# Patient Record
Sex: Male | Born: 1980 | Race: Black or African American | Hispanic: No | Marital: Single | Smoking: Current every day smoker
Health system: Southern US, Community
[De-identification: ages and names within clinical notes are randomized; demographics above are authoritative.]

## PROBLEM LIST (undated history)

## (undated) DIAGNOSIS — Y249XXA Unspecified firearm discharge, undetermined intent, initial encounter: Secondary | ICD-10-CM

## (undated) DIAGNOSIS — J45909 Unspecified asthma, uncomplicated: Secondary | ICD-10-CM

## (undated) DIAGNOSIS — J40 Bronchitis, not specified as acute or chronic: Secondary | ICD-10-CM

## (undated) DIAGNOSIS — I509 Heart failure, unspecified: Secondary | ICD-10-CM

## (undated) DIAGNOSIS — G589 Mononeuropathy, unspecified: Secondary | ICD-10-CM

## (undated) DIAGNOSIS — IMO0002 Reserved for concepts with insufficient information to code with codable children: Secondary | ICD-10-CM

## (undated) DIAGNOSIS — W3400XA Accidental discharge from unspecified firearms or gun, initial encounter: Secondary | ICD-10-CM

## (undated) HISTORY — PX: APPENDECTOMY: SHX54

## (undated) HISTORY — PX: TONSILLECTOMY: SUR1361

---

## 1998-04-10 ENCOUNTER — Encounter: Payer: Self-pay | Admitting: Emergency Medicine

## 1998-04-10 ENCOUNTER — Emergency Department (HOSPITAL_COMMUNITY): Admission: EM | Admit: 1998-04-10 | Discharge: 1998-04-10 | Payer: Self-pay | Admitting: Emergency Medicine

## 1998-08-06 ENCOUNTER — Encounter: Payer: Self-pay | Admitting: Emergency Medicine

## 1998-08-06 ENCOUNTER — Emergency Department (HOSPITAL_COMMUNITY): Admission: EM | Admit: 1998-08-06 | Discharge: 1998-08-06 | Payer: Self-pay | Admitting: Emergency Medicine

## 1998-08-08 ENCOUNTER — Emergency Department (HOSPITAL_COMMUNITY): Admission: EM | Admit: 1998-08-08 | Discharge: 1998-08-08 | Payer: Self-pay | Admitting: Emergency Medicine

## 1998-12-04 ENCOUNTER — Emergency Department (HOSPITAL_COMMUNITY): Admission: EM | Admit: 1998-12-04 | Discharge: 1998-12-04 | Payer: Self-pay | Admitting: Emergency Medicine

## 1998-12-04 ENCOUNTER — Encounter: Payer: Self-pay | Admitting: Emergency Medicine

## 2001-09-10 ENCOUNTER — Emergency Department (HOSPITAL_COMMUNITY): Admission: EM | Admit: 2001-09-10 | Discharge: 2001-09-10 | Payer: Self-pay | Admitting: Emergency Medicine

## 2006-04-04 ENCOUNTER — Emergency Department (HOSPITAL_COMMUNITY): Admission: EM | Admit: 2006-04-04 | Discharge: 2006-04-04 | Payer: Self-pay | Admitting: Emergency Medicine

## 2006-05-18 ENCOUNTER — Emergency Department (HOSPITAL_COMMUNITY): Admission: EM | Admit: 2006-05-18 | Discharge: 2006-05-18 | Payer: Self-pay | Admitting: Emergency Medicine

## 2010-07-02 ENCOUNTER — Emergency Department (HOSPITAL_COMMUNITY)
Admission: EM | Admit: 2010-07-02 | Discharge: 2010-07-02 | Disposition: A | Discharge status: Home / Self Care | Payer: Self-pay | Attending: Emergency Medicine | Admitting: Emergency Medicine

## 2010-07-02 DIAGNOSIS — M79609 Pain in unspecified limb: Secondary | ICD-10-CM | POA: Insufficient documentation

## 2010-07-02 DIAGNOSIS — R21 Rash and other nonspecific skin eruption: Secondary | ICD-10-CM | POA: Insufficient documentation

## 2010-07-02 DIAGNOSIS — L851 Acquired keratosis [keratoderma] palmaris et plantaris: Secondary | ICD-10-CM | POA: Insufficient documentation

## 2010-07-02 DIAGNOSIS — F17200 Nicotine dependence, unspecified, uncomplicated: Secondary | ICD-10-CM | POA: Insufficient documentation

## 2010-07-02 LAB — POCT I-STAT, CHEM 8
BUN: 9 mg/dL (ref 6–23)
Calcium, Ion: 1.03 mmol/L — ABNORMAL LOW (ref 1.12–1.32)
Chloride: 104 mEq/L (ref 96–112)
Creatinine, Ser: 1 mg/dL (ref 0.4–1.5)
Glucose, Bld: 107 mg/dL — ABNORMAL HIGH (ref 70–99)
HCT: 46 % (ref 39.0–52.0)
Hemoglobin: 15.6 g/dL (ref 13.0–17.0)
Potassium: 4.8 mEq/L (ref 3.5–5.1)
Sodium: 140 mEq/L (ref 135–145)
TCO2: 26 mmol/L (ref 0–100)

## 2010-07-02 LAB — RPR: RPR Ser Ql: NONREACTIVE

## 2010-07-03 LAB — ROCKY MTN SPOTTED FVR AB, IGM-BLOOD: RMSF IgM: 0.19 IV (ref 0.00–0.89)

## 2010-07-03 LAB — ROCKY MTN SPOTTED FVR AB, IGG-BLOOD: RMSF IgG: 0.29 IV

## 2010-07-04 ENCOUNTER — Observation Stay (HOSPITAL_COMMUNITY)
Admission: EM | Admit: 2010-07-04 | Discharge: 2010-07-05 | DRG: 547 | Disposition: A | Discharge status: Home Health | Payer: Self-pay | Attending: Internal Medicine | Admitting: Internal Medicine

## 2010-07-04 DIAGNOSIS — M7989 Other specified soft tissue disorders: Principal | ICD-10-CM | POA: Insufficient documentation

## 2010-07-04 DIAGNOSIS — R21 Rash and other nonspecific skin eruption: Secondary | ICD-10-CM | POA: Insufficient documentation

## 2010-07-04 DIAGNOSIS — F17200 Nicotine dependence, unspecified, uncomplicated: Secondary | ICD-10-CM | POA: Insufficient documentation

## 2010-07-04 DIAGNOSIS — J45909 Unspecified asthma, uncomplicated: Secondary | ICD-10-CM | POA: Insufficient documentation

## 2010-07-04 LAB — URINALYSIS, ROUTINE W REFLEX MICROSCOPIC
Glucose, UA: NEGATIVE mg/dL
Protein, ur: NEGATIVE mg/dL
Specific Gravity, Urine: 1.019 (ref 1.005–1.030)
pH: 5.5 (ref 5.0–8.0)

## 2010-07-04 LAB — DIC (DISSEMINATED INTRAVASCULAR COAGULATION)PANEL
Fibrinogen: 605 mg/dL — ABNORMAL HIGH (ref 204–475)
INR: 1.05 (ref 0.00–1.49)
Smear Review: NONE SEEN
aPTT: 27 seconds (ref 24–37)

## 2010-07-04 LAB — COMPREHENSIVE METABOLIC PANEL
BUN: 7 mg/dL (ref 6–23)
Calcium: 8.5 mg/dL (ref 8.4–10.5)
Creatinine, Ser: 0.9 mg/dL (ref 0.4–1.5)
Glucose, Bld: 114 mg/dL — ABNORMAL HIGH (ref 70–99)
Total Protein: 7.5 g/dL (ref 6.0–8.3)

## 2010-07-04 LAB — HEPATIC FUNCTION PANEL
AST: 34 U/L (ref 0–37)
Albumin: 3.5 g/dL (ref 3.5–5.2)
Total Bilirubin: 0.5 mg/dL (ref 0.3–1.2)

## 2010-07-04 LAB — URINE MICROSCOPIC-ADD ON

## 2010-07-04 LAB — DIFFERENTIAL
Basophils Relative: 1 % (ref 0–1)
Eosinophils Absolute: 0.2 10*3/uL (ref 0.0–0.7)
Eosinophils Relative: 2 % (ref 0–5)
Lymphs Abs: 1.8 10*3/uL (ref 0.7–4.0)
Monocytes Absolute: 0.7 10*3/uL (ref 0.1–1.0)

## 2010-07-04 LAB — RAPID URINE DRUG SCREEN, HOSP PERFORMED
Amphetamines: NOT DETECTED
Barbiturates: NOT DETECTED

## 2010-07-04 LAB — CBC
HCT: 38.9 % — ABNORMAL LOW (ref 39.0–52.0)
MCHC: 33.4 g/dL (ref 30.0–36.0)
MCV: 96.3 fL (ref 78.0–100.0)
Platelets: 323 10*3/uL (ref 150–400)
RDW: 14 % (ref 11.5–15.5)

## 2010-07-05 LAB — RPR: RPR Ser Ql: NONREACTIVE

## 2010-07-05 LAB — C-REACTIVE PROTEIN: CRP: 9.9 mg/dL — ABNORMAL HIGH (ref <0.6)

## 2010-07-05 LAB — HIV ANTIBODY (ROUTINE TESTING W REFLEX): HIV: NONREACTIVE

## 2010-07-07 LAB — HIV-1 RNA QUANT-NO REFLEX-BLD: HIV 1 RNA Quant: <20 copies/mL (ref <20)

## 2010-07-16 NOTE — H&P (Signed)
NAME:  GEORG, ANG NO.:  [id number]  MEDICAL RECORD NO.:  [id number]           PATIENT TYPE:  E  LOCATION:  [facility]                         FACILITY:  [place]  PHYSICIAN:  Alphia Moh, MD   DATE OF BIRTH:  01/18/1981  DATE OF ADMISSION:  07/04/2010 DATE OF DISCHARGE:                             HISTORY & PHYSICAL   PRIMARY CARE PHYSICIAN:  Unassigned.  CHIEF COMPLAINT:  Pain in bilateral feet and rash.  HISTORY OF PRESENT ILLNESS:  The patient is a 30 year old gentleman with past medical history only significant for asthma who presents with approximately 3-4 days of pain in bilateral feet along with the rash. He initially went to [facility] 4 days prior to admission after developing pain in the plantar surface of the left foot along with a few spots of a rash in bilateral palmar hands.  He was evaluated at the time of [facility], and had a metabolic panel along with an RPR to assess for syphilis and a rickettsia, [facility] spotted fever panel, and was sent out with Percocet and doxycycline.  The patient reports that since then symptoms have been getting worse with leading to bilateral foot pain as opposed to only the left foot and the petechial/purpuric rash is now involving both plantar surfaces of his feet as well as bilateral palms.  Upon review of systems, the patient reports having had a sore throat approximately a week prior to this happening.  Otherwise he denies any other symptoms.  Of note, he denies any genitourinary symptoms.  He is married and denies any promiscuous behavior, he denies any travel, he denies any known insect bites. However, [place] and it is possible especially since his grandmother's house apparently has some bedbugs.  PAST MEDICAL HISTORY:  Asthma.  MEDICATIONS: 1. Doxycycline 100 mg p.o. b.i.d. prescribed approximately 3 days ago. 2. Percocet 5/325 p.o. p.r.n.  pain.  SOCIAL HISTORY:  The patient is married.  He smokes a half-a-pack per day.  He denies any alcohol use.  He does use marijuana, but denies any other drug use, specifically cocaine or anything injected.  FAMILY HISTORY:  Noncontributory.  REVIEW OF SYSTEMS:  As per HPI.  All others reviewed negative.  PHYSICAL EXAMINATION:  VITAL SIGNS:  Temperature 98.4, blood pressure 160/93, now 109/75, heart rate of 116 initially, now 88, respirations 18, oxygen saturation 96-97% on room air. GENERAL:  This is a young obese man lying in bed in no acute distress. HEENT:  Head is normocephalic, atraumatic.  Pupils are equal, round, and reactive to light and accommodation.  Extraocular movements are intact. Sclerae anicteric.  Mucous membranes are moist.  There is right-sided angular cheilitis at the corner of the mouth.  There are some purulent- appearing tonsillar exudates noted. NECK:  Supple.  There is no lymphadenopathy.  There is no JVD.  There is no carotid bruits. HEART:  There is a normal S1 and S2 with a regular rate and rhythm. There is no murmurs, gallops, or rubs. LUNGS:  Good air movement bilaterally and clear to auscultation.  ABDOMEN:  Positive bowel sounds, obese, soft, nontender, nondistended. EXTREMITIES:  There is no cyanosis, clubbing, or edema.  There is very thick and calloused skin bilateral plantar surfaces with some cracks. NEUROLOGIC:  The patient is awake, alert, and oriented x3.  Cranial nerves II-XII are intact.  Motor is intact.  Sensation is intact.  SKIN: The patient has purpuric rash that is palpable in bilateral plantar surfaces as well as palmar surfaces.  There is a more fine maculopapular rash in the distal lower extremities from the knees to the ankles.  LABORATORY DATA:  Sodium 137, potassium 3.9, chloride 102, bicarb 26, glucose 114, BUN 7, creatinine 0.9, total bilirubin 1.1, alkaline phosphatase 77, AST 44, ALT 30, total protein 7.5, albumin 3.5,  calcium 8.5, WBC 8.8, hemoglobin 13.0, platelets 323 with a normal differential. PT 13.9, INR 1.05, PTT 27, D-dimer 0.325, fibrinogen is 605.  ASSESSMENT AND PLAN: 1. Purpuric rash, this is currently of unclear etiology.  The patient     has had an initial workup which I feel is unlikely to be the cause.     RPR was negative, although this may need to be further diluted to     ensure that it is negative, this is an acute infection, however, I     do not think this is actually consistent given his pain associated     with the rash.  Furthermore, it does not appear to be ricketssial-     type infection and his initial [facility] spotted fever panel     was negative.  Furthermore, his platelets are within normal limits     ruling out any platelet diathesis, moreover his mucous membranes do     not have any lesions.  At this point, we will target vasculitic     process and we will go ahead and check a sed rate and CRP (his     fibrinogen is 605 which is elevated unlikely in the setting of an     acute inflammatory process).  We will go ahead and check an ANA as     well as the ANCA for other autoimmune diseases as a potential    social vasculitis.  We will go ahead and check an IgA level     although this would be very nonspecific and be elevated in     vasculitides such as Henoch-Schonlein purpura again this is     unlikely.  We will go ahead and check an HIV antibody along with     PCR.  The patient reported a sore throat approximately a week     before symptoms began and we will go ahead and check an anti-     streptolysin O antibody to confirm that this is in post     streptococcal.  The patient's AST is slightly elevated at 44, I     doubt that this is secondary to hepatitis, but we will check an     acute hepatitis panel along with a cryoglobulins. 2. Elevated transaminases.  Again as mentioned above, AST is minimally     elevated at 44.  We will workup as per #1. 3. Elevated  fibrinogen, again this is as per #1. 4. Slightly elevated glucose, glucose level is currently 114, which is     not significantly high.  We will not pursue any further workup. 5. The patient's wife's name is Caryl Pina, and she is the one to be     contacted in case of  need for medical decision making, phone number     is [phone].     Alphia Moh, MD     MA/MEDQ  D:  07/04/2010  T:  07/04/2010  Job:  [id number]  Electronically Signed by Alphia Moh MD on 07/16/2010 10:30:43 AM

## 2012-10-10 ENCOUNTER — Emergency Department (HOSPITAL_COMMUNITY)
Admission: EM | Admit: 2012-10-10 | Discharge: 2012-10-10 | Disposition: A | Discharge status: Home / Self Care | Payer: Self-pay | Attending: Emergency Medicine | Admitting: Emergency Medicine

## 2012-10-10 ENCOUNTER — Encounter (HOSPITAL_COMMUNITY): Payer: Self-pay | Admitting: *Deleted

## 2012-10-10 DIAGNOSIS — IMO0002 Reserved for concepts with insufficient information to code with codable children: Secondary | ICD-10-CM | POA: Insufficient documentation

## 2012-10-10 DIAGNOSIS — Z87828 Personal history of other (healed) physical injury and trauma: Secondary | ICD-10-CM | POA: Insufficient documentation

## 2012-10-10 DIAGNOSIS — IMO0001 Reserved for inherently not codable concepts without codable children: Secondary | ICD-10-CM | POA: Insufficient documentation

## 2012-10-10 DIAGNOSIS — R209 Unspecified disturbances of skin sensation: Secondary | ICD-10-CM | POA: Insufficient documentation

## 2012-10-10 DIAGNOSIS — M5416 Radiculopathy, lumbar region: Secondary | ICD-10-CM

## 2012-10-10 DIAGNOSIS — F17200 Nicotine dependence, unspecified, uncomplicated: Secondary | ICD-10-CM | POA: Insufficient documentation

## 2012-10-10 MED ORDER — HYDROCODONE-ACETAMINOPHEN 5-325 MG PO TABS: 1.0000 | ORAL_TABLET | Freq: Four times a day (QID) | ORAL | Status: DC | PRN

## 2012-10-10 MED ORDER — OXYCODONE-ACETAMINOPHEN 5-325 MG PO TABS
1.0000 | ORAL_TABLET | Freq: Once | ORAL | Status: AC
  Administered 2012-10-10: 1 via ORAL
  Filled 2012-10-10: qty 1

## 2012-10-10 MED ORDER — PREDNISONE 10 MG PO TABS: ORAL_TABLET | ORAL | Status: DC

## 2012-10-10 MED ORDER — PREDNISONE 20 MG PO TABS
60.0000 mg | ORAL_TABLET | Freq: Once | ORAL | Status: AC
  Administered 2012-10-10: 60 mg via ORAL
  Filled 2012-10-10: qty 3

## 2012-10-10 MED ORDER — NAPROXEN 500 MG PO TABS: 500.0000 mg | ORAL_TABLET | Freq: Two times a day (BID) | ORAL | Status: DC

## 2012-10-10 NOTE — ED Notes (Signed)
Pt dc'd home w/all belongings, alert and ambulatory upon dc, 3 new rx prescribed, pt verbalizes understanding of dc instructions, driven home by friend

## 2012-10-10 NOTE — ED Notes (Signed)
Pt c/o Right lateral thigh pain, numbness and burning "for a couple of years." pt reports increase pain to the Right leg. 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

## 2012-10-10 NOTE — ED Notes (Signed)
The pt has had pain in his rt thigh for YEARS.  No new pain except it is getting worse

## 2012-10-10 NOTE — ED Provider Notes (Signed)
History  This chart was scribed for non-physician practitioner Jeannett Senior, PA-C, working with Richarda Blade, MD, by Neta Ehlers, ED Scribe. This patient was seen in room TR10C/TR10C and the patient's care was started at 10:30 PM.  CSN: [id number] Arrival date & time 10/10/12  2131  First MD Initiated Contact with Patient 10/10/12 2158     Chief Complaint  Patient presents with  . thigh pain  for years     The history is provided by the patient. No language interpreter was used.   HPI Comments: Joe Carter is a 32 y.o. male who presents to the Emergency Department complaining of pain to his right thigh which he has experienced for four years but has recently worsened.  He states that his thigh is numb, and that he also experiences intermittent episodes of sharp pains. He describes the pain as "burning." He denies back pain, urinary or bowel incontinency, and a fever. 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 Did not seek treatment for this.   History reviewed. No pertinent past medical history. History reviewed. No pertinent past surgical history. No family history on file. History  Substance Use Topics  . Smoking status: Current Every Day Smoker  . Smokeless tobacco: Not on file  . Alcohol Use: Yes    Review of Systems  Constitutional: Negative for fever.  Genitourinary: Negative for urgency.  Musculoskeletal: Positive for myalgias. Negative for back pain.  All other systems reviewed and are negative.    Allergies  Review of patient's allergies indicates no known allergies.  Home Medications  No current outpatient prescriptions on file.  Triage Vitals: BP 138/73  Pulse 84  Temp(Src) 98.1 F (36.7 C) (Oral)  Resp 18  SpO2 96%  Physical Exam  Nursing note and vitals reviewed. Constitutional: He is oriented to person, place, and time. He appears  well-developed and well-nourished. No distress.  HENT:  Head: Normocephalic and atraumatic.  Eyes: EOM are normal.  Neck: Neck supple. No tracheal deviation present.  Cardiovascular: Normal rate.   Pulmonary/Chest: Effort normal. No respiratory distress.  Musculoskeletal: Normal range of motion.  No midline lumbar tenderness. Noperivertebral tenderness. Normal right hip with no tenderness to palpation, full ROM. Normal right knee. Dorsal pedal pulses intact. No calf swelling or tenderness.   Neurological: He is alert and oriented to person, place, and time.  Decreased sensation over right lateral thigh. Distal sensations intact.  5/5 strength in right lower extremity. Good strength with foot dorsal flexion and plantar flexion.    Skin: Skin is warm and dry.  Psychiatric: He has a normal mood and affect. His behavior is normal.    ED Course  Procedures (including critical care time)  DIAGNOSTIC STUDIES: Oxygen Saturation is 96% on room air, normal by my interpretation.    COORDINATION OF CARE:  10:35 PM-Discussed treatment plan with patient which includes pain medication and prednisone, and the patient agreed to the plan. Also advised the pt to exercise and lose weight. Informed pt that he should find a PCP.   Labs Reviewed - No data to display No results found. 1. Lumbar radiculopathy, chronic     MDM  Pt with right buttock and thigh pain for 4 years. Exam consistent with radiculopathy. No LE swelling, no calf pain or tenderness, no pain with ROM of the hip. Doubt DVT. No numbness or weakness in distal  leg. No signs of cuada equina. Will treat with NSAIDs, prednisone, norco. Follow up with pcp.   Filed Vitals:   10/10/12 2138  BP: 138/73  Pulse: 84  Temp: 98.1 F (36.7 C)  TempSrc: Oral  Resp: 18  SpO2: 96%    I personally performed the services described in this documentation, which was scribed in my presence. The recorded information has been reviewed and is  accurate.    Renold Genta, PA-C 10/11/12 0004

## 2012-10-11 NOTE — ED Provider Notes (Signed)
Medical screening examination/treatment/procedure(s) were performed by non-physician practitioner and as supervising physician I was immediately available for consultation/collaboration.  Richarda Blade, MD 10/11/12 [phone]

## 2013-06-09 ENCOUNTER — Encounter (HOSPITAL_COMMUNITY): Payer: Self-pay | Admitting: Emergency Medicine

## 2013-06-09 ENCOUNTER — Emergency Department (HOSPITAL_COMMUNITY)
Admission: EM | Admit: 2013-06-09 | Discharge: 2013-06-09 | Disposition: A | Discharge status: Home / Self Care | Payer: BC Managed Care – PPO | Attending: Emergency Medicine | Admitting: Emergency Medicine

## 2013-06-09 DIAGNOSIS — F17200 Nicotine dependence, unspecified, uncomplicated: Secondary | ICD-10-CM | POA: Insufficient documentation

## 2013-06-09 DIAGNOSIS — R1031 Right lower quadrant pain: Secondary | ICD-10-CM | POA: Insufficient documentation

## 2013-06-09 DIAGNOSIS — Z791 Long term (current) use of non-steroidal anti-inflammatories (NSAID): Secondary | ICD-10-CM | POA: Insufficient documentation

## 2013-06-09 DIAGNOSIS — R197 Diarrhea, unspecified: Secondary | ICD-10-CM | POA: Insufficient documentation

## 2013-06-09 DIAGNOSIS — J45909 Unspecified asthma, uncomplicated: Secondary | ICD-10-CM | POA: Insufficient documentation

## 2013-06-09 DIAGNOSIS — R109 Unspecified abdominal pain: Secondary | ICD-10-CM

## 2013-06-09 HISTORY — DX: Bronchitis, not specified as acute or chronic: J40

## 2013-06-09 HISTORY — DX: Unspecified asthma, uncomplicated: J45.909

## 2013-06-09 LAB — URINALYSIS, ROUTINE W REFLEX MICROSCOPIC
BILIRUBIN URINE: NEGATIVE
GLUCOSE, UA: NEGATIVE mg/dL
HGB URINE DIPSTICK: NEGATIVE
KETONES UR: NEGATIVE mg/dL
Nitrite: NEGATIVE
PROTEIN: NEGATIVE mg/dL
Specific Gravity, Urine: 1.031 — ABNORMAL HIGH (ref 1.005–1.030)
Urobilinogen, UA: 1 mg/dL (ref 0.0–1.0)
pH: 6 (ref 5.0–8.0)

## 2013-06-09 LAB — CBC WITH DIFFERENTIAL/PLATELET
BASOS ABS: 0 10*3/uL (ref 0.0–0.1)
BASOS PCT: 0 % (ref 0–1)
EOS ABS: 0.1 10*3/uL (ref 0.0–0.7)
Eosinophils Relative: 1 % (ref 0–5)
HCT: 40.6 % (ref 39.0–52.0)
HEMOGLOBIN: 13.6 g/dL (ref 13.0–17.0)
Lymphocytes Relative: 28 % (ref 12–46)
Lymphs Abs: 2.3 10*3/uL (ref 0.7–4.0)
MCH: 32.9 pg (ref 26.0–34.0)
MCHC: 33.5 g/dL (ref 30.0–36.0)
MCV: 98.1 fL (ref 78.0–100.0)
Monocytes Absolute: 0.5 10*3/uL (ref 0.1–1.0)
Monocytes Relative: 6 % (ref 3–12)
NEUTROS ABS: 5.1 10*3/uL (ref 1.7–7.7)
NEUTROS PCT: 64 % (ref 43–77)
PLATELETS: 271 10*3/uL (ref 150–400)
RBC: 4.14 MIL/uL — ABNORMAL LOW (ref 4.22–5.81)
RDW: 13.9 % (ref 11.5–15.5)
WBC: 8 10*3/uL (ref 4.0–10.5)

## 2013-06-09 LAB — COMPREHENSIVE METABOLIC PANEL WITH GFR
ALT: 18 U/L (ref 0–53)
AST: 19 U/L (ref 0–37)
Albumin: 3.5 g/dL (ref 3.5–5.2)
Alkaline Phosphatase: 64 U/L (ref 39–117)
BUN: 9 mg/dL (ref 6–23)
CO2: 30 meq/L (ref 19–32)
Calcium: 9.2 mg/dL (ref 8.4–10.5)
Chloride: 102 meq/L (ref 96–112)
Creatinine, Ser: 0.76 mg/dL (ref 0.50–1.35)
GFR calc Af Amer: >90 mL/min (ref >90)
GFR calc non Af Amer: >90 mL/min (ref >90)
Glucose, Bld: 102 mg/dL — ABNORMAL HIGH (ref 70–99)
Potassium: 4.1 meq/L (ref 3.7–5.3)
Sodium: 143 meq/L (ref 137–147)
Total Bilirubin: 0.4 mg/dL (ref 0.3–1.2)
Total Protein: 7.3 g/dL (ref 6.0–8.3)

## 2013-06-09 LAB — URINE MICROSCOPIC-ADD ON

## 2013-06-09 NOTE — ED Notes (Signed)
Pt reports RLQ pain since this morning around groin. No n/v/d. Denies hx hernia. No problems urinating or swelling testicles. Pt is a x 4. BP 130 palpated, 97% RA, 77 HR, 16RR.

## 2013-06-09 NOTE — Discharge Instructions (Signed)
May take over the counter immodium to help with diarrhea.  Start with 1 tablet and take another if needed. Continue drinking plenty of fluids to stay hydrated. Return to the ED for new or worsening symptoms.

## 2013-06-09 NOTE — ED Provider Notes (Signed)
CSN: [id number]     Arrival date & time 06/09/13  1008 History   First MD Initiated Contact with Patient 06/09/13 1010     Chief Complaint  Patient presents with  . Abdominal Pain     (Consider location/radiation/quality/duration/timing/severity/associated sxs/prior Treatment) The history is provided by the patient and medical records.   [name], bronchitis, [name], onset this morning.  Pt states he has been having nonbloody diarrhea over the past several days. States his wife and children have also been sick with similar symptoms.  No nausea or vomiting.  He denies fever or chills.  No urinary symptoms or flank pain.  Prior abdominal surgeries notable for appendectomy.  Patient has continued eating and drinking normally without difficulty.  No recent abx use.  Vital signs stable on arrival.  Past Medical History  Diagnosis Date  . Asthma   . Bronchitis    Past Surgical History  Procedure Laterality Date  . Appendectomy     No family history on file. History  Substance Use Topics  . Smoking status: Current Every Day Smoker  . Smokeless tobacco: Not on file  . Alcohol Use: Yes    Review of Systems  Gastrointestinal: Positive for abdominal pain and diarrhea.  All other systems reviewed and are negative.    Allergies  Review of patient's allergies indicates no known allergies.  Home Medications   Current Outpatient Rx  Name  Route  Sig  Dispense  Refill  . HYDROcodone-acetaminophen (NORCO) 5-325 MG per tablet   Oral   Take 1 tablet by mouth every 6 (six) hours as needed for pain.   20 tablet   0   . naproxen (NAPROSYN) 500 MG tablet   Oral   Take 1 tablet (500 mg total) by mouth 2 (two) times daily.   30 tablet   0   . predniSONE (DELTASONE) 10 MG tablet      Take 5 tab day 1, take 4 tab day 2, take 3 tab day 3, take 2 tab day 4, and take 1 tab day 5   15 tablet   0    BP  104/70  Pulse 65  Temp(Src) 97.9 F (36.6 C) (Oral)  Resp 15  SpO2 92%  Physical Exam  Nursing note and vitals reviewed. Constitutional: He is oriented to person, place, and time. He appears well-developed and well-nourished. No distress.  Morbidly obese Lying comfortably in bed, talking on cell phone, NAD  HENT:  Head: Normocephalic and atraumatic.  Mouth/Throat: Oropharynx is clear and moist.  Eyes: Conjunctivae and EOM are normal. Pupils are equal, round, and reactive to light.  Neck: Normal range of motion. Neck supple.  Cardiovascular: Normal rate, regular rhythm and normal heart sounds.   Pulmonary/Chest: Effort normal and breath sounds normal. No respiratory distress. He has no wheezes.  Abdominal: Soft. Bowel sounds are normal. There is no tenderness. There is no guarding.  Abdomen soft, nondistended, no peritoneal signs; no hernia appreciated on exam  Musculoskeletal: Normal range of motion.  Neurological: He is alert and oriented to person, place, and time.  Skin: Skin is warm and dry. He is not diaphoretic.  Psychiatric: He has a normal mood and affect.    ED Course  Procedures (including critical care time) Labs Review Labs Reviewed  CBC WITH DIFFERENTIAL - Abnormal; Notable for the following:    RBC 4.14 (*)    All  other components within normal limits  COMPREHENSIVE METABOLIC PANEL - Abnormal; Notable for the following:    Glucose, Bld 102 (*)    All other components within normal limits  URINALYSIS, ROUTINE W REFLEX MICROSCOPIC   Imaging Review No results found.   EKG Interpretation None      MDM   Final diagnoses:  Abdominal pain   33 year old male with right lower quadrant pain associated with diarrhea. Multiple sick contacts at home with similar symptoms. He is status post appendectomy several years ago.  Patient is afebrile and overall nontoxic appearing. Will obtain basic labs.  VS stable.  Labs are reassuring. Patient remains afebrile and  nontoxic appearing. At this time I have low suspicion for acute or surgical abdomen including but not limited to small bowel distraction, bowel perforation, diverticulitis, cholecystitis.  Prior appendectomy. Patient will be discharged. Instructed he may use over-the-counter immodium as needed to help with diarrhea. Continue drinking plenty of fluids to stay hydrated. Discussed plan with pt, he acknowledged understanding and agreed with plan of care.  Return precautions advised for new or worsening symptoms.  Larene Pickett, PA-C 06/09/13 1444

## 2013-06-10 NOTE — ED Provider Notes (Signed)
Medical screening examination/treatment/procedure(s) were performed by non-physician practitioner and as supervising physician I was immediately available for consultation/collaboration.   EKG Interpretation None        Orpah Greek, MD 06/10/13 [phone]

## 2013-07-16 ENCOUNTER — Ambulatory Visit: Payer: BC Managed Care – PPO | Admitting: Internal Medicine

## 2013-08-31 ENCOUNTER — Emergency Department (HOSPITAL_COMMUNITY)
Admission: EM | Admit: 2013-08-31 | Discharge: 2013-08-31 | Disposition: A | Discharge status: Home / Self Care | Payer: BC Managed Care – PPO | Attending: Emergency Medicine | Admitting: Emergency Medicine

## 2013-08-31 ENCOUNTER — Encounter (HOSPITAL_COMMUNITY): Payer: Self-pay | Admitting: Emergency Medicine

## 2013-08-31 DIAGNOSIS — Z79899 Other long term (current) drug therapy: Secondary | ICD-10-CM | POA: Insufficient documentation

## 2013-08-31 DIAGNOSIS — IMO0002 Reserved for concepts with insufficient information to code with codable children: Secondary | ICD-10-CM | POA: Insufficient documentation

## 2013-08-31 DIAGNOSIS — Z8669 Personal history of other diseases of the nervous system and sense organs: Secondary | ICD-10-CM | POA: Insufficient documentation

## 2013-08-31 DIAGNOSIS — Z87828 Personal history of other (healed) physical injury and trauma: Secondary | ICD-10-CM | POA: Insufficient documentation

## 2013-08-31 DIAGNOSIS — J45909 Unspecified asthma, uncomplicated: Secondary | ICD-10-CM | POA: Insufficient documentation

## 2013-08-31 DIAGNOSIS — M545 Low back pain, unspecified: Secondary | ICD-10-CM

## 2013-08-31 DIAGNOSIS — F17200 Nicotine dependence, unspecified, uncomplicated: Secondary | ICD-10-CM | POA: Insufficient documentation

## 2013-08-31 HISTORY — DX: Accidental discharge from unspecified firearms or gun, initial encounter: W34.00XA

## 2013-08-31 HISTORY — DX: Unspecified firearm discharge, undetermined intent, initial encounter: Y24.9XXA

## 2013-08-31 HISTORY — DX: Mononeuropathy, unspecified: G58.9

## 2013-08-31 HISTORY — DX: Reserved for concepts with insufficient information to code with codable children: IMO0002

## 2013-08-31 MED ORDER — TRAMADOL HCL 50 MG PO TABS: 50.0000 mg | ORAL_TABLET | Freq: Four times a day (QID) | ORAL | Status: DC | PRN

## 2013-08-31 MED ORDER — PREDNISONE 20 MG PO TABS: 60.0000 mg | ORAL_TABLET | Freq: Every day | ORAL | Status: DC

## 2013-08-31 MED ORDER — METHOCARBAMOL 500 MG PO TABS: 500.0000 mg | ORAL_TABLET | Freq: Two times a day (BID) | ORAL | Status: DC
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
## 2013-08-31 NOTE — ED Notes (Signed)
Pt from work via [employer] with c/o acute on chronic lower back pain, with initial injury 4 years ago.  Pt has a hx of herniated disc and pinched nerve, today while moving a crate pt reports he felt a "pop" and then sharp pain.  Pt in NAD, ambulatory.

## 2013-08-31 NOTE — Discharge Instructions (Signed)
Take muscle relaxer as needed for pain.  Do not drive or operate heavy machinery for 4 hours after taking medication.   Emergency Department Resource Guide 1) Find a Doctor and Pay Out of Pocket Although you won't have to find out who is covered by your insurance plan, it is a good idea to ask around and get recommendations. You will then need to call the office and see if the doctor you have chosen will accept you as a new patient and what types of options they offer for patients who are self-pay. Some doctors offer discounts or will set up payment plans for their patients who do not have insurance, but you will need to ask so you aren't surprised when you get to your appointment.  2) Contact Your Local Health Department Not all health departments have doctors that can see patients for sick visits, but many do, so it is worth a call to see if yours does. If you don't know where your local health department is, you can check in your phone book. The CDC also has a tool to help you locate your state's health department, and many state websites also have listings of all of their local health departments.  3) Find a [facility] If your illness is not likely to be very severe or complicated, you may want to try a walk in clinic. These are popping up all over the country in pharmacies, drugstores, and shopping centers. They're usually staffed by nurse practitioners or physician assistants that have been trained to treat common illnesses and complaints. They're usually fairly quick and inexpensive. However, if you have serious medical issues or chronic medical problems, these are probably not your best option.  No Primary Care Doctor: - Call Health Connect at  [phone] - they can help you locate a primary care doctor that  accepts your insurance, provides certain services, etc. - Physician Referral Service- [phone]  Chronic Pain Problems: Organization         Address  Phone   Notes  [facility]  [phone] Patients need to be referred by their primary care doctor.   Medication Assistance: Organization         Address  Phone   Notes  [facility] Medication [facility] [address] [place], [place] [address] [phone] --Must be a resident of [facility] -- Must have NO insurance coverage whatsoever (no Medicaid/ Medicare, etc.) -- The pt. MUST have a primary care doctor that directs their care regularly and follows them in the community   MedAssist  [phone]   [employer]  [phone]    Agencies that provide inexpensive medical care: Organization         Address  Phone   Notes  [place]  [phone]   [facility]    [phone]   [facility] [place], [place] [address] [phone]   [place] [address] [place] [phone]   Planned Parenthood    [phone]   [facility]    [phone]   [place] and [place] [address], [place] Phone:  [phone], Fax:  [phone] Hours of Operation:  9 am - 6 pm, M-F.  Also accepts Medicaid/Medicare and self-pay.  [facility] for [place] [address], [place] Phone: [phone], Fax: [phone]. Hours of Operation:  8:30 am - 5:30 pm, M-F.  Also accepts Medicaid and self-pay.  [facility] [place] [address], [place] Phone: [phone]   [place], [place], [place] [phone], Ext. [phone] Mondays & Thursdays: 7-9 AM.  First 15 patients are seen on a first come, first serve basis.    [place] Providers:  Organization         Address  Phone   Notes  [facility] [address], [place] [phone] Also accepts self-pay patients.  [facility] [address], [place]  [phone]   [place], [address], [place] [phone]   [facility] [address] [place] [phone]   Lucianne Lei [address], [place]   [phone] Only accepts [place] Access [place] patients after they have their name applied to their card.   Self-Pay (no insurance) in [facility]:  Organization         Address  Phone   Notes  Sickle Cell Patients, [facility] [place] [phone]   [facility] [place] [phone]   [facility] [place]  [place], [place], [place] [phone]   [facility]/Dr. Osei-Bonsu  [address], [place] or [address], [place] [phone] Phone number for both [place] and [place] locations is the same.  Urgent Medical and [facility] [address], [place] [phone]   [facility] [address] [place] or [address] [phone]   [facility] [address] [place] [phone], phone; [phone], fax Sees patients 1st and 3rd Saturday of every month.  Must not qualify for public or private insurance (i.e. Medicaid, Medicare, [place] Health Choice, Veterans' Benefits)  Household income should be no more than 200% of the poverty level The clinic cannot treat you if you are pregnant or think you are pregnant  Sexually transmitted diseases are not treated at the clinic.    Dental Care: Organization         Address  Phone  Notes  [facility] Department of [facility] [place] [phone] Accepts children up to age 37 who are enrolled in [place] or [place]; pregnant women with a Medicaid card; and children who have applied for  Medicaid or [place] Health Choice, but were declined, whose parents can pay a reduced fee at time of service.  [facility] Department of [facility]  [address], [place] [phone] Accepts children up to age 26 who are enrolled in [place] or [place]; pregnant women with a Medicaid card; and children who have applied for Medicaid or [place] Health Choice, but were declined, whose parents can pay a reduced fee at time of service.  [facility] PRO[facility]  [place] [phone] Patients are seen by appointment only. Walk-ins are not accepted. [place] will see patients 43 years of age and older. Monday - Tuesday (8am-5pm) Most Wednesdays (8:30-5pm) $30 per visit, cash only  [place] Adult [employer] PROGRAM  [address], [employer] [phone])  [phone] Patients are seen by appointment only. Walk-ins are not accepted. [place] will see patients 34 years of age and older. One Wednesday Evening (Monthly: Volunteer Based).  $30 per visit, cash only  [place]  [phone] for adults; Children under age 57, call Graduate Pediatric Dentistry at [phone]. Children aged 16-14, please call [phone] to request a pediatric application.  Dental services are provided in all areas of dental care including fillings, crowns and bridges, complete and partial dentures, implants, gum treatment, root canals, and extractions. Preventive care is also provided. Treatment is provided to both adults and children. Patients are selected via a lottery and there is often a waiting list.   [facility] [address] [place]  [phone] [email or web address]   [facility] [address] [place], [place] [phone], Ext. [phone] Second and Fourth Thursday of each month, opens at 6:30 AM; Clinic ends at 9 AM.  Patients are seen on a first-come first-served basis, and a limited number  are seen during each clinic.   [facility]  [address] Hillard Danker [place], [place] [phone]   Eligibility Requirements You must have lived in [place], [place], or [place] counties for at least the last three months.   You cannot be eligible for state or federal sponsored [employer], including [employer], [place], or [employer].   You generally cannot be eligible for healthcare insurance through your employer.    How to apply: Eligibility screenings are held every Tuesday and Wednesday afternoon from 1:00 pm until 4:00 pm. You do not need an appointment for the interview!  [facility] [address] [place], [place]   [place]  [place] Department  [place]  [phone]    Behavioral Health Resources in the Community: Intensive Outpatient Programs Organization         Address  Phone  Notes  [place]. [address], [place], [place] [phone]   [facility] [address] [place], [place]   ADS: Alcohol & Drug Svcs [address], [place], [place]   [place] [address]  [place], [place] or [phone]   Substance Abuse Resources Organization         Address  Phone  Notes  Alcohol and Drug Services  [phone]   [place]  [phone]   [facility] [place]   Chinita Pester  [phone]   Residential & Outpatient Substance Abuse Program  [phone]   Psychological Services Organization         Address  Phone  Notes  [facility] [place]  [place]  [phone]   [place] [address] [place] or [phone]    Mobile Crisis Teams Organization         Address  Phone  Notes  Therapeutic  Alternatives, Mobile Crisis Care Unit  [phone]   Assertive Psychotherapeutic Services  [address] [place], [place]   [place] [address] [place] [phone]    Self-Help/Support Groups Organization         Address  Phone             Notes  [place]. of [place] - variety of support groups  [phone]  [phone] Call for more information  Narcotics Anonymous (NA), [facility] [address], [employer] [place]  2 meetings at this location   Residential Facilities manager         Address  Phone  Notes  [facility] [place],    [place]  [phone]   [facility]  [address], [place] [address], [place], [place]   [place], [place] [phone] Admissions: 8am-3pm M-F  Incentives Substance [place] [address]    [place], [place] [phone]   [facility] [address] [place], [place], [place]   [facility] [address]  [place], [place]   [facility] Programs - Intensive Outpatient [address], [place], [place]   [facility] ([place].,  [place], [place] [phone] or [phone]   [facility] (RTS) [address], [place], [place] Accepts Medicaid  [facility] [place] [address]  [place] [phone] Substance Abuse/Addiction Treatment   [facility] Organization         Address  Phone  Notes  [facility]  [phone]   Domenic Schwab, PhD [address] Arlis Porta [place], [place]   [phone] or [phone]   [place], [place] [phone]   [facility] [address] [place], [place] [phone] Insurance/Medicaid/sponsorship through [facility] and Families  [address] [place]                                    [place], [place] [phone] [place] [address] [place], [place] [phone]    Dr. Adele Schilder  [phone]   [facility] Dept. 1) [address], [place] 2) [place] 3)  [place] [address], [place] [phone]  [phone]   [place] [phone] or [phone] (After Hours)

## 2013-09-01 NOTE — ED Provider Notes (Signed)
Medical screening examination/treatment/procedure(s) were conducted as a shared visit with non-physician practitioner(s) and myself.  I personally evaluated the patient during the encounter.  Pt c/o low back pain, occasionally radiating to right leg. No new numbness/weakness. No fevers. Motor intact 5/5. rx pain. pcp follow up.   Mirna Mires, MD 09/01/13 [phone]

## 2015-01-09 ENCOUNTER — Ambulatory Visit: Payer: Self-pay

## 2015-05-16 ENCOUNTER — Emergency Department (HOSPITAL_COMMUNITY)
Admission: EM | Admit: 2015-05-16 | Discharge: 2015-05-16 | Disposition: A | Discharge status: Home / Self Care | Payer: Self-pay | Attending: Emergency Medicine | Admitting: Emergency Medicine

## 2015-05-16 ENCOUNTER — Encounter (HOSPITAL_COMMUNITY): Payer: Self-pay | Admitting: Emergency Medicine

## 2015-05-16 ENCOUNTER — Emergency Department (HOSPITAL_COMMUNITY): Payer: Self-pay

## 2015-05-16 DIAGNOSIS — R5383 Other fatigue: Secondary | ICD-10-CM

## 2015-05-16 DIAGNOSIS — J45909 Unspecified asthma, uncomplicated: Secondary | ICD-10-CM | POA: Insufficient documentation

## 2015-05-16 DIAGNOSIS — R739 Hyperglycemia, unspecified: Secondary | ICD-10-CM | POA: Insufficient documentation

## 2015-05-16 DIAGNOSIS — F17210 Nicotine dependence, cigarettes, uncomplicated: Secondary | ICD-10-CM | POA: Insufficient documentation

## 2015-05-16 DIAGNOSIS — K0889 Other specified disorders of teeth and supporting structures: Secondary | ICD-10-CM | POA: Insufficient documentation

## 2015-05-16 DIAGNOSIS — I272 Other secondary pulmonary hypertension: Secondary | ICD-10-CM | POA: Insufficient documentation

## 2015-05-16 DIAGNOSIS — Z8669 Personal history of other diseases of the nervous system and sense organs: Secondary | ICD-10-CM | POA: Insufficient documentation

## 2015-05-16 DIAGNOSIS — Z87828 Personal history of other (healed) physical injury and trauma: Secondary | ICD-10-CM | POA: Insufficient documentation

## 2015-05-16 LAB — URINALYSIS, ROUTINE W REFLEX MICROSCOPIC
Bilirubin Urine: NEGATIVE
Glucose, UA: 250 mg/dL — AB
Hgb urine dipstick: NEGATIVE
Ketones, ur: NEGATIVE mg/dL
Leukocytes, UA: NEGATIVE
NITRITE: NEGATIVE
Protein, ur: NEGATIVE mg/dL
SPECIFIC GRAVITY, URINE: 1.038 — AB (ref 1.005–1.030)
pH: 6 (ref 5.0–8.0)

## 2015-05-16 LAB — BASIC METABOLIC PANEL
Anion gap: 8 (ref 5–15)
BUN: 8 mg/dL (ref 6–20)
CO2: 30 mmol/L (ref 22–32)
Calcium: 8.7 mg/dL — ABNORMAL LOW (ref 8.9–10.3)
Chloride: 103 mmol/L (ref 101–111)
Creatinine, Ser: 0.86 mg/dL (ref 0.61–1.24)
GFR calc Af Amer: >60 mL/min (ref >60)
GLUCOSE: 142 mg/dL — AB (ref 65–99)
Potassium: 3.8 mmol/L (ref 3.5–5.1)
Sodium: 141 mmol/L (ref 135–145)

## 2015-05-16 LAB — CBC
HCT: 42.6 % (ref 39.0–52.0)
HEMOGLOBIN: 13.4 g/dL (ref 13.0–17.0)
MCH: 31.8 pg (ref 26.0–34.0)
MCHC: 31.5 g/dL (ref 30.0–36.0)
MCV: 100.9 fL — ABNORMAL HIGH (ref 78.0–100.0)
Platelets: 269 10*3/uL (ref 150–400)
RBC: 4.22 MIL/uL (ref 4.22–5.81)
RDW: 14 % (ref 11.5–15.5)
WBC: 8.9 10*3/uL (ref 4.0–10.5)

## 2015-05-16 LAB — BRAIN NATRIURETIC PEPTIDE: B Natriuretic Peptide: 36.1 pg/mL (ref 0.0–100.0)

## 2015-05-16 LAB — CBG MONITORING, ED: GLUCOSE-CAPILLARY: 102 mg/dL — AB (ref 65–99)

## 2015-05-16 LAB — I-STAT TROPONIN, ED
TROPONIN I, POC: 0 ng/mL (ref 0.00–0.08)
Troponin i, poc: 0.02 ng/mL (ref 0.00–0.08)

## 2015-05-16 MED ORDER — PENICILLIN V POTASSIUM 500 MG PO TABS: 500.0000 mg | ORAL_TABLET | Freq: Four times a day (QID) | ORAL | Status: DC

## 2015-05-16 MED ORDER — SODIUM CHLORIDE 0.9 % IV BOLUS (SEPSIS)
1000.0000 mL | Freq: Once | INTRAVENOUS | Status: AC
  Administered 2015-05-16: 1000 mL via INTRAVENOUS

## 2015-05-16 MED ORDER — IPRATROPIUM-ALBUTEROL 0.5-2.5 (3) MG/3ML IN SOLN
3.0000 mL | Freq: Once | RESPIRATORY_TRACT | Status: AC
  Administered 2015-05-16: 3 mL via RESPIRATORY_TRACT
  Filled 2015-05-16: qty 3

## 2015-05-16 NOTE — ED Notes (Addendum)
Pt states he has had increased urinary frequency and increased thirst. Pt states while standing and focusing on a fixed point on the wall "i feel a little dizziness, but less dizzy than when i first came in"

## 2015-05-16 NOTE — ED Notes (Signed)
Pt states "i can breath a bit better" following the breathing treatment.

## 2015-05-16 NOTE — ED Provider Notes (Signed)
EKG Normal sinus rhythm rate 84 Borderline short PR interval Low voltage precordial leads Normal ST-T waves No prior EKG for comparison  Dorie Rank, MD 05/16/15 [phone]
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
## 2015-05-16 NOTE — Discharge Instructions (Signed)
Read the information below.  Use the prescribed medication as directed.  Please discuss all new medications with your pharmacist.  You may return to the Emergency Department at any time for worsening condition or any new symptoms that concern you.  Please call the dentist listed above within 48 hours to schedule a close follow up appointment.  If you develop fevers, swelling in your face, difficulty swallowing or breathing, return to the ER immediately for a recheck.     Fatigue Fatigue is feeling tired all of the time, a lack of energy, or a lack of motivation. Occasional or mild fatigue is often a normal response to activity or life in general. However, long-lasting (chronic) or extreme fatigue may indicate an underlying medical condition. HOME CARE INSTRUCTIONS  Watch your fatigue for any changes. The following actions may help to lessen any discomfort you are feeling:  Talk to your health care provider about how much sleep you need each night. Try to get the required amount every night.  Take medicines only as directed by your health care provider.  Eat a healthy and nutritious diet. Ask your health care provider if you need help changing your diet.  Drink enough fluid to keep your urine clear or pale yellow.  Practice ways of relaxing, such as yoga, meditation, massage therapy, or acupuncture.  Exercise regularly.   Change situations that cause you stress. Try to keep your work and personal routine reasonable.  Do not abuse illegal drugs.  Limit alcohol intake to no more than 1 drink per day for nonpregnant women and 2 drinks per day for men. One drink equals 12 ounces of beer, 5 ounces of wine, or 1 ounces of hard liquor.  Take a multivitamin, if directed by your health care provider. SEEK MEDICAL CARE IF:   Your fatigue does not get better.  You have a fever.   You have unintentional weight loss or gain.  You have headaches.   You have difficulty:   Falling  asleep.  Sleeping throughout the night.  You feel angry, guilty, anxious, or sad.   You are unable to have a bowel movement (constipation).   You skin is dry.   Your legs or another part of your body is swollen.  SEEK IMMEDIATE MEDICAL CARE IF:   You feel confused.   Your vision is blurry.  You feel faint or pass out.   You have a severe headache.   You have severe abdominal, pelvic, or back pain.   You have chest pain, shortness of breath, or an irregular or fast heartbeat.   You are unable to urinate or you urinate less than normal.   You develop abnormal bleeding, such as bleeding from the rectum, vagina, nose, lungs, or nipples.  You vomit blood.   You have thoughts about harming yourself or committing suicide.   You are worried that you might harm someone else.    This information is not intended to replace advice given to you by your health care provider. Make sure you discuss any questions you have with your health care provider.   Document Released: 01/06/2007 Document Revised: 04/01/2014 Document Reviewed: 07/13/2013 Elsevier Interactive Patient Education 2016 [place].  Hyperglycemia Hyperglycemia occurs when the glucose (sugar) in your blood is too high. Hyperglycemia can happen for many reasons, but it most often happens to people who do not know they have diabetes or are not managing their diabetes properly.  CAUSES  Whether you have diabetes or not, there are  other causes of hyperglycemia. Hyperglycemia can occur when you have diabetes, but it can also occur in other situations that you might not be as aware of, such as: Diabetes  If you have diabetes and are having problems controlling your blood glucose, hyperglycemia could occur because of some of the following reasons:  Not following your meal plan.  Not taking your diabetes medications or not taking it properly.  Exercising less or doing less activity than you normally  do.  Being sick. Pre-diabetes  This cannot be ignored. Before people develop Type 2 diabetes, they almost always have "pre-diabetes." This is when your blood glucose levels are higher than normal, but not yet high enough to be diagnosed as diabetes. Research has shown that some long-term damage to the body, especially the heart and circulatory system, may already be occurring during pre-diabetes. If you take action to manage your blood glucose when you have pre-diabetes, you may delay or prevent Type 2 diabetes from developing. Stress  If you have diabetes, you may be "diet" controlled or on oral medications or insulin to control your diabetes. However, you may find that your blood glucose is higher than usual in the hospital whether you have diabetes or not. This is often referred to as "stress hyperglycemia." Stress can elevate your blood glucose. This happens because of hormones put out by the body during times of stress. If stress has been the cause of your high blood glucose, it can be followed regularly by your caregiver. That way he/she can make sure your hyperglycemia does not continue to get worse or progress to diabetes. Steroids  Steroids are medications that act on the infection fighting system (immune system) to block inflammation or infection. One side effect can be a rise in blood glucose. Most people can produce enough extra insulin to allow for this rise, but for those who cannot, steroids make blood glucose levels go even higher. It is not unusual for steroid treatments to "uncover" diabetes that is developing. It is not always possible to determine if the hyperglycemia will go away after the steroids are stopped. A special blood test called an A1c is sometimes done to determine if your blood glucose was elevated before the steroids were started. SYMPTOMS  Thirsty.  Frequent urination.  Dry mouth.  Blurred vision.  Tired or fatigue.  Weakness.  Sleepy.  Tingling in feet  or leg. DIAGNOSIS  Diagnosis is made by monitoring blood glucose in one or all of the following ways:  A1c test. This is a chemical found in your blood.  Fingerstick blood glucose monitoring.  Laboratory results. TREATMENT  First, knowing the cause of the hyperglycemia is important before the hyperglycemia can be treated. Treatment may include, but is not be limited to:  Education.  Change or adjustment in medications.  Change or adjustment in meal plan.  Treatment for an illness, infection, etc.  More frequent blood glucose monitoring.  Change in exercise plan.  Decreasing or stopping steroids.  Lifestyle changes. HOME CARE INSTRUCTIONS   Test your blood glucose as directed.  Exercise regularly. Your caregiver will give you instructions about exercise. Pre-diabetes or diabetes which comes on with stress is helped by exercising.  Eat wholesome, balanced meals. Eat often and at regular, fixed times. Your caregiver or nutritionist will give you a meal plan to guide your sugar intake.  Being at an ideal weight is important. If needed, losing as little as 10 to 15 pounds may help improve blood glucose levels. [place]  CARE IF:   You have questions about medicine, activity, or diet.  You continue to have symptoms (problems such as increased thirst, urination, or weight gain). SEEK IMMEDIATE MEDICAL CARE IF:   You are vomiting or have diarrhea.  Your breath smells fruity.  You are breathing faster or slower.  You are very sleepy or incoherent.  You have numbness, tingling, or pain in your feet or hands.  You have chest pain.  Your symptoms get worse even though you have been following your caregiver's orders.  If you have any other questions or concerns.   This information is not intended to replace advice given to you by your health care provider. Make sure you discuss any questions you have with your health care provider.   Document Released: 09/04/2000  Document Revised: 06/03/2011 Document Reviewed: 11/15/2014 Elsevier Interactive Patient Education 2016 [employer].    Emergency Department Resource Guide 1) Find a Doctor and Pay Out of Pocket Although you won't have to find out who is covered by your insurance plan, it is a good idea to ask around and get recommendations. You will then need to call the office and see if the doctor you have chosen will accept you as a new patient and what types of options they offer for patients who are self-pay. Some doctors offer discounts or will set up payment plans for their patients who do not have insurance, but you will need to ask so you aren't surprised when you get to your appointment.  2) Contact Your Local Health Department Not all health departments have doctors that can see patients for sick visits, but many do, so it is worth a call to see if yours does. If you don't know where your local health department is, you can check in your phone book. The CDC also has a tool to help you locate your state's health department, and many state websites also have listings of all of their local health departments.  3) Find a [facility] If your illness is not likely to be very severe or complicated, you may want to try a walk in clinic. These are popping up all over the country in pharmacies, drugstores, and shopping centers. They're usually staffed by nurse practitioners or physician assistants that have been trained to treat common illnesses and complaints. They're usually fairly quick and inexpensive. However, if you have serious medical issues or chronic medical problems, these are probably not your best option.  No Primary Care Doctor: - Call Health Connect at  [phone] - they can help you locate a primary care doctor that  accepts your insurance, provides certain services, etc. - Physician Referral Service- [phone]  Chronic Pain Problems: Organization         Address  Phone   Notes  [facility]  [phone] Patients need to be referred by their primary care doctor.   Medication Assistance: Organization         Address  Phone   Notes  [facility] Medication [facility] [place] [place], [place] [address] [phone] --Must be a resident of [facility] -- Must have NO insurance coverage whatsoever (no Medicaid/ Medicare, etc.) -- The pt. MUST have a primary care doctor that directs their care regularly and follows them in the community   MedAssist  [phone]   [employer]  [phone]    Agencies that provide inexpensive medical care: Organization  Address  Phone   Notes  [place]  [phone]   [facility]    [phone]   [facility] [place], Cadan Maggart [place] [address] [phone]   [place] [address] [place] [place] [phone]   Planned Parenthood    [phone]   [facility]    [phone]   [place] and [place] [address], [place] Phone:  [phone], Fax:  [phone] Hours of Operation:  9 am - 6 pm, M-F.  Also accepts Medicaid/Medicare and self-pay.  [facility] for [address], [address], [place] Phone: [phone], Fax: [phone]. Hours of Operation:  8:30 am - 5:30 pm, M-F.  Also accepts Medicaid and self-pay.  [facility] [place] [address] [place] Phone: [phone]   [place], [place], [place] [phone], Ext. [phone] Mondays & Thursdays: 7-9 AM.  First 15 patients are seen on a first come, first serve basis.    [place]ie Providers:  Organization         Address  Phone   Notes  [facility] [address], [place] [phone] Also accepts self-pay patients.  [facility] [address], [place]  [phone]   [place], [address], [place] [phone]   [facility] [address] [place] [phone]   Lucianne Lei [address], [place]   [phone] Only accepts [place] Access [place] patients after they have their name applied to their card.   Self-Pay (no insurance) in [facility]:  Organization         Address  Phone   Notes  Sickle Cell Patients, [facility] Internal Medicine [place] [phone]   [facility] [place] [phone]   [facility] [place]  [place], [place],  [phone]   [facility]/Dr. Osei-Bonsu  [address], [place] or [address], [place] [phone] Phone number for both [place] and [place] locations is the same.  Urgent Medical and [facility] [address] [place] [phone]   [facility] [address] [place] or [address] [phone]   [facility] [address], [place] [phone], phone; [phone], fax Sees patients 1st and 3rd Saturday of every month.  Must not qualify for public or private insurance (i.e. Medicaid, Medicare, [place] Health Choice, Veterans' Benefits)  Household income should be no more than 200% of the poverty level The clinic cannot treat you if you are pregnant or think you are pregnant  Sexually transmitted diseases are not treated at the clinic.    Dental Care: Organization         Address  Phone  Notes  [facility] Department of [facility] [place] [phone] Accepts children up to age 50 who are enrolled in [place] or [place]; pregnant women with a Medicaid card; and children who have applied for  Medicaid or [place] Health Choice, but were declined, whose parents can pay a reduced fee at time  of service.  [facility] Department of [facility]  [address], [place] [phone] Accepts children up to age 62 who are enrolled in [place] or [place]; pregnant women with a Medicaid card; and children who have applied for Medicaid or [place] Health Choice, but were declined, whose parents can pay a reduced fee at time of service.  [place] Adult Dental Access PROGRAM  [place] [phone] Patients are seen by appointment only. Walk-ins are not accepted. [place] will see patients 41 years of age and older. Monday - Tuesday (8am-5pm) Most Wednesdays (8:30-5pm) $30 per visit, cash only  [facility] Adult Dental Access PROGRAM  [address], [facility] [phone] Patients are seen by appointment only. Walk-ins are not accepted. [place] will see patients 26 years of age and older. One Wednesday Evening (Monthly: Volunteer Based).  $30 per visit, cash only  [place]  [phone] for adults; Children under age 71, call Graduate Pediatric Dentistry at [phone]. Children aged 68-14, please call [phone] to request a pediatric application.  Dental services are provided in all areas of dental care including fillings, crowns and bridges, complete and partial dentures, implants, gum treatment, root canals, and extractions. Preventive care is also provided. Treatment is provided to both adults and children. Patients are selected via a lottery and there is often a waiting list.   [facility] [address] [place]  [phone] [email or web address]   [facility] [address] [place], [place] [phone], Ext. [phone] Second and Fourth Thursday of each month, opens at 6:30 AM; Clinic ends at 9 AM.  Patients are seen on a first-come first-served basis, and a limited number  are seen during each clinic.   [facility]  [address] Hillard Danker [place], [place] [phone]   Eligibility Requirements You must have lived in [place], [place], or [place] counties for at least the last three months.   You cannot be eligible for state or federal sponsored [employer], including [employer], [place], or [employer].   You generally cannot be eligible for healthcare insurance through your employer.    How to apply: Eligibility screenings are held every Tuesday and Wednesday afternoon from 1:00 pm until 4:00 pm. You do not need an appointment for the interview!  [facility] [address] [place], [place]   [place]  [place] Department  [place]  [phone]    Behavioral Health Resources in the Community: Intensive Outpatient Programs Organization         Address  Phone  Notes  Numidia [place]. [address] [place], [place] [phone]   [facility] Outpatient [address] [place], [place]   ADS: Alcohol & Drug Svcs [address], [place], [place]   [place] [address]  [place], [place] or [phone]   Substance Abuse Resources Organization         Address  Phone  Notes  Al[facility] Drug Services  [phone]   [place]  [phone]   [place]  [phone]   Chinita Pester  [phone]   Residential & Outpatient Substance Abuse Program  [phone]   Psychological Services Organization         Address  Phone  Notes  [facility]  [place]   [place] [address], [place] or [phone]    Mobile Crisis Teams Organization         Address  Phone  Notes  Therapeutic  Alternatives, Mobile Crisis Care Unit  [phone]   Assertive Psychotherapeutic Services  [address] [place], [place]   [place] Levels [address] [place] [phone]    Self-Help/Support Groups Organization         Address  Phone             Notes  [place]. of [place] - variety of support groups  [place] Call for more information  Narcotics Anonymous (NA), [facility] [address], [employer] [place]  2 meetings at this location   Special educational needs teacher         Address  Phone  Notes  [facility] Residential Treatment [place],    [place]  [phone]   [facility]  [address] [place] [address], [place], [place]   [place], [place] [phone] Admissions: 8am-3pm M-F  Incentives Substance [place] [address]    [place], [place] [phone]   [facility] [address] [place], [place], [place]   [facility] [address]  [place], [place]   Insight Programs - Intensive Outpatient [address], [place], [place]   [facility] [place].,  [place], [place] [phone] or [phone]   [facility]idential Treatment Services (RTS) [address], [place], [place] Accepts Medicaid  [facility] [place] [address]  [place] [phone] Substance Abuse/Addiction Treatment   [facility] Organization         Address  Phone  Notes  [facility]  [phone]   Domenic Schwab, PhD [address] Arlis [place], [place]   [phone] or [phone]   U[place]es [place], [place] [phone]   [facility] [address] [place], [place] [phone] Insurance/Medicaid/sponsorship through [facility] and Families  [address] [place]                                    [place], [place] [phone] [place] [address], [place] [phone]    Dr. Adele Schilder  [phone]   [facility] [place] Dept. 1) [address] [place] 2) [place] 3)  [place] [address], [place] [phone]  [phone]   [place] [phone] or [phone] (After Hours)

## 2015-05-16 NOTE — ED Notes (Signed)
Per EMS-states woke up dizzy and weak-went to work and then called EMS-only at work for an hour

## 2015-05-17 ENCOUNTER — Telehealth: Payer: Self-pay | Admitting: *Deleted

## 2015-06-08 ENCOUNTER — Telehealth: Payer: Self-pay | Admitting: *Deleted

## 2015-08-02 ENCOUNTER — Telehealth: Payer: Self-pay | Admitting: General Practice

## 2015-08-02 ENCOUNTER — Ambulatory Visit: Payer: Self-pay | Admitting: Internal Medicine

## 2015-08-02 NOTE — Telephone Encounter (Signed)
APT. REMINDER CALL, LMTCB

## 2015-08-03 ENCOUNTER — Ambulatory Visit: Payer: Self-pay | Admitting: Internal Medicine

## 2016-01-28 ENCOUNTER — Encounter (HOSPITAL_COMMUNITY): Payer: Self-pay | Admitting: *Deleted

## 2016-01-28 ENCOUNTER — Emergency Department (HOSPITAL_COMMUNITY)
Admission: EM | Admit: 2016-01-28 | Discharge: 2016-01-28 | Disposition: A | Discharge status: Home / Self Care | Payer: Self-pay | Attending: Emergency Medicine | Admitting: Emergency Medicine

## 2016-01-28 DIAGNOSIS — R42 Dizziness and giddiness: Secondary | ICD-10-CM | POA: Insufficient documentation

## 2016-01-28 DIAGNOSIS — F17210 Nicotine dependence, cigarettes, uncomplicated: Secondary | ICD-10-CM | POA: Insufficient documentation

## 2016-01-28 DIAGNOSIS — J45909 Unspecified asthma, uncomplicated: Secondary | ICD-10-CM | POA: Insufficient documentation

## 2016-01-28 LAB — CBC WITH DIFFERENTIAL/PLATELET
Basophils Absolute: 0 10*3/uL (ref 0.0–0.1)
Basophils Relative: 0 %
EOS ABS: 0.2 10*3/uL (ref 0.0–0.7)
EOS PCT: 2 %
HCT: 40.3 % (ref 39.0–52.0)
HEMOGLOBIN: 13 g/dL (ref 13.0–17.0)
LYMPHS ABS: 2.4 10*3/uL (ref 0.7–4.0)
Lymphocytes Relative: 28 %
MCH: 31.5 pg (ref 26.0–34.0)
MCHC: 32.3 g/dL (ref 30.0–36.0)
MCV: 97.6 fL (ref 78.0–100.0)
Monocytes Absolute: 0.3 10*3/uL (ref 0.1–1.0)
Monocytes Relative: 4 %
Neutro Abs: 5.5 10*3/uL (ref 1.7–7.7)
Neutrophils Relative %: 66 %
Platelets: 279 10*3/uL (ref 150–400)
RBC: 4.13 MIL/uL — ABNORMAL LOW (ref 4.22–5.81)
RDW: 14 % (ref 11.5–15.5)
WBC: 8.5 10*3/uL (ref 4.0–10.5)

## 2016-01-28 LAB — COMPREHENSIVE METABOLIC PANEL
ALBUMIN: 3.1 g/dL — AB (ref 3.5–5.0)
ALK PHOS: 68 U/L (ref 38–126)
ALT: 17 U/L (ref 17–63)
AST: 20 U/L (ref 15–41)
Anion gap: 10 (ref 5–15)
BUN: 5 mg/dL — ABNORMAL LOW (ref 6–20)
CALCIUM: 8.8 mg/dL — AB (ref 8.9–10.3)
CO2: 27 mmol/L (ref 22–32)
CREATININE: 0.93 mg/dL (ref 0.61–1.24)
Chloride: 101 mmol/L (ref 101–111)
GFR calc Af Amer: >60 mL/min (ref >60)
GFR calc non Af Amer: >60 mL/min (ref >60)
GLUCOSE: 141 mg/dL — AB (ref 65–99)
Potassium: 3.6 mmol/L (ref 3.5–5.1)
SODIUM: 138 mmol/L (ref 135–145)
Total Bilirubin: 0.5 mg/dL (ref 0.3–1.2)
Total Protein: 6.9 g/dL (ref 6.5–8.1)

## 2016-01-28 LAB — LIPASE, BLOOD: Lipase: 26 U/L (ref 11–51)

## 2016-01-28 MED ORDER — ONDANSETRON HCL 4 MG/2ML IJ SOLN
4.0000 mg | Freq: Once | INTRAMUSCULAR | Status: AC
  Administered 2016-01-28: 4 mg via INTRAVENOUS
  Filled 2016-01-28: qty 2

## 2016-01-28 MED ORDER — MECLIZINE HCL 25 MG PO TABS
25.0000 mg | ORAL_TABLET | Freq: Once | ORAL | Status: AC
  Administered 2016-01-28: 25 mg via ORAL
  Filled 2016-01-28: qty 1

## 2016-01-28 MED ORDER — SODIUM CHLORIDE 0.9 % IV BOLUS (SEPSIS)
1000.0000 mL | Freq: Once | INTRAVENOUS | Status: AC
Start: 2016-01-28 — End: 2016-01-28
  Administered 2016-01-28: 1000 mL via INTRAVENOUS

## 2016-01-28 MED ORDER — MECLIZINE HCL 25 MG PO TABS: 25.0000 mg | ORAL_TABLET | Freq: Three times a day (TID) | ORAL | 0 refills | Status: DC | PRN

## 2016-01-28 NOTE — Discharge Instructions (Signed)
Please follow up with the wellness center and have your blood pressure rechecked.
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
## 2016-01-28 NOTE — ED Triage Notes (Signed)
Pt reports dizziness x 2 days. Having n/v today. reports feeling like room is spinning. Denies hx of vertigo.

## 2016-08-01 ENCOUNTER — Encounter (INDEPENDENT_AMBULATORY_CARE_PROVIDER_SITE_OTHER): Payer: Self-pay

## 2016-08-01 ENCOUNTER — Ambulatory Visit (INDEPENDENT_AMBULATORY_CARE_PROVIDER_SITE_OTHER): Payer: Self-pay | Admitting: Internal Medicine

## 2016-08-01 ENCOUNTER — Encounter: Payer: Self-pay | Admitting: Internal Medicine

## 2016-08-01 DIAGNOSIS — K029 Dental caries, unspecified: Secondary | ICD-10-CM

## 2016-08-01 DIAGNOSIS — R030 Elevated blood-pressure reading, without diagnosis of hypertension: Secondary | ICD-10-CM

## 2016-08-01 DIAGNOSIS — I10 Essential (primary) hypertension: Secondary | ICD-10-CM | POA: Insufficient documentation

## 2016-08-01 DIAGNOSIS — M545 Low back pain: Secondary | ICD-10-CM

## 2016-08-01 DIAGNOSIS — K0889 Other specified disorders of teeth and supporting structures: Secondary | ICD-10-CM

## 2016-08-01 DIAGNOSIS — Z833 Family history of diabetes mellitus: Secondary | ICD-10-CM

## 2016-08-01 DIAGNOSIS — F17210 Nicotine dependence, cigarettes, uncomplicated: Secondary | ICD-10-CM

## 2016-08-01 DIAGNOSIS — G8929 Other chronic pain: Secondary | ICD-10-CM

## 2016-08-01 DIAGNOSIS — Z8249 Family history of ischemic heart disease and other diseases of the circulatory system: Secondary | ICD-10-CM

## 2016-08-01 DIAGNOSIS — Z841 Family history of disorders of kidney and ureter: Secondary | ICD-10-CM

## 2016-08-01 DIAGNOSIS — M549 Dorsalgia, unspecified: Secondary | ICD-10-CM | POA: Insufficient documentation

## 2016-08-01 DIAGNOSIS — Z597 Insufficient social insurance and welfare support: Secondary | ICD-10-CM

## 2016-08-01 DIAGNOSIS — G629 Polyneuropathy, unspecified: Secondary | ICD-10-CM | POA: Insufficient documentation

## 2016-08-01 HISTORY — DX: Dental caries, unspecified: K02.9

## 2016-08-01 LAB — POCT GLYCOSYLATED HEMOGLOBIN (HGB A1C): HEMOGLOBIN A1C: 5.9

## 2016-08-01 LAB — GLUCOSE, CAPILLARY: Glucose-Capillary: 102 mg/dL — ABNORMAL HIGH (ref 65–99)

## 2016-08-01 MED ORDER — GABAPENTIN 300 MG PO CAPS: 300.0000 mg | ORAL_CAPSULE | Freq: Three times a day (TID) | ORAL | 2 refills | Status: DC
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
## 2016-08-01 NOTE — Assessment & Plan Note (Signed)
Blood pressure today is elevated today 146/82. Patient does not have a history of hypertension.  -- F/u 1 month for BP recheck

## 2016-08-01 NOTE — Assessment & Plan Note (Addendum)
Patient is complaining of chronic feet pain that feels like a "pins and needles" and burning sensation. The pain involves his entire bilateral feet but not his legs. Patient's pain description is most consistent with a neuropathic type pain. Patient is currently uninsured without the orange card liberating our options for therapy. He is morbidly obese with a family history of diabetes. We will check some basic lab work today and start gabapentin on the [facility] $4 list.  -- A1c, Vit B12, Folate, TSH, RPR -- Start gabapentin 300 mg TID -- F/u 1 month   ADDENDUM: A1c mildly elevated, 5.9. All other labs within normal limits. Nothing to explain his neuropathy symptoms. Called patient and updated him with results. Explained that he is considered a "pre-diabetic" and encouraged him to diet and exercise in order to avoid starting medication. He expressed understanding. He has not yet picked up his gabapentin.

## 2016-08-01 NOTE — Progress Notes (Signed)
Internal Medicine Clinic Attending  Case discussed with Dr. Guilloud at the time of the visit.  We reviewed the resident's history and exam and pertinent patient test results.  I agree with the assessment, diagnosis, and plan of care documented in the resident's note.  

## 2016-08-01 NOTE — Assessment & Plan Note (Signed)
Patient is complaining of chronic lower back pain that sometimes radiates down his right leg. He was previously incarcerated and reports hurting his back doing heavy lifting in jail. He reports that he was evaluated here at [facility] and told that he had a herniated disc. He has never had surgery on his back. On chart review I do not see any prior imaging of his back. He is currently uninsured without the orange card. We are limited today in what we can offer for therapy and workup. He is planning to meet with Bonna Gains for information on applying for the orange card.  -- Orange card application -- Ibuprofen for pain -- Consider imaging at next visit, PT referral

## 2016-08-01 NOTE — Patient Instructions (Addendum)
Joe Carter,  It was a pleasure to see you today. I will call you with the results of your blood work. For your feet pain, I have sent a prescription to [facility] for gabapentin. Please take this medication three times daily. Please return to clinic for follow up in 1 month. Once you are set up with the orange care, we can send you to a dentist for your teeth. If you have any questions or concerns, call our clinic at [phone] or after hours call [phone] and ask for the internal medicine resident on call. Thank you!   - Dr. Philipp Ovens

## 2016-08-01 NOTE — Assessment & Plan Note (Signed)
Patient is complaining of acute on chronic tooth pain. On exam his right bottom, second molar is decayed and partially broken off. I do not see any evidence of infection or abscess. He reports his tooth has been this way for "awhile", but the pain has been gradually worsening over the past few week.  -- Dental referral once approved for the orange card, patient will need tooth extraction

## 2016-08-02 LAB — TSH: TSH: 2.49 u[IU]/mL (ref 0.450–4.500)

## 2016-08-02 LAB — FOLATE RBC
FOLATE, HEMOLYSATE: 297.9 ng/mL
Folate, RBC: 747 ng/mL (ref >498)
Hematocrit: 39.9 % (ref 37.5–51.0)

## 2016-08-02 LAB — RPR: RPR Ser Ql: NONREACTIVE

## 2016-08-02 LAB — VITAMIN B12: VITAMIN B 12: 356 pg/mL (ref 232–1245)

## 2016-08-14 ENCOUNTER — Telehealth: Payer: Self-pay

## 2016-08-14 DIAGNOSIS — G629 Polyneuropathy, unspecified: Secondary | ICD-10-CM

## 2016-08-14 MED ORDER — GABAPENTIN 300 MG PO CAPS: 300.0000 mg | ORAL_CAPSULE | Freq: Three times a day (TID) | ORAL | 2 refills | Status: DC

## 2016-08-14 NOTE — Telephone Encounter (Signed)
Pt states gabapentin (NEURONTIN) 300 MG capsule is not [facility]. Requesting to speak with a nurse. Please call pt back.

## 2016-08-14 NOTE — Telephone Encounter (Signed)
Patient states was over $20 at [facility], would prefer to transfer to [facility] $12.

## 2016-08-27 ENCOUNTER — Ambulatory Visit: Payer: Self-pay

## 2017-04-17 ENCOUNTER — Other Ambulatory Visit: Payer: Self-pay

## 2017-04-17 ENCOUNTER — Emergency Department (HOSPITAL_COMMUNITY): Payer: Self-pay

## 2017-04-17 ENCOUNTER — Encounter (HOSPITAL_COMMUNITY): Payer: Self-pay | Admitting: Emergency Medicine

## 2017-04-17 ENCOUNTER — Emergency Department (HOSPITAL_COMMUNITY)
Admission: EM | Admit: 2017-04-17 | Discharge: 2017-04-18 | Disposition: A | Discharge status: Home / Self Care | Payer: Self-pay | Attending: Physician Assistant | Admitting: Physician Assistant

## 2017-04-17 DIAGNOSIS — F17200 Nicotine dependence, unspecified, uncomplicated: Secondary | ICD-10-CM

## 2017-04-17 DIAGNOSIS — F17210 Nicotine dependence, cigarettes, uncomplicated: Secondary | ICD-10-CM | POA: Insufficient documentation

## 2017-04-17 DIAGNOSIS — R0602 Shortness of breath: Secondary | ICD-10-CM | POA: Insufficient documentation

## 2017-04-17 DIAGNOSIS — J45909 Unspecified asthma, uncomplicated: Secondary | ICD-10-CM | POA: Insufficient documentation

## 2017-04-17 LAB — CBC
HCT: 42.3 % (ref 39.0–52.0)
HEMOGLOBIN: 13.4 g/dL (ref 13.0–17.0)
MCH: 31.7 pg (ref 26.0–34.0)
MCHC: 31.7 g/dL (ref 30.0–36.0)
MCV: 100 fL (ref 78.0–100.0)
Platelets: 231 10*3/uL (ref 150–400)
RBC: 4.23 MIL/uL (ref 4.22–5.81)
RDW: 14.2 % (ref 11.5–15.5)
WBC: 7.3 10*3/uL (ref 4.0–10.5)

## 2017-04-17 LAB — BASIC METABOLIC PANEL
ANION GAP: 12 (ref 5–15)
BUN: 6 mg/dL (ref 6–20)
CALCIUM: 8.7 mg/dL — AB (ref 8.9–10.3)
CO2: 26 mmol/L (ref 22–32)
Chloride: 100 mmol/L — ABNORMAL LOW (ref 101–111)
Creatinine, Ser: 1.02 mg/dL (ref 0.61–1.24)
GFR calc non Af Amer: >60 mL/min (ref >60)
Glucose, Bld: 93 mg/dL (ref 65–99)
Potassium: 3.4 mmol/L — ABNORMAL LOW (ref 3.5–5.1)
Sodium: 138 mmol/L (ref 135–145)

## 2017-04-17 LAB — I-STAT TROPONIN, ED
TROPONIN I, POC: 0 ng/mL (ref 0.00–0.08)
Troponin i, poc: 0 ng/mL (ref 0.00–0.08)

## 2017-04-17 LAB — BRAIN NATRIURETIC PEPTIDE: B NATRIURETIC PEPTIDE 5: 15.7 pg/mL (ref 0.0–100.0)

## 2017-04-17 LAB — D-DIMER, QUANTITATIVE (NOT AT ARMC): D DIMER QUANT: 0.27 ug{FEU}/mL (ref 0.00–0.50)

## 2017-04-17 MED ORDER — IPRATROPIUM BROMIDE 0.02 % IN SOLN
0.5000 mg | Freq: Once | RESPIRATORY_TRACT | Status: AC
  Administered 2017-04-17: 0.5 mg via RESPIRATORY_TRACT
  Filled 2017-04-17: qty 2.5

## 2017-04-17 MED ORDER — METHYLPREDNISOLONE SODIUM SUCC 125 MG IJ SOLR
125.0000 mg | Freq: Once | INTRAMUSCULAR | Status: DC
  Filled 2017-04-17: qty 2

## 2017-04-17 MED ORDER — IOPAMIDOL (ISOVUE-370) INJECTION 76%
INTRAVENOUS | Status: AC
  Filled 2017-04-17: qty 100

## 2017-04-17 MED ORDER — PREDNISONE 20 MG PO TABS
60.0000 mg | ORAL_TABLET | Freq: Once | ORAL | Status: AC
  Administered 2017-04-17: 60 mg via ORAL
  Filled 2017-04-17: qty 3

## 2017-04-17 MED ORDER — IOPAMIDOL (ISOVUE-370) INJECTION 76%
100.0000 mL | Freq: Once | INTRAVENOUS | Status: AC | PRN
  Administered 2017-04-17: 100 mL via INTRAVENOUS

## 2017-04-17 MED ORDER — ALBUTEROL SULFATE (2.5 MG/3ML) 0.083% IN NEBU
5.0000 mg | INHALATION_SOLUTION | Freq: Once | RESPIRATORY_TRACT | Status: AC
Start: 2017-04-17 — End: 2017-04-17
  Administered 2017-04-17: 5 mg via RESPIRATORY_TRACT
  Filled 2017-04-17: qty 6

## 2017-04-17 MED ORDER — IPRATROPIUM-ALBUTEROL 0.5-2.5 (3) MG/3ML IN SOLN
3.0000 mL | Freq: Once | RESPIRATORY_TRACT | Status: AC
Start: 2017-04-17 — End: 2017-04-17
  Administered 2017-04-17: 3 mL via RESPIRATORY_TRACT
  Filled 2017-04-17: qty 3

## 2017-04-17 NOTE — ED Notes (Signed)
Pulse oximetry was checked while ambulating patient, patient O2 maintained between 95-96% on room air.

## 2017-04-17 NOTE — ED Notes (Signed)
ED Provider at bedside.

## 2017-04-17 NOTE — ED Notes (Signed)
ED Provider at bedside. 

## 2017-04-17 NOTE — ED Notes (Signed)
Lab to add on d-dimer

## 2017-04-17 NOTE — ED Notes (Signed)
Lab work, radiology results and vital signs reviewed, no critical results at this time, no change in acuity indicated.

## 2017-04-17 NOTE — ED Notes (Signed)
Patient returned from CT

## 2017-04-17 NOTE — ED Notes (Signed)
IV team at bedside.

## 2017-04-17 NOTE — ED Notes (Signed)
Patient transported to CT 
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
## 2017-04-17 NOTE — ED Triage Notes (Signed)
Pt c/o non radiating, centralized chest pressure and shortness of breath that started yesterday when he woke up. Denies nausea/vomiting/diaphoresis. LS clear, SpO2 95% room air.

## 2017-04-17 NOTE — ED Notes (Signed)
This RN contacted lab, still working on BNP result

## 2017-04-18 MED ORDER — AZITHROMYCIN 250 MG PO TABS: 250.0000 mg | ORAL_TABLET | Freq: Every day | ORAL | 0 refills | Status: DC

## 2017-04-18 MED ORDER — ALBUTEROL SULFATE HFA 108 (90 BASE) MCG/ACT IN AERS
2.0000 | INHALATION_SPRAY | Freq: Once | RESPIRATORY_TRACT | Status: AC
  Administered 2017-04-18: 2 via RESPIRATORY_TRACT
  Filled 2017-04-18: qty 6.7

## 2017-04-18 MED ORDER — KETOROLAC TROMETHAMINE 30 MG/ML IJ SOLN
30.0000 mg | Freq: Once | INTRAMUSCULAR | Status: AC
Start: 2017-04-18 — End: 2017-04-18
  Administered 2017-04-18: 30 mg via INTRAVENOUS
  Filled 2017-04-18: qty 1

## 2017-04-18 MED ORDER — PREDNISONE 20 MG PO TABS: 40.0000 mg | ORAL_TABLET | Freq: Every day | ORAL | 0 refills | Status: DC

## 2017-04-18 NOTE — Discharge Instructions (Signed)
Your workup in the emergency department today was very reassuring and did not reveal a concerning cause of your shortness of breath.  We recommend that you try to improve your daily health habits.  This may be contributing to your worsening symptoms.  Take prednisone and azithromycin as prescribed until finished.  We advised that you discontinue smoking as this may worsen your shortness of breath.  You may continue the use of albuterol every 4-6 hours as needed for persistent shortness of breath.  Follow-up with a primary care doctor to ensure improvement in and resolution of symptoms.  You may return to the emergency department, as needed, for new or concerning symptoms.

## 2017-06-25 ENCOUNTER — Encounter (HOSPITAL_COMMUNITY): Payer: Self-pay

## 2017-06-25 ENCOUNTER — Other Ambulatory Visit: Payer: Self-pay

## 2017-06-25 ENCOUNTER — Emergency Department (HOSPITAL_COMMUNITY)
Admission: EM | Admit: 2017-06-25 | Discharge: 2017-06-25 | Disposition: A | Discharge status: Home / Self Care | Payer: Self-pay | Attending: Emergency Medicine | Admitting: Emergency Medicine

## 2017-06-25 ENCOUNTER — Emergency Department (HOSPITAL_COMMUNITY): Payer: Self-pay

## 2017-06-25 DIAGNOSIS — F17210 Nicotine dependence, cigarettes, uncomplicated: Secondary | ICD-10-CM | POA: Insufficient documentation

## 2017-06-25 DIAGNOSIS — M549 Dorsalgia, unspecified: Secondary | ICD-10-CM

## 2017-06-25 DIAGNOSIS — J45909 Unspecified asthma, uncomplicated: Secondary | ICD-10-CM | POA: Insufficient documentation

## 2017-06-25 DIAGNOSIS — M546 Pain in thoracic spine: Secondary | ICD-10-CM | POA: Insufficient documentation

## 2017-06-25 MED ORDER — METHOCARBAMOL 750 MG PO TABS: 750.0000 mg | ORAL_TABLET | Freq: Three times a day (TID) | ORAL | 0 refills | Status: DC | PRN

## 2017-06-25 NOTE — ED Notes (Signed)
Pt states that his back pain began in his middle back after playing basketball with friends on Saturday. Has a history of a herniated disk and pinched nerve in lower back, but states that this pain is different. No relief from ibuprofen and icy-hot.

## 2017-06-25 NOTE — ED Triage Notes (Signed)
Patient complains of thoracic back pain since Saturday after playing basketball with children. Pain with any ROM. No other associated symptoms

## 2017-06-25 NOTE — Discharge Instructions (Addendum)
Please take Ibuprofen (Advil, motrin) and Tylenol (acetaminophen) to relieve your pain.  You may take up to 600 MG (3 pills) of normal strength ibuprofen every 8 hours as needed.  In between doses of ibuprofen you make take tylenol, up to 1,000 mg (two extra strength pills).  Do not take more than 3,000 mg tylenol in a 24 hour period.  Please check all medication labels as many medications such as pain and cold medications may contain tylenol.  Do not drink alcohol while taking these medications.  Do not take other NSAID'S while taking ibuprofen (such as aleve or naproxen).  Please take ibuprofen with food to decrease stomach upset.  You are being prescribed a medication which may make you sleepy. For 24 hours after one dose please do not drive, operate heavy machinery, care for a small child with out another adult present, or perform any activities that may cause harm to you or someone else if you were to fall asleep or be impaired.
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
## 2017-09-29 ENCOUNTER — Encounter (HOSPITAL_COMMUNITY): Payer: Self-pay

## 2017-09-29 ENCOUNTER — Emergency Department (HOSPITAL_COMMUNITY): Payer: Self-pay

## 2017-09-29 ENCOUNTER — Emergency Department (HOSPITAL_COMMUNITY)
Admission: EM | Admit: 2017-09-29 | Discharge: 2017-09-29 | Disposition: A | Discharge status: Home / Self Care | Payer: Self-pay | Attending: Emergency Medicine | Admitting: Emergency Medicine

## 2017-09-29 ENCOUNTER — Other Ambulatory Visit: Payer: Self-pay

## 2017-09-29 DIAGNOSIS — J45901 Unspecified asthma with (acute) exacerbation: Secondary | ICD-10-CM | POA: Insufficient documentation

## 2017-09-29 DIAGNOSIS — R55 Syncope and collapse: Secondary | ICD-10-CM | POA: Insufficient documentation

## 2017-09-29 DIAGNOSIS — R1110 Vomiting, unspecified: Secondary | ICD-10-CM | POA: Insufficient documentation

## 2017-09-29 DIAGNOSIS — J40 Bronchitis, not specified as acute or chronic: Secondary | ICD-10-CM

## 2017-09-29 DIAGNOSIS — F17210 Nicotine dependence, cigarettes, uncomplicated: Secondary | ICD-10-CM | POA: Insufficient documentation

## 2017-09-29 DIAGNOSIS — E860 Dehydration: Secondary | ICD-10-CM | POA: Insufficient documentation

## 2017-09-29 LAB — URINALYSIS, ROUTINE W REFLEX MICROSCOPIC
BILIRUBIN URINE: NEGATIVE
Glucose, UA: NEGATIVE mg/dL
Hgb urine dipstick: NEGATIVE
KETONES UR: NEGATIVE mg/dL
Nitrite: NEGATIVE
Protein, ur: NEGATIVE mg/dL
Specific Gravity, Urine: 1.027 (ref 1.005–1.030)
pH: 5 (ref 5.0–8.0)

## 2017-09-29 LAB — BASIC METABOLIC PANEL
ANION GAP: 6 (ref 5–15)
BUN: 6 mg/dL (ref 6–20)
CHLORIDE: 102 mmol/L (ref 98–111)
CO2: 33 mmol/L — ABNORMAL HIGH (ref 22–32)
Calcium: 8.9 mg/dL (ref 8.9–10.3)
Creatinine, Ser: 0.88 mg/dL (ref 0.61–1.24)
GFR calc Af Amer: >60 mL/min (ref >60)
GFR calc non Af Amer: >60 mL/min (ref >60)
GLUCOSE: 158 mg/dL — AB (ref 70–99)
POTASSIUM: 3.7 mmol/L (ref 3.5–5.1)
Sodium: 141 mmol/L (ref 135–145)

## 2017-09-29 LAB — CBC
HEMATOCRIT: 48.1 % (ref 39.0–52.0)
HEMOGLOBIN: 14.4 g/dL (ref 13.0–17.0)
MCH: 31.1 pg (ref 26.0–34.0)
MCHC: 29.9 g/dL — AB (ref 30.0–36.0)
MCV: 103.9 fL — AB (ref 78.0–100.0)
Platelets: 263 10*3/uL (ref 150–400)
RBC: 4.63 MIL/uL (ref 4.22–5.81)
RDW: 13.4 % (ref 11.5–15.5)
WBC: 9.6 10*3/uL (ref 4.0–10.5)

## 2017-09-29 MED ORDER — SODIUM CHLORIDE 0.9 % IV BOLUS
1000.0000 mL | Freq: Once | INTRAVENOUS | Status: AC
  Administered 2017-09-29: 1000 mL via INTRAVENOUS

## 2017-09-29 MED ORDER — ALBUTEROL SULFATE HFA 108 (90 BASE) MCG/ACT IN AERS
2.0000 | INHALATION_SPRAY | RESPIRATORY_TRACT | Status: DC | PRN
  Administered 2017-09-29: 2 via RESPIRATORY_TRACT
  Filled 2017-09-29: qty 6.7

## 2017-09-29 MED ORDER — AEROCHAMBER PLUS FLO-VU LARGE MISC
Status: AC
  Filled 2017-09-29: qty 1

## 2017-09-29 MED ORDER — IOPAMIDOL (ISOVUE-370) INJECTION 76%
100.0000 mL | Freq: Once | INTRAVENOUS | Status: AC | PRN
  Administered 2017-09-29: 100 mL via INTRAVENOUS

## 2017-09-29 MED ORDER — AEROCHAMBER PLUS FLO-VU MEDIUM MISC
1.0000 | Freq: Once | Status: AC
  Administered 2017-09-29: 1
  Filled 2017-09-29: qty 1

## 2017-09-29 MED ORDER — PREDNISONE 50 MG PO TABS: 50.0000 mg | ORAL_TABLET | Freq: Every day | ORAL | 0 refills | Status: DC

## 2017-09-29 NOTE — ED Provider Notes (Signed)
Patient placed in Quick Look pathway, seen and evaluated   Chief Complaint: Syncope  HPI:   Patient states that at around 3:30 PM while he was in the backseat of a car he began to feel lightheaded and flushed.  Per the patient's mother he lost consciousness for a few seconds and then when he came to he was very nauseated and had 2 episodes of nonbloody nonbilious emesis.  No known seizure-like activity.  He notes ongoing fatigue, shortness of breath.  Denies chest pain.  He has a mild frontal headache but states it is not very severe compared to his migraines.  Denies vision changes, numbness or tingling.  Has been ambulatory since without difficulty.  ROS: Positive for syncope, fatigue, lightheadedness, shortness of breath, nausea, vomiting Negative for fevers, chest pain, numbness  Physical Exam:   Gen: No distress  Neuro: Awake and Alert  Skin: Warm    Focused Exam: Fluent speech with no evidence of dysarthria or aphasia, no facial droop, sensation intact to soft touch of extremities.  Cranial nerves II through XII tested and intact.  Ambulates with a generally steady gait, mild difficulty with Heel Walk and Toe Walk.  He is morbidly obese, resting comfortably in chair overall.  Lungs clear to auscultation bilaterally.  Regular rate and rhythm, no murmurs rubs or gallops noted.  2+ radial and DP/PT pulses bilaterally.   Initiation of care has begun. The patient has been counseled on the process, plan, and necessity for staying for the completion/evaluation, and the remainder of the medical screening examination    Debroah Baller 09/29/17 1754    Long, Wonda Olds, MD 09/30/17 1100

## 2017-09-29 NOTE — ED Notes (Signed)
Patient transported to X-ray 
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
## 2017-09-29 NOTE — ED Notes (Signed)
Attempted IV x2 without success

## 2017-09-29 NOTE — Discharge Instructions (Addendum)
Increase your fluid intake and rest as much as possible.  Return here as needed.  Follow-up with your primary doctor.

## 2017-09-29 NOTE — ED Triage Notes (Signed)
Pt endorses getting hot on his way to work and passing out and having an episode of vomiting. VSS. No neuro deficits.

## 2017-09-29 NOTE — ED Notes (Signed)
Pt unable to e-sign, pt in hallway.

## 2017-10-06 NOTE — ED Notes (Signed)
PT called from the lobby with no response x1

## 2017-10-07 ENCOUNTER — Emergency Department (HOSPITAL_COMMUNITY): Admission: EM | Admit: 2017-10-07 | Discharge: 2017-10-07 | Discharge status: Against Medical Advice | Payer: Self-pay

## 2018-09-30 ENCOUNTER — Other Ambulatory Visit: Payer: Self-pay

## 2018-09-30 ENCOUNTER — Emergency Department (HOSPITAL_COMMUNITY): Payer: Self-pay

## 2018-09-30 ENCOUNTER — Encounter (HOSPITAL_COMMUNITY): Payer: Self-pay

## 2018-09-30 ENCOUNTER — Emergency Department (HOSPITAL_COMMUNITY)
Admission: EM | Admit: 2018-09-30 | Discharge: 2018-09-30 | Disposition: A | Discharge status: Home / Self Care | Payer: Self-pay | Attending: Emergency Medicine | Admitting: Emergency Medicine

## 2018-09-30 DIAGNOSIS — R42 Dizziness and giddiness: Secondary | ICD-10-CM | POA: Insufficient documentation

## 2018-09-30 DIAGNOSIS — J45909 Unspecified asthma, uncomplicated: Secondary | ICD-10-CM | POA: Insufficient documentation

## 2018-09-30 DIAGNOSIS — R5383 Other fatigue: Secondary | ICD-10-CM | POA: Insufficient documentation

## 2018-09-30 DIAGNOSIS — R51 Headache: Secondary | ICD-10-CM | POA: Insufficient documentation

## 2018-09-30 DIAGNOSIS — R112 Nausea with vomiting, unspecified: Secondary | ICD-10-CM | POA: Insufficient documentation

## 2018-09-30 DIAGNOSIS — R55 Syncope and collapse: Secondary | ICD-10-CM | POA: Insufficient documentation

## 2018-09-30 DIAGNOSIS — F1210 Cannabis abuse, uncomplicated: Secondary | ICD-10-CM | POA: Insufficient documentation

## 2018-09-30 DIAGNOSIS — F17210 Nicotine dependence, cigarettes, uncomplicated: Secondary | ICD-10-CM | POA: Insufficient documentation

## 2018-09-30 LAB — CBC WITH DIFFERENTIAL/PLATELET
Abs Immature Granulocytes: 0.04 10*3/uL (ref 0.00–0.07)
Basophils Absolute: 0 10*3/uL (ref 0.0–0.1)
Basophils Relative: 0 %
Eosinophils Absolute: 0 10*3/uL (ref 0.0–0.5)
Eosinophils Relative: 0 %
HCT: 46.5 % (ref 39.0–52.0)
Hemoglobin: 14.2 g/dL (ref 13.0–17.0)
Immature Granulocytes: 0 %
Lymphocytes Relative: 10 %
Lymphs Abs: 1.2 10*3/uL (ref 0.7–4.0)
MCH: 32 pg (ref 26.0–34.0)
MCHC: 30.5 g/dL (ref 30.0–36.0)
MCV: 104.7 fL — ABNORMAL HIGH (ref 80.0–100.0)
Monocytes Absolute: 0.6 10*3/uL (ref 0.1–1.0)
Monocytes Relative: 5 %
Neutro Abs: 10.9 10*3/uL — ABNORMAL HIGH (ref 1.7–7.7)
Neutrophils Relative %: 85 %
Platelets: 235 10*3/uL (ref 150–400)
RBC: 4.44 MIL/uL (ref 4.22–5.81)
RDW: 13.9 % (ref 11.5–15.5)
WBC: 12.8 10*3/uL — ABNORMAL HIGH (ref 4.0–10.5)
nRBC: 0 % (ref 0.0–0.2)

## 2018-09-30 LAB — BASIC METABOLIC PANEL
Anion gap: 11 (ref 5–15)
BUN: 10 mg/dL (ref 6–20)
CO2: 29 mmol/L (ref 22–32)
Calcium: 8.8 mg/dL — ABNORMAL LOW (ref 8.9–10.3)
Chloride: 100 mmol/L (ref 98–111)
Creatinine, Ser: 0.87 mg/dL (ref 0.61–1.24)
GFR calc Af Amer: >60 mL/min (ref >60)
GFR calc non Af Amer: >60 mL/min (ref >60)
Glucose, Bld: 166 mg/dL — ABNORMAL HIGH (ref 70–99)
Potassium: 3.8 mmol/L (ref 3.5–5.1)
Sodium: 140 mmol/L (ref 135–145)
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
## 2018-09-30 NOTE — ED Notes (Signed)
MRI called _0 :56pm.

## 2018-09-30 NOTE — ED Notes (Signed)
Pt stated he is not strong enough at the time to complete ortostatic vital signs

## 2018-09-30 NOTE — ED Notes (Signed)
Bed: BT51 Expected date:  Expected time:  Means of arrival:  Comments: EMS/syncope

## 2018-09-30 NOTE — ED Triage Notes (Signed)
Pt BIB EMS from work. Pt reports falling and waking up on the fall. Pt reports dizziness. Pt is A&O x4. Pt started vomiting on arrival to ED. Denies hitting head.

## 2018-09-30 NOTE — Discharge Instructions (Signed)
You were seen in the emergency department for feeling like you get a pass out along with nausea and vomiting.  You had lab work and a CAT scan that did not show any serious findings.  Your symptoms were improved here and he felt well enough to go home.  Will be important that you follow-up with your doctor and return if any worsening symptoms.

## 2019-04-21 ENCOUNTER — Other Ambulatory Visit: Payer: Self-pay

## 2019-04-21 ENCOUNTER — Ambulatory Visit: Payer: Self-pay | Attending: Internal Medicine

## 2019-04-21 DIAGNOSIS — Z20822 Contact with and (suspected) exposure to covid-19: Secondary | ICD-10-CM

## 2019-04-22 LAB — NOVEL CORONAVIRUS, NAA: SARS-CoV-2, NAA: NOT DETECTED

## 2019-12-10 ENCOUNTER — Other Ambulatory Visit: Payer: Self-pay

## 2019-12-10 ENCOUNTER — Other Ambulatory Visit: Payer: Self-pay | Admitting: Sleep Medicine

## 2019-12-10 DIAGNOSIS — Z20822 Contact with and (suspected) exposure to covid-19: Secondary | ICD-10-CM

## 2019-12-13 LAB — NOVEL CORONAVIRUS, NAA: SARS-CoV-2, NAA: NOT DETECTED

## 2020-04-12 ENCOUNTER — Encounter: Payer: Self-pay | Admitting: Internal Medicine

## 2020-04-12 ENCOUNTER — Other Ambulatory Visit: Payer: Self-pay

## 2020-04-12 ENCOUNTER — Ambulatory Visit (INDEPENDENT_AMBULATORY_CARE_PROVIDER_SITE_OTHER): Payer: 59 | Admitting: Internal Medicine

## 2020-04-12 VITALS — BP 134/58 | HR 85 | Temp 98.0°F | Ht 77.0 in | Wt >= 6400 oz

## 2020-04-12 DIAGNOSIS — Z6841 Body Mass Index (BMI) 40.0 and over, adult: Secondary | ICD-10-CM

## 2020-04-12 DIAGNOSIS — R739 Hyperglycemia, unspecified: Secondary | ICD-10-CM | POA: Diagnosis not present

## 2020-04-12 DIAGNOSIS — G629 Polyneuropathy, unspecified: Secondary | ICD-10-CM | POA: Diagnosis not present

## 2020-04-12 DIAGNOSIS — I10 Essential (primary) hypertension: Secondary | ICD-10-CM | POA: Diagnosis not present

## 2020-04-12 DIAGNOSIS — G8929 Other chronic pain: Secondary | ICD-10-CM

## 2020-04-12 DIAGNOSIS — M545 Low back pain, unspecified: Secondary | ICD-10-CM

## 2020-04-12 DIAGNOSIS — R7303 Prediabetes: Secondary | ICD-10-CM

## 2020-04-12 LAB — POCT GLYCOSYLATED HEMOGLOBIN (HGB A1C): Hemoglobin A1C: 6.3 % — AB (ref 4.0–5.6)

## 2020-04-12 LAB — GLUCOSE, CAPILLARY: Glucose-Capillary: 113 mg/dL — ABNORMAL HIGH (ref 70–99)

## 2020-04-12 MED ORDER — DULOXETINE HCL 30 MG PO CPEP: 30.0000 mg | ORAL_CAPSULE | Freq: Every day | ORAL | 1 refills | Status: DC

## 2020-04-12 NOTE — Patient Instructions (Addendum)
Joe Carter,   It was a pleasure seeing you in clinic. Today we discussed:   Back pain and neuropathy:  As discussed, the back pain is likely secondary to excessive weight. I would encourage you to lose weight. I would caution you against excessive NSAID or Tylenol use as this can cause long term damage to your kidneys and liver. I am checking on lab work at this visit and will call you with any abnormal results.   If you have any questions or concerns, please call our clinic at [phone] between 9am-5pm and after hours call [phone] and ask for the internal medicine resident on call. If you feel you are having a medical emergency please call 911.   Thank you, we look forward to helping you remain healthy!  If you have not already done so, I recommend getting your COVID19 vaccine and booster shot.  To schedule an appointment for a COVID vaccine or be added to the vaccine wait list: Go to [email or web address]   OR Go to [email or web address]                  OR Call [phone]                                     OR Call [phone] and select Option 2
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
## 2020-04-13 ENCOUNTER — Encounter: Payer: Self-pay | Admitting: Internal Medicine

## 2020-04-13 DIAGNOSIS — R7303 Prediabetes: Secondary | ICD-10-CM | POA: Insufficient documentation

## 2020-04-13 LAB — CMP14 + ANION GAP
ALT: 15 IU/L (ref 0–44)
AST: 16 IU/L (ref 0–40)
Albumin/Globulin Ratio: 1.1 — ABNORMAL LOW (ref 1.2–2.2)
Albumin: 3.9 g/dL — ABNORMAL LOW (ref 4.0–5.0)
Alkaline Phosphatase: 87 IU/L (ref 44–121)
Anion Gap: 15 mmol/L (ref 10.0–18.0)
BUN/Creatinine Ratio: 15 (ref 9–20)
BUN: 12 mg/dL (ref 6–24)
Bilirubin Total: 0.3 mg/dL (ref 0.0–1.2)
CO2: 29 mmol/L (ref 20–29)
Calcium: 9.6 mg/dL (ref 8.7–10.2)
Chloride: 97 mmol/L (ref 96–106)
Creatinine, Ser: 0.78 mg/dL (ref 0.76–1.27)
GFR calc Af Amer: 130 mL/min/{1.73_m2} (ref >59)
GFR calc non Af Amer: 112 mL/min/{1.73_m2} (ref >59)
Globulin, Total: 3.5 g/dL (ref 1.5–4.5)
Glucose: 105 mg/dL — ABNORMAL HIGH (ref 65–99)
Potassium: 4.6 mmol/L (ref 3.5–5.2)
Sodium: 141 mmol/L (ref 134–144)
Total Protein: 7.4 g/dL (ref 6.0–8.5)

## 2020-04-13 LAB — LIPID PANEL
Chol/HDL Ratio: 5.3 ratio — ABNORMAL HIGH (ref 0.0–5.0)
Cholesterol, Total: 186 mg/dL (ref 100–199)
HDL: 35 mg/dL — ABNORMAL LOW (ref >39)
LDL Chol Calc (NIH): 129 mg/dL — ABNORMAL HIGH (ref 0–99)
Triglycerides: 118 mg/dL (ref 0–149)
VLDL Cholesterol Cal: 22 mg/dL (ref 5–40)

## 2020-04-13 LAB — TSH: TSH: 2.12 u[IU]/mL (ref 0.450–4.500)

## 2020-04-13 LAB — FOLATE RBC
Folate, Hemolysate: 317 ng/mL
Folate, RBC: 683 ng/mL (ref >498)
Hematocrit: 46.4 % (ref 37.5–51.0)

## 2020-04-13 LAB — VITAMIN B12: Vitamin B-12: 335 pg/mL (ref 232–1245)

## 2020-04-13 MED ORDER — ATORVASTATIN CALCIUM 40 MG PO TABS: 40.0000 mg | ORAL_TABLET | Freq: Every day | ORAL | 11 refills | Status: DC

## 2020-04-13 MED ORDER — LOSARTAN POTASSIUM-HCTZ 50-12.5 MG PO TABS: 1.0000 | ORAL_TABLET | Freq: Every day | ORAL | 0 refills | Status: DC

## 2020-04-13 NOTE — Assessment & Plan Note (Signed)
Current BMI >60. Patient endorses mostly sedentary lifestyle due to his chronic back pain. Advised patient for low-intensity exercise and dietary modifications.   Plan:  Consider referral to weight management program vs bariatric surgery
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
## 2020-04-13 NOTE — Assessment & Plan Note (Addendum)
Patient noted to have elevated BP to 150/92 on presentation, repeat 134/58. He denies any prior history of hypertension, although did have multiple documented readings of elevated BP. He denies any headache, vision changes, or focal deficits. Due to his back pain, patient has sedentary life style and suspect his weight could also be contributing. Patient advised for weight loss and encouraged for lifestyle modifications.  ASCVD risk 11%  Plan:  Start Losartan-HCTZ 50-12.99m daily  F/u in 4 weeks for BP and BMP check Start Lipitor 434mdaily

## 2020-04-13 NOTE — Assessment & Plan Note (Addendum)
Patient endorses chronic bilateral foot pain and burning sensation. He notes that this is worse with He was previously evaluated for this in 2018. He was noted to have a mildly elevated A1c; however, remainder of workup including vitamin B12, folate, TSH and RPR was negative. Patient was prescribed gabapentin for his symptoms; however, he notes that he never took this due to financial constraints.  Repeat testing with elevated A1c of 6.3; TSH, folate and B12 are wnl. Will start on duloxetine 39m daily.  Plan: Start duloxetine 322mdaily  F/u in 4 weeks

## 2020-04-13 NOTE — Assessment & Plan Note (Signed)
HbA1c 6.3, consistent with prediabetes.   Plan: Advised for dietary modification and exercise

## 2020-04-14 NOTE — Progress Notes (Signed)
Internal Medicine Clinic Attending  Case discussed with Dr. Marva Panda  At the time of the visit.  We reviewed the resident's history and exam and pertinent patient test results.  I agree with the assessment, diagnosis, and plan of care documented in the resident's note.

## 2020-04-18 ENCOUNTER — Telehealth: Payer: Self-pay | Admitting: *Deleted

## 2020-04-18 NOTE — Telephone Encounter (Signed)
Patient called in stating only losartan-HCTZ was at pharmacy. Call placed to [facility] [place]. States they were rejected on the day sent but are going through now. Patient notified.  Also, c/o dizziness yesterday with difficulty holding objects. First available appt given for tomorrow at 0915 with [facility] Team. Hubbard Hartshorn, BSN, RN-BC

## 2020-04-18 NOTE — Telephone Encounter (Signed)
Ok sounds good. I agree.

## 2020-04-19 ENCOUNTER — Encounter: Payer: 59 | Admitting: Internal Medicine

## 2020-04-19 ENCOUNTER — Telehealth: Payer: Self-pay | Admitting: *Deleted

## 2020-04-19 NOTE — Telephone Encounter (Signed)
Call to patient about missed appointment today.  Message left to call to reschedule if needed.  Sander Nephew, RN 04/19/2020 11:02 AM

## 2020-04-26 ENCOUNTER — Other Ambulatory Visit: Payer: Self-pay

## 2020-04-26 ENCOUNTER — Ambulatory Visit (INDEPENDENT_AMBULATORY_CARE_PROVIDER_SITE_OTHER): Payer: 59 | Admitting: Internal Medicine

## 2020-04-26 ENCOUNTER — Encounter: Payer: Self-pay | Admitting: Internal Medicine

## 2020-04-26 VITALS — BP 102/73 | HR 81 | Temp 98.7°F | Ht 77.0 in | Wt >= 6400 oz

## 2020-04-26 DIAGNOSIS — I10 Essential (primary) hypertension: Secondary | ICD-10-CM

## 2020-04-26 NOTE — Patient Instructions (Signed)
Thank you, Joe Carter for allowing [place] to provide your care today. Today we discussed hyptertension.    I have ordered the following labs for you:   Lab Orders     BMP8+Anion Gap   Tests ordered today:    Referrals ordered today:   Referral Orders  No referral(s) requested today     I have ordered the following medication/changed the following medications:   Stop the following medications: There are no discontinued medications.   Start the following medications: No orders of the defined types were placed in this encounter.    Follow up: 2-3 months    Remember: I will follow up with the results of your lab work.   Should you have any questions or concerns please call the internal medicine clinic at [phone].      Joe Carter, M.D. [place]

## 2020-04-27 LAB — BMP8+ANION GAP
Anion Gap: 14 mmol/L (ref 10.0–18.0)
BUN/Creatinine Ratio: 14 (ref 9–20)
BUN: 10 mg/dL (ref 6–24)
CO2: 31 mmol/L — ABNORMAL HIGH (ref 20–29)
Calcium: 9.5 mg/dL (ref 8.7–10.2)
Chloride: 97 mmol/L (ref 96–106)
Creatinine, Ser: 0.72 mg/dL — ABNORMAL LOW (ref 0.76–1.27)
GFR calc Af Amer: 134 mL/min/{1.73_m2} (ref >59)
GFR calc non Af Amer: 116 mL/min/{1.73_m2} (ref >59)
Glucose: 85 mg/dL (ref 65–99)
Potassium: 4.6 mmol/L (ref 3.5–5.2)
Sodium: 142 mmol/L (ref 134–144)

## 2020-04-27 NOTE — Assessment & Plan Note (Signed)
Orthostatic VS for the past 24 hrs (Last 3 readings):  BP- Lying Pulse- Lying BP- Sitting Pulse- Sitting BP- Standing at 0 minutes Pulse- Standing at 0 minutes  04/26/20 1604 115/84 77 121/74 86 131/78 96   BP Readings from Last 3 Encounters:  04/26/20 102/73  04/12/20 (!) 134/58  09/30/18 (!) 146/108   Patient here for follow-up of hypertension after being started on losartan-HCTZ 50-12.5 mg daily on 04/13/2020.  Blood pressure today 121/74 sitting.  He had 2 episodes of feeling lightheaded.  The first episode was 1 week after his appointment when he was sitting down.  The second episode was last week while standing.  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  Environment is very hot.  The episodes went away after 10 minutes and he did not lose consciousness.  Overall he is tolerating the blood pressure medications well and the history suggest these were vasovagal symptoms.  Continue treatment and encouraged initiating exercise routine and working up to 150 minutes of moderate to intense exercise per week.  -Continue with losartan-HCTZ 50-12.5 mg daily -Follow-up in 3 months

## 2020-04-27 NOTE — Progress Notes (Signed)
Internal Medicine Clinic Attending  Case discussed with Dr. Court Joy  At the time of the visit.  We reviewed the resident's history and exam and pertinent patient test results.  I agree with the assessment, diagnosis, and plan of care documented in the resident's note.
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
## 2020-05-10 ENCOUNTER — Other Ambulatory Visit: Payer: Self-pay | Admitting: Student

## 2020-05-10 ENCOUNTER — Other Ambulatory Visit: Payer: Self-pay | Admitting: Internal Medicine

## 2020-05-10 DIAGNOSIS — I10 Essential (primary) hypertension: Secondary | ICD-10-CM

## 2020-05-10 NOTE — Telephone Encounter (Signed)
Pharmacy comment: Product Backordered/Unavailable:LONG TERM BACKORDER PLEASE ADVISE.

## 2020-05-25 ENCOUNTER — Other Ambulatory Visit: Payer: Self-pay | Admitting: Student

## 2020-05-25 DIAGNOSIS — I10 Essential (primary) hypertension: Secondary | ICD-10-CM

## 2020-07-06 ENCOUNTER — Observation Stay (HOSPITAL_COMMUNITY): Payer: 59

## 2020-07-06 ENCOUNTER — Encounter (HOSPITAL_COMMUNITY): Payer: Self-pay | Admitting: Internal Medicine

## 2020-07-06 ENCOUNTER — Emergency Department (HOSPITAL_COMMUNITY): Payer: 59

## 2020-07-06 ENCOUNTER — Other Ambulatory Visit: Payer: Self-pay

## 2020-07-06 ENCOUNTER — Inpatient Hospital Stay (HOSPITAL_COMMUNITY)
Admission: EM | Admit: 2020-07-06 | Discharge: 2020-07-13 | DRG: 202 | Disposition: A | Discharge status: Home / Self Care | Payer: 59 | Attending: Internal Medicine | Admitting: Internal Medicine

## 2020-07-06 ENCOUNTER — Ambulatory Visit (INDEPENDENT_AMBULATORY_CARE_PROVIDER_SITE_OTHER): Payer: 59 | Admitting: Internal Medicine

## 2020-07-06 DIAGNOSIS — I110 Hypertensive heart disease with heart failure: Secondary | ICD-10-CM | POA: Diagnosis present

## 2020-07-06 DIAGNOSIS — F1290 Cannabis use, unspecified, uncomplicated: Secondary | ICD-10-CM | POA: Diagnosis present

## 2020-07-06 DIAGNOSIS — I5031 Acute diastolic (congestive) heart failure: Secondary | ICD-10-CM

## 2020-07-06 DIAGNOSIS — J9601 Acute respiratory failure with hypoxia: Secondary | ICD-10-CM

## 2020-07-06 DIAGNOSIS — E662 Morbid (severe) obesity with alveolar hypoventilation: Secondary | ICD-10-CM | POA: Diagnosis present

## 2020-07-06 DIAGNOSIS — J9602 Acute respiratory failure with hypercapnia: Secondary | ICD-10-CM | POA: Diagnosis present

## 2020-07-06 DIAGNOSIS — R519 Headache, unspecified: Secondary | ICD-10-CM | POA: Diagnosis present

## 2020-07-06 DIAGNOSIS — Z20822 Contact with and (suspected) exposure to covid-19: Secondary | ICD-10-CM | POA: Diagnosis present

## 2020-07-06 DIAGNOSIS — J440 Chronic obstructive pulmonary disease with acute lower respiratory infection: Secondary | ICD-10-CM | POA: Diagnosis present

## 2020-07-06 DIAGNOSIS — Z79899 Other long term (current) drug therapy: Secondary | ICD-10-CM

## 2020-07-06 DIAGNOSIS — R0602 Shortness of breath: Secondary | ICD-10-CM

## 2020-07-06 DIAGNOSIS — R7303 Prediabetes: Secondary | ICD-10-CM | POA: Diagnosis present

## 2020-07-06 DIAGNOSIS — J4541 Moderate persistent asthma with (acute) exacerbation: Secondary | ICD-10-CM

## 2020-07-06 DIAGNOSIS — J45901 Unspecified asthma with (acute) exacerbation: Principal | ICD-10-CM | POA: Diagnosis present

## 2020-07-06 DIAGNOSIS — Z9119 Patient's noncompliance with other medical treatment and regimen: Secondary | ICD-10-CM

## 2020-07-06 DIAGNOSIS — Z6841 Body Mass Index (BMI) 40.0 and over, adult: Secondary | ICD-10-CM

## 2020-07-06 DIAGNOSIS — Z833 Family history of diabetes mellitus: Secondary | ICD-10-CM

## 2020-07-06 DIAGNOSIS — E785 Hyperlipidemia, unspecified: Secondary | ICD-10-CM | POA: Diagnosis present

## 2020-07-06 DIAGNOSIS — F17210 Nicotine dependence, cigarettes, uncomplicated: Secondary | ICD-10-CM | POA: Diagnosis present

## 2020-07-06 DIAGNOSIS — I5030 Unspecified diastolic (congestive) heart failure: Secondary | ICD-10-CM

## 2020-07-06 DIAGNOSIS — J129 Viral pneumonia, unspecified: Secondary | ICD-10-CM | POA: Diagnosis present

## 2020-07-06 LAB — BASIC METABOLIC PANEL
Anion gap: 8 (ref 5–15)
BUN: 6 mg/dL (ref 6–20)
CO2: 32 mmol/L (ref 22–32)
Calcium: 8.9 mg/dL (ref 8.9–10.3)
Chloride: 99 mmol/L (ref 98–111)
Creatinine, Ser: 0.81 mg/dL (ref 0.61–1.24)
GFR, Estimated: >60 mL/min (ref >60)
Glucose, Bld: 101 mg/dL — ABNORMAL HIGH (ref 70–99)
Potassium: 4 mmol/L (ref 3.5–5.1)
Sodium: 139 mmol/L (ref 135–145)

## 2020-07-06 LAB — I-STAT VENOUS BLOOD GAS, ED
Acid-Base Excess: 8 mmol/L — ABNORMAL HIGH (ref 0.0–2.0)
Acid-Base Excess: 11 mmol/L — ABNORMAL HIGH (ref 0.0–2.0)
Bicarbonate: 40.4 mmol/L — ABNORMAL HIGH (ref 20.0–28.0)
Bicarbonate: 39.4 mmol/L — ABNORMAL HIGH (ref 20.0–28.0)
Calcium, Ion: 1.17 mmol/L (ref 1.15–1.40)
Calcium, Ion: 1.11 mmol/L — ABNORMAL LOW (ref 1.15–1.40)
HCT: 49 % (ref 39.0–52.0)
HCT: 46 % (ref 39.0–52.0)
Hemoglobin: 16.7 g/dL (ref 13.0–17.0)
Hemoglobin: 15.6 g/dL (ref 13.0–17.0)
O2 Saturation: 98 %
O2 Saturation: 91 %
Potassium: 4.2 mmol/L (ref 3.5–5.1)
Potassium: 4.8 mmol/L (ref 3.5–5.1)
Sodium: 142 mmol/L (ref 135–145)
Sodium: 141 mmol/L (ref 135–145)
TCO2: 43 mmol/L — ABNORMAL HIGH (ref 22–32)
TCO2: 41 mmol/L — ABNORMAL HIGH (ref 22–32)
pCO2, Ven: 91.2 mmHg (ref 44.0–60.0)
pCO2, Ven: 67.5 mmHg — ABNORMAL HIGH (ref 44.0–60.0)
pH, Ven: 7.254 (ref 7.250–7.430)
pH, Ven: 7.374 (ref 7.250–7.430)
pO2, Ven: 127 mmHg — ABNORMAL HIGH (ref 32.0–45.0)
pO2, Ven: 65 mmHg — ABNORMAL HIGH (ref 32.0–45.0)

## 2020-07-06 LAB — CBC
HCT: 46.9 % (ref 39.0–52.0)
Hemoglobin: 14.5 g/dL (ref 13.0–17.0)
MCH: 32.1 pg (ref 26.0–34.0)
MCHC: 30.9 g/dL (ref 30.0–36.0)
MCV: 103.8 fL — ABNORMAL HIGH (ref 80.0–100.0)
Platelets: 260 10*3/uL (ref 150–400)
RBC: 4.52 MIL/uL (ref 4.22–5.81)
RDW: 15.3 % (ref 11.5–15.5)
WBC: 9.4 10*3/uL (ref 4.0–10.5)
nRBC: 0 % (ref 0.0–0.2)

## 2020-07-06 LAB — TROPONIN I (HIGH SENSITIVITY)
Troponin I (High Sensitivity): 9 ng/L (ref <18)
Troponin I (High Sensitivity): 5 ng/L (ref <18)

## 2020-07-06 LAB — BLOOD GAS, VENOUS
Acid-Base Excess: 10.4 mmol/L — ABNORMAL HIGH (ref 0.0–2.0)
Bicarbonate: 37.4 mmol/L — ABNORMAL HIGH (ref 20.0–28.0)
FIO2: 52
O2 Saturation: 44.4 %
Patient temperature: 37
pCO2, Ven: 86.3 mmHg (ref 44.0–60.0)
pH, Ven: 7.26 (ref 7.250–7.430)
pO2, Ven: <31 mmHg — CL (ref 32.0–45.0)

## 2020-07-06 LAB — BRAIN NATRIURETIC PEPTIDE: B Natriuretic Peptide: 60.3 pg/mL (ref 0.0–100.0)

## 2020-07-06 LAB — RESP PANEL BY RT-PCR (FLU A&B, COVID) ARPGX2
Influenza A by PCR: NEGATIVE
Influenza B by PCR: NEGATIVE
SARS Coronavirus 2 by RT PCR: NEGATIVE

## 2020-07-06 LAB — D-DIMER, QUANTITATIVE: D-Dimer, Quant: 1.4 ug/mL-FEU — ABNORMAL HIGH (ref 0.00–0.50)

## 2020-07-06 MED ORDER — IPRATROPIUM-ALBUTEROL 0.5-2.5 (3) MG/3ML IN SOLN
3.0000 mL | RESPIRATORY_TRACT | Status: AC
  Administered 2020-07-06 – 2020-07-07 (×5): 3 mL via RESPIRATORY_TRACT
  Filled 2020-07-06 (×5): qty 3

## 2020-07-06 MED ORDER — POLYETHYLENE GLYCOL 3350 17 G PO PACK: 17.0000 g | PACK | Freq: Every day | ORAL | Status: DC | PRN

## 2020-07-06 MED ORDER — PREDNISONE 20 MG PO TABS
40.0000 mg | ORAL_TABLET | Freq: Every day | ORAL | Status: AC
  Administered 2020-07-06 – 2020-07-10 (×5): 40 mg via ORAL
  Filled 2020-07-06 (×5): qty 2

## 2020-07-06 MED ORDER — KETOROLAC TROMETHAMINE 30 MG/ML IJ SOLN
30.0000 mg | Freq: Once | INTRAMUSCULAR | Status: AC
  Administered 2020-07-06: 30 mg via INTRAVENOUS
  Filled 2020-07-06: qty 1

## 2020-07-06 MED ORDER — ADULT MULTIVITAMIN W/MINERALS CH
1.0000 | ORAL_TABLET | Freq: Every day | ORAL | Status: DC
  Administered 2020-07-07 – 2020-07-13 (×7): 1 via ORAL
  Filled 2020-07-06 (×7): qty 1

## 2020-07-06 MED ORDER — ACETAMINOPHEN 650 MG RE SUPP: 650.0000 mg | Freq: Four times a day (QID) | RECTAL | Status: DC | PRN

## 2020-07-06 MED ORDER — ENOXAPARIN SODIUM 120 MG/0.8ML ~~LOC~~ SOLN
120.0000 mg | SUBCUTANEOUS | Status: DC
  Administered 2020-07-06 – 2020-07-12 (×7): 120 mg via SUBCUTANEOUS
  Filled 2020-07-06 (×9): qty 0.8

## 2020-07-06 MED ORDER — ACETAMINOPHEN 325 MG PO TABS
650.0000 mg | ORAL_TABLET | Freq: Four times a day (QID) | ORAL | Status: DC | PRN
  Administered 2020-07-07 – 2020-07-09 (×3): 650 mg via ORAL
  Filled 2020-07-06 (×3): qty 2

## 2020-07-06 MED ORDER — IOHEXOL 350 MG/ML SOLN
80.0000 mL | Freq: Once | INTRAVENOUS | Status: AC | PRN
  Administered 2020-07-06: 200 mL via INTRAVENOUS

## 2020-07-06 NOTE — ED Notes (Signed)
RN attempted report x2

## 2020-07-06 NOTE — ED Triage Notes (Signed)
Pt arrived to triage c/o shortness of breath that started a couple of days ago Intermittent chest pain , denies any at this time   Pt place on O2 in triage for labored breathing

## 2020-07-06 NOTE — Assessment & Plan Note (Signed)
Patient presents with shortness of breath since Sunday.  Oxygen saturation 83%, started on 3 L supplemental oxygen he reports he woke up Sunday morning feeling short of breath.  On Monday he started having a dry cough, nasal congestion, watery and itchy eyes and right-sided chest pain.  He had 1 episode of vomiting.  Denies having any similar episodes to this in the past.  Denies any recent travel or sick contacts.  He works for shift in third shift.  Appeared very lethargic on exam but states this is normal for him after working third shift.  States that he is not vaccinated against COVID-19.  Denies any lost in taste or smell.  Exam is limited due to body habitus although mild expiratory wheezing was appreciated in all lung fields.  He is not in any respiratory distress or using accessory muscles.   Unclear etiology of hypoxemic respiratory failure.  Patient does have a history of asthma and only currently uses albuterol inhalers.  Never been diagnosed with COPD or emphysema.  Has an extensive tobacco use history, a pack a day for over 20 years.  Possible that this is a COPD flare versus COVID.  Although nebulizer treatment can be provided in the clinic we would need a negative COVID test first.  Discussed that his safest option at this point is to go to the ER for both the COVID test and nebulizer treatment.  We will closely follow-up.

## 2020-07-06 NOTE — Progress Notes (Signed)
Patient was transferred via wheelchair on 4 liters of Oxygen via Nasal Cannula to the ER.  Patient sleepy but responds to questions and is oriented.  Sander Nephew, RN 07/06/2020  9:35 AM.

## 2020-07-06 NOTE — ED Notes (Signed)
RN attempted report x3
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
## 2020-07-06 NOTE — ED Notes (Addendum)
Pt came from internal medicine appointment today and was sent here for [facility]. SHOB started on Sunday and pt tried to use inhalers at home with no relief. Pt sating upper 80s in triage and was put on 4L [place]. No pain at this time

## 2020-07-06 NOTE — ED Notes (Signed)
RN placed pt on bariatric bed

## 2020-07-06 NOTE — Progress Notes (Signed)
Date and time results received: 07/06/20 1851  Test: pCO2  Critical Value: 86.3  Test: pO2 Critical Value: 31.0  Name of Provider Notified: [place]

## 2020-07-06 NOTE — Progress Notes (Signed)
RN called d/t pt sat 86-88% on 6-7 lpm [place].  Upon entering room, sat 92-93% on 8 lpm [place].  Pt is  awake, no distress noted, smiling/laughing and looking a phone- appears comfortable.  Placed pt on 8 lpm [facility]NC, sat  now 92-94%.  No distress noted, RN aware.
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
## 2020-07-06 NOTE — Progress Notes (Signed)
07/06/2020 patient transfer from the emergency room to 2west. Patient skin assess feet dry and crack. He was placed on progressive monitor and central monitor was made aware. [facility] RN.

## 2020-07-06 NOTE — ED Notes (Signed)
RN attempted report x1.

## 2020-07-07 DIAGNOSIS — Z9119 Patient's noncompliance with other medical treatment and regimen: Secondary | ICD-10-CM | POA: Diagnosis not present

## 2020-07-07 DIAGNOSIS — J440 Chronic obstructive pulmonary disease with acute lower respiratory infection: Secondary | ICD-10-CM | POA: Diagnosis present

## 2020-07-07 DIAGNOSIS — R0609 Other forms of dyspnea: Secondary | ICD-10-CM | POA: Diagnosis not present

## 2020-07-07 DIAGNOSIS — I5031 Acute diastolic (congestive) heart failure: Secondary | ICD-10-CM | POA: Diagnosis present

## 2020-07-07 DIAGNOSIS — Z833 Family history of diabetes mellitus: Secondary | ICD-10-CM | POA: Diagnosis not present

## 2020-07-07 DIAGNOSIS — E785 Hyperlipidemia, unspecified: Secondary | ICD-10-CM | POA: Diagnosis present

## 2020-07-07 DIAGNOSIS — Z6841 Body Mass Index (BMI) 40.0 and over, adult: Secondary | ICD-10-CM | POA: Diagnosis not present

## 2020-07-07 DIAGNOSIS — E662 Morbid (severe) obesity with alveolar hypoventilation: Secondary | ICD-10-CM | POA: Diagnosis present

## 2020-07-07 DIAGNOSIS — Z20822 Contact with and (suspected) exposure to covid-19: Secondary | ICD-10-CM | POA: Diagnosis present

## 2020-07-07 DIAGNOSIS — J9602 Acute respiratory failure with hypercapnia: Secondary | ICD-10-CM | POA: Diagnosis present

## 2020-07-07 DIAGNOSIS — J9601 Acute respiratory failure with hypoxia: Secondary | ICD-10-CM | POA: Diagnosis present

## 2020-07-07 DIAGNOSIS — Z79899 Other long term (current) drug therapy: Secondary | ICD-10-CM | POA: Diagnosis not present

## 2020-07-07 DIAGNOSIS — J129 Viral pneumonia, unspecified: Secondary | ICD-10-CM | POA: Diagnosis present

## 2020-07-07 DIAGNOSIS — J45901 Unspecified asthma with (acute) exacerbation: Secondary | ICD-10-CM | POA: Diagnosis present

## 2020-07-07 DIAGNOSIS — F17210 Nicotine dependence, cigarettes, uncomplicated: Secondary | ICD-10-CM | POA: Diagnosis present

## 2020-07-07 DIAGNOSIS — R7303 Prediabetes: Secondary | ICD-10-CM | POA: Diagnosis present

## 2020-07-07 DIAGNOSIS — I110 Hypertensive heart disease with heart failure: Secondary | ICD-10-CM | POA: Diagnosis present

## 2020-07-07 DIAGNOSIS — F1290 Cannabis use, unspecified, uncomplicated: Secondary | ICD-10-CM | POA: Diagnosis present

## 2020-07-07 DIAGNOSIS — R519 Headache, unspecified: Secondary | ICD-10-CM | POA: Diagnosis present

## 2020-07-07 LAB — BLOOD GAS, VENOUS
Acid-Base Excess: 9.5 mmol/L — ABNORMAL HIGH (ref 0.0–2.0)
Bicarbonate: 36.4 mmol/L — ABNORMAL HIGH (ref 20.0–28.0)
FIO2: 100
O2 Saturation: 46.4 %
Patient temperature: 37
pCO2, Ven: 82.5 mmHg (ref 44.0–60.0)
pH, Ven: 7.267 (ref 7.250–7.430)
pO2, Ven: <31 mmHg — CL (ref 32.0–45.0)

## 2020-07-07 LAB — CBC
HCT: 48.4 % (ref 39.0–52.0)
Hemoglobin: 14.4 g/dL (ref 13.0–17.0)
MCH: 31.9 pg (ref 26.0–34.0)
MCHC: 29.8 g/dL — ABNORMAL LOW (ref 30.0–36.0)
MCV: 107.1 fL — ABNORMAL HIGH (ref 80.0–100.0)
Platelets: 242 10*3/uL (ref 150–400)
RBC: 4.52 MIL/uL (ref 4.22–5.81)
RDW: 15.1 % (ref 11.5–15.5)
WBC: 11.1 10*3/uL — ABNORMAL HIGH (ref 4.0–10.5)
nRBC: 0 % (ref 0.0–0.2)

## 2020-07-07 LAB — BASIC METABOLIC PANEL
Anion gap: 5 (ref 5–15)
BUN: 6 mg/dL (ref 6–20)
CO2: 33 mmol/L — ABNORMAL HIGH (ref 22–32)
Calcium: 8.8 mg/dL — ABNORMAL LOW (ref 8.9–10.3)
Chloride: 97 mmol/L — ABNORMAL LOW (ref 98–111)
Creatinine, Ser: 0.83 mg/dL (ref 0.61–1.24)
GFR, Estimated: >60 mL/min (ref >60)
Glucose, Bld: 170 mg/dL — ABNORMAL HIGH (ref 70–99)
Potassium: 4.3 mmol/L (ref 3.5–5.1)
Sodium: 135 mmol/L (ref 135–145)

## 2020-07-07 LAB — HIV ANTIBODY (ROUTINE TESTING W REFLEX): HIV Screen 4th Generation wRfx: NONREACTIVE

## 2020-07-07 LAB — PROCALCITONIN: Procalcitonin: <0.1 ng/mL

## 2020-07-07 MED ORDER — ALBUTEROL SULFATE (2.5 MG/3ML) 0.083% IN NEBU
INHALATION_SOLUTION | RESPIRATORY_TRACT | Status: AC
  Administered 2020-07-07: 2.5 mg
  Filled 2020-07-07: qty 3

## 2020-07-07 MED ORDER — ALBUTEROL SULFATE (2.5 MG/3ML) 0.083% IN NEBU
INHALATION_SOLUTION | RESPIRATORY_TRACT | Status: AC
  Administered 2020-07-07: 5 mg
  Filled 2020-07-07: qty 6

## 2020-07-07 MED ORDER — RAMELTEON 8 MG PO TABS
8.0000 mg | ORAL_TABLET | Freq: Every day | ORAL | Status: DC
  Administered 2020-07-07 – 2020-07-12 (×4): 8 mg via ORAL
  Filled 2020-07-07 (×9): qty 1

## 2020-07-07 MED ORDER — IPRATROPIUM-ALBUTEROL 0.5-2.5 (3) MG/3ML IN SOLN
3.0000 mL | RESPIRATORY_TRACT | Status: AC
  Administered 2020-07-07 – 2020-07-08 (×6): 3 mL via RESPIRATORY_TRACT
  Filled 2020-07-07 (×6): qty 3

## 2020-07-07 MED ORDER — ALBUTEROL SULFATE (2.5 MG/3ML) 0.083% IN NEBU
5.0000 mg | INHALATION_SOLUTION | Freq: Once | RESPIRATORY_TRACT | Status: AC
  Administered 2020-07-07: 5 mg via RESPIRATORY_TRACT
  Filled 2020-07-07: qty 6

## 2020-07-07 NOTE — Progress Notes (Signed)
Subjective:   Overnight, patient tolerated BiPAP poorly due to it causing chest discomfort, nursing staff states he essentially did not use it overnight, was on NRB and HFNC. Rapid response called this morning for pCO2 of 82 and reported confusion.  Upon exam, patient is no longer confused. He states this was because he just woke up. States his breathing does seem better than yesterday. Now coughing up clear mucus. Patient reports that he is hungry and wanting to eat despite our recommendation to continue with BiPAP. We discussed the possibility of patient requiring intubation if he does not continue to adhere to treatment. He states that he understands but he is hungry and upset.  We discussed repeatedly the need for BiPAP and for n.p.o.  He understands that if he eats, there is a risk that the BiPAP will cause him to aspirate.  He is currently refusing BiPAP because of headache and chest discomfort.  He states that he would want to be intubated if necessary to preserve his life.  He indicates that he will continue to refuse BiPAP.  Discussed that we needed to be n.p.o. to avoid complication, but he adamantly insists on eating.  He states that he understands he is excepting risk by doing this, and threatens to leave AMA if he is allowed to eat.  We will allow a meal as he is insistent, has capacity, and understands the risk if he requires escalating respiratory therapies.  Objective:  Vital signs in last 24 hours: Vitals:   07/07/20 0451 07/07/20 0524 07/07/20 0720 07/07/20 0808  BP:      Pulse:      Resp:      Temp:      TempSrc:      SpO2: (!) 87% 93% (!) 82% 92%  Weight:      Height:        Physical Exam Constitutional: Morbidly obese, no acute distress but has difficulty breathing, lays uncomfortably in bed due to his large habitus and height Head: atraumatic ENT: external ears normal Eyes: EOMI Cardiovascular: regular rate and rhythm, normal heart sounds Pulmonary: Increased  respiratory effort, lungs with mild wheezing in lower lung fields only which is improved compared to prior day Abdominal: flat Musculoskeletal: Tinea pedis and very dry skin over both feet, no ulcers Skin: warm and dry Neurological: alert, no focal deficit Psychiatric: Very frustrated  Assessment/Plan: Joe Carter is a 40 y.o. male with hx of morbid obesity, asthma, HTN, HLD, pre-diabetes presenting with shortness of breath, found to have hypoxic and hypercapnic respiratory failure.  Treated for asthma exacerbation.  Also requiring BiPAP, patient repeatedly refuses to keep this on.  Active Problems:   Acute respiratory failure with hypoxia and hypercapnia (HCC)   #Acute hypoxic and hypercapnic respiratory failure secondary to asthma exacerbation, atypical or viral pneumonia #Obesity hypoventilation syndrome #Likely undiagnosed sleep apnea Suspect asthma exacerbation triggered by atypical/viral infection, worsened by obesity hypoventilation syndrome likely undiagnosed sleep apnea.  Does report feeling better after starting prednisone, and wheezing is improved.  Had PCO2 of 91 on admission, improved to 67 after few hours of BiPAP.  At this point he refused BiPAP he does not want to eat.  Repeat later that night was 86.  Overnight he continued to take BiPAP off, complains of chest discomfort.  This morning had PCO2 of 82.  Again refusing BiPAP.  Insisted on eating, see documentation above.  He seems to understand the consequences and possible aspiration, continues to insist on eating.  He states that he would want intubation if necessary to save his life.  SPO2 is being adequately maintained with supplemental oxygen and breathing treatments. D-dimer 1.4.  CTA very limited and unable to properly assess for pulmonary embolism.  Due to his severe obesity, he does not have reliable physical exam to further stratify his risk.  At this time his symptoms can be explained by the other etiologies listed  above, and no particular risk factors for pulmonary medicine.  Hold off on VQ scan for now.  -Monitor respiratory status, low threshold to restart BiPAP or call PCCM for intubation -Pulmonal oxygen by nonrebreather and high flow nasal cannula -Moved from bed to bariatric chair for more comfort and better chest wall excursion -Advised repeatedly against eating, he repeatedly states that he understands the consequences and wants to eat. -Continue prednisone, day 2/5 -DuoNebs every 4 hours -Follow-up for respiratory panel -Follow-up procalcitonin -We will need outpatient sleep study  #Hypertension Home medication of olmesartan-HCTZ 20-12.5 mg. -Restart if blood pressure rises  Diet:  Ideally NPO, allowing carb modified as he is insistent and otherwise refuses therapies IVF:  none VTE:  lovenox Prior to Admission Living Arrangement:  home Anticipated Discharge Location:  home Barriers to Discharge:  Respiratory distress Dispo: Anticipated discharge in approximately 3-4 day(s).   Cato Mulligan, MD 07/07/2020, 8:22 AM Pager: [phone] After 5pm on weekdays and 1pm on weekends: On Call pager [phone]

## 2020-07-07 NOTE — Progress Notes (Signed)
Pt O2 desated in the high 69, Nurse called RT and Rapid and MD was notified. Recoved ranging in the high 80's to low 90's.

## 2020-07-07 NOTE — Progress Notes (Signed)
RT note. RT came to pt. Room to give duo tx. Pt. Stating he does not want to wear BIPAP at this time, pt. States that its hurting is face. Pt. Placed back on 10L [place], RN aware.

## 2020-07-07 NOTE — Significant Event (Signed)
Rapid Response Event Note   Reason for Call :  Oxygen desaturation, diminished breath sounds   Initial Focused Assessment:  Pt sitting on side of bed. He is alert, oriented. Skin is warm, dry, pink. Lung sounds are diminished throughout. Pt denies increased work of breathing. He is noted to be tachypneic with mild accessory muscle use.  Pt wore BiPAP some overnight. He endorses chest discomfort while wearing the BiPAP.   Respiratory therapist at bedside. Pt has received a Duoneb and Albuterol nebs.    VS: BP 151/99, HR 88, SpO2 90% on 15L HFNC & 100% NRB  Interventions:  -No intervention from RR RN- primary MD at bedside  Plan of Care:  -Oral care per protocol, suction at [facility] -Encourage BiPAP- pt requesting meal tray at this time. Meal ordered. Give pt time following oral intake before placing pt back on BiPAP. 30 minutes-1 hour.  -Bariatric chair to bedside  Event Summary:  MD Notified: IMTS Call Time: 0732 Arrival Time: 0745 End Time: [place], RN

## 2020-07-08 LAB — RESPIRATORY PANEL BY PCR

## 2020-07-08 LAB — BASIC METABOLIC PANEL
Anion gap: 8 (ref 5–15)
BUN: 8 mg/dL (ref 6–20)
CO2: 34 mmol/L — ABNORMAL HIGH (ref 22–32)
Calcium: 9.1 mg/dL (ref 8.9–10.3)
Chloride: 99 mmol/L (ref 98–111)
Creatinine, Ser: 0.9 mg/dL (ref 0.61–1.24)
GFR, Estimated: >60 mL/min (ref >60)
Glucose, Bld: 131 mg/dL — ABNORMAL HIGH (ref 70–99)
Potassium: 4.7 mmol/L (ref 3.5–5.1)
Sodium: 141 mmol/L (ref 135–145)

## 2020-07-08 LAB — CBC
HCT: 46.9 % (ref 39.0–52.0)
Hemoglobin: 14 g/dL (ref 13.0–17.0)
MCH: 31.7 pg (ref 26.0–34.0)
MCHC: 29.9 g/dL — ABNORMAL LOW (ref 30.0–36.0)
MCV: 106.3 fL — ABNORMAL HIGH (ref 80.0–100.0)
Platelets: 240 10*3/uL (ref 150–400)
RBC: 4.41 MIL/uL (ref 4.22–5.81)
RDW: 14.9 % (ref 11.5–15.5)
WBC: 13 10*3/uL — ABNORMAL HIGH (ref 4.0–10.5)
nRBC: 0 % (ref 0.0–0.2)

## 2020-07-08 MED ORDER — IPRATROPIUM-ALBUTEROL 0.5-2.5 (3) MG/3ML IN SOLN
3.0000 mL | RESPIRATORY_TRACT | Status: AC
  Administered 2020-07-08 – 2020-07-09 (×5): 3 mL via RESPIRATORY_TRACT
  Filled 2020-07-08 (×5): qty 3

## 2020-07-08 NOTE — Plan of Care (Signed)

## 2020-07-08 NOTE — Progress Notes (Signed)
Pt. Placed on BIPAP

## 2020-07-08 NOTE — Progress Notes (Signed)
When this nurse entered the patient's room, found patient sitting on the side of the bed and NT in the room with the patient. Patient stated that he wants to go to the bathroom. This nurse explained to the patient that he is on high-flow [place] and non-rebreather and his oxygen saturation is 76% at this time and this nurse would get BSC. Patient denied to use the [facility] and said," I am a 38 yr grown man and I am not about to shit on myself". MD Revonda Humphrey also at bedside at this time. MD explained the importance of being on oxygen but patient refused to listen and took his oxygen off and walked to the bathroom. This nurse stayed in the room while the patient was in bathroom. Offered patient oxygen through potable oxygen tank but patient refused. At about 1155 patient came out of the bathroom and patient was placed in the chair with HFNC and non rebreather. Respiratory therapy in the room at this time to give breathing treatment. Patient's oxygen sat upto 92% at this time.

## 2020-07-08 NOTE — Progress Notes (Signed)
Subjective:   Patient initially seen sitting on the edge of bedside.  He is upset that he needs to have a bowel movement and has not been able to get up to go.  He was not willing to wait for a bedside commode to be brought by.  I explained that he is not able to use the restroom in the room because his oxygen cannot reach that far.  Patient stated he did not care and he was " not about showed himself."  Patient took off his pulse oximetry and nasal cannula and walked to the restroom and close the door.  Patient's nurse and nurse tech were available in the room.  I returned after patient finished up in the restroom and he stated he did not have any dizziness or shortness of breath with standing or walking between the bathroom.  At this time, he has no acute complaints.  He notes that he did wear his BiPAP throughout the night.  Objective:  Vital signs in last 24 hours: Vitals:   07/08/20 0328 07/08/20 0450 07/08/20 0456 07/08/20 0526  BP:      Pulse:      Resp:      Temp:      TempSrc:      SpO2: 95% (!) 83% 90% 92%  Weight:      Height:        Physical Exam Constitutional: Morbidly obese, no acute distress Head: atraumatic ENT: external ears normal Eyes: EOMI Cardiovascular: regular rate and rhythm, normal heart sounds Pulmonary: Increased respiratory effort, decreased breath sounds throughout with poor air movement, expiratory wheezing heard bilaterally in the bases Musculoskeletal: Tinea pedis and very dry skin over both feet, no ulcers Skin: warm and dry Neurological: alert, no focal deficit Psychiatric: Frustrated with poor insight and judgment.  Assessment/Plan: Joe Carter is a 40 y.o. male with hx of morbid obesity, asthma, HTN, HLD, pre-diabetes presenting with shortness of breath, found to have hypoxic and hypercapnic respiratory failure.   Active Problems:   Acute respiratory failure with hypoxia and hypercapnia (HCC)  #Acute hypoxic and hypercapnic  respiratory failure #Asthma exacerbation #Obesity hypoventilation syndrome Suspect asthma exacerbation triggered by atypical/viral infection, worsened by obesity hypoventilation syndrome likely undiagnosed sleep apnea.  No indication of bacterial infection and pro-Cal is negative; hold off on any antibiotic at this time.  -Monitor respiratory status -Continue BiPAP at night -Continue prednisone, day 3/5 -DuoNebs every 4 hours -Follow-up for respiratory panel -We will need outpatient PFTs and sleep study  #Hypertension Home medication of olmesartan-HCTZ 20-12.5 mg. -Restart if blood pressure rises  Diet: Carb modified IVF:  none VTE:  lovenox  Prior to Admission Living Arrangement:  home Anticipated Discharge Location:  home Barriers to Discharge:  Respiratory distress Dispo: Anticipated discharge in approximately 3-4 day(s).   Dr. Jose Persia Internal Medicine PGY-2  Pager: [phone] After 5pm on weekdays and 1pm on weekends: On Call pager [phone]  07/08/2020, 6:55 AM

## 2020-07-08 NOTE — Progress Notes (Signed)
RT note. RT at bedside to give pt. Duo tx. RT found pt. On BIPAP at this time, VSS, RT will continue to monitor.

## 2020-07-09 ENCOUNTER — Inpatient Hospital Stay (HOSPITAL_COMMUNITY): Payer: 59

## 2020-07-09 LAB — BLOOD GAS, VENOUS
Acid-Base Excess: 12.8 mmol/L — ABNORMAL HIGH (ref 0.0–2.0)
Bicarbonate: 38.8 mmol/L — ABNORMAL HIGH (ref 20.0–28.0)
Drawn by: 164
O2 Saturation: 89.7 %
Patient temperature: 37
pCO2, Ven: 71.1 mmHg (ref 44.0–60.0)
pH, Ven: 7.356 (ref 7.250–7.430)
pO2, Ven: 61.6 mmHg — ABNORMAL HIGH (ref 32.0–45.0)

## 2020-07-09 MED ORDER — IPRATROPIUM-ALBUTEROL 0.5-2.5 (3) MG/3ML IN SOLN
RESPIRATORY_TRACT | Status: AC
  Administered 2020-07-09: 3 mL
  Filled 2020-07-09: qty 3

## 2020-07-09 MED ORDER — IPRATROPIUM-ALBUTEROL 0.5-2.5 (3) MG/3ML IN SOLN
3.0000 mL | Freq: Four times a day (QID) | RESPIRATORY_TRACT | Status: DC
  Administered 2020-07-09 – 2020-07-11 (×7): 3 mL via RESPIRATORY_TRACT
  Filled 2020-07-09 (×7): qty 3

## 2020-07-09 MED ORDER — FUROSEMIDE 10 MG/ML IJ SOLN
20.0000 mg | Freq: Once | INTRAMUSCULAR | Status: AC
  Administered 2020-07-09: 20 mg via INTRAVENOUS
  Filled 2020-07-09: qty 2

## 2020-07-09 NOTE — Progress Notes (Addendum)
   Subjective:   Patient is sleeping on beginning of interview today.  He awakens gradually, but is not grossly encephalopathic.  He states that he is feels well and his breathing has improved since admission.  He is asking go home.  Objective:  Vital signs in last 24 hours: Vitals:   07/08/20 2357 07/09/20 0124 07/09/20 0413 07/09/20 0434  BP: (!) 121/50  126/70   Pulse: 91  87   Resp: 17  17   Temp: 98.6 F (37 C)  98.3 F (36.8 C)   TempSrc: Axillary  Axillary   SpO2: 94% 92% 96% 92%  Weight:      Height:        Physical Exam Constitutional: Morbidly obese, no acute distress Head: atraumatic ENT: external ears normal Eyes: EOMI Pulm: normal work of breathing, mild expiratory wheezing over L lower lung field Musculoskeletal: 1+ pitting edema bilaterally.  Tinea pedis and very dry skin over both feet, no ulcers Skin: warm and dry Neurological: alert, no focal deficit Psychiatric: Affect improved today with less frustration  Assessment/Plan: Joe Carter is a 40 y.o. male with hx of morbid obesity, asthma, HTN, HLD, pre-diabetes presenting with shortness of breath, found to have hypoxic and hypercapnic respiratory failure.   Active Problems:   Acute respiratory failure with hypoxia and hypercapnia (HCC)  #Acute hypoxic and hypercapnic respiratory failure #Asthma exacerbation #Obesity hypoventilation syndrome Suspect asthma exacerbation triggered by atypical/viral infection, worsened by obesity hypoventilation syndrome likely undiagnosed sleep apnea.  No indication of bacterial infection and pro-Cal is negative; hold off on any antibiotic at this time.  Viral respiratory panel was negative but there was lab error initially so was not collected until day 3. Requiring supplemental oxygen, weaned to 10L today and saturating adequately in the low 90s. Diuresed 3L after one-time dose of Lasix 39m IV yesterday. CXR today with still significant bilateral fluffy opacities.  Worsening Bicarb likely due to hypercapnea and contraction alkosis.  -Monitor respiratory status -Supplemental oxygen to maintain oxygen saturation above 88%, wean as tolerated -Continue BiPAP at night -Continue prednisone, day 4/5 -s/p Lasix IV 234myesterday with good diuresis -f/u echo and BNP -DuoNebs every 6 hours -We will need outpatient PFTs and sleep study  #Hypertension Home medication of olmesartan-HCTZ 20-12.5 mg. BP fairly labile with normal readings at times. -Restart if blood pressure rises  Diet: Carb modified IVF:  none VTE:  lovenox  Prior to Admission Living Arrangement:  home Anticipated Discharge Location:  home Barriers to Discharge:  Respiratory distress Dispo: Anticipated discharge in approximately 2-3 day(s).    Joe Carter Acute on chronic hypoxic and hypercapnic respiratory failure, Obesity hyperventilation syndrome. Pt requires frequent durations of respiratory support and deteriorates quickly in the absence of non-invasive mechanical ventilator. BIPAP has been considered but ruled out and insufficient. Pt's PCO2 was 82.5 07/07/20. Interruption or failure to provide NIV would quickly lead to exacerbation of the patients condition, lead to hospitalization and likely harm the patient or possibly death. Continued use of the NIV is preferred. Patient is able to maintain airway and clear secretions.     Dr. JoEdison Simonnternal Medicine PGY-1 Pager: [phone] 5pm on weekdays and 1pm on weekends: On Call pager [phone]/17/2022, 7:19 AM

## 2020-07-10 ENCOUNTER — Inpatient Hospital Stay (HOSPITAL_COMMUNITY): Payer: 59

## 2020-07-10 DIAGNOSIS — J9601 Acute respiratory failure with hypoxia: Secondary | ICD-10-CM

## 2020-07-10 DIAGNOSIS — J9602 Acute respiratory failure with hypercapnia: Secondary | ICD-10-CM

## 2020-07-10 DIAGNOSIS — I5031 Acute diastolic (congestive) heart failure: Secondary | ICD-10-CM

## 2020-07-10 DIAGNOSIS — R0609 Other forms of dyspnea: Secondary | ICD-10-CM

## 2020-07-10 DIAGNOSIS — J45901 Unspecified asthma with (acute) exacerbation: Principal | ICD-10-CM

## 2020-07-10 DIAGNOSIS — E662 Morbid (severe) obesity with alveolar hypoventilation: Secondary | ICD-10-CM

## 2020-07-10 DIAGNOSIS — I110 Hypertensive heart disease with heart failure: Secondary | ICD-10-CM

## 2020-07-10 LAB — BLOOD GAS, ARTERIAL
Acid-Base Excess: 14.2 mmol/L — ABNORMAL HIGH (ref 0.0–2.0)
Acid-Base Excess: 13 mmol/L — ABNORMAL HIGH (ref 0.0–2.0)
Bicarbonate: 40.2 mmol/L — ABNORMAL HIGH (ref 20.0–28.0)
Bicarbonate: 38.3 mmol/L — ABNORMAL HIGH (ref 20.0–28.0)
Drawn by: 36527
Drawn by: 36527
FIO2: 48
FIO2: 60
O2 Saturation: 68.7 %
O2 Saturation: 92.9 %
Patient temperature: 36.9
Patient temperature: 37
pCO2 arterial: 72.7 mmHg (ref 32.0–48.0)
pCO2 arterial: 62.1 mmHg — ABNORMAL HIGH (ref 32.0–48.0)
pH, Arterial: 7.361 (ref 7.350–7.450)
pH, Arterial: 7.407 (ref 7.350–7.450)
pO2, Arterial: 39.4 mmHg — CL (ref 83.0–108.0)
pO2, Arterial: 67.3 mmHg — ABNORMAL LOW (ref 83.0–108.0)

## 2020-07-10 LAB — CBC
HCT: 44.3 % (ref 39.0–52.0)
Hemoglobin: 13.5 g/dL (ref 13.0–17.0)
MCH: 31.8 pg (ref 26.0–34.0)
MCHC: 30.5 g/dL (ref 30.0–36.0)
MCV: 104.2 fL — ABNORMAL HIGH (ref 80.0–100.0)
Platelets: 214 10*3/uL (ref 150–400)
RBC: 4.25 MIL/uL (ref 4.22–5.81)
RDW: 14.7 % (ref 11.5–15.5)
WBC: 11.8 10*3/uL — ABNORMAL HIGH (ref 4.0–10.5)
nRBC: 0 % (ref 0.0–0.2)

## 2020-07-10 LAB — ECHOCARDIOGRAM COMPLETE
Area-P 1/2: 3.08 cm2
Height: 77 in
S' Lateral: 3 cm
Weight: 8561.61 oz

## 2020-07-10 LAB — BASIC METABOLIC PANEL
Anion gap: 3 — ABNORMAL LOW (ref 5–15)
BUN: 11 mg/dL (ref 6–20)
CO2: 42 mmol/L — ABNORMAL HIGH (ref 22–32)
Calcium: 8.7 mg/dL — ABNORMAL LOW (ref 8.9–10.3)
Chloride: 94 mmol/L — ABNORMAL LOW (ref 98–111)
Creatinine, Ser: 0.84 mg/dL (ref 0.61–1.24)
GFR, Estimated: >60 mL/min (ref >60)
Glucose, Bld: 167 mg/dL — ABNORMAL HIGH (ref 70–99)
Potassium: 3.9 mmol/L (ref 3.5–5.1)
Sodium: 139 mmol/L (ref 135–145)

## 2020-07-10 LAB — BRAIN NATRIURETIC PEPTIDE: B Natriuretic Peptide: 59 pg/mL (ref 0.0–100.0)

## 2020-07-10 MED ORDER — DULOXETINE HCL 30 MG PO CPEP
30.0000 mg | ORAL_CAPSULE | Freq: Every day | ORAL | Status: DC
  Administered 2020-07-10 – 2020-07-13 (×4): 30 mg via ORAL
  Filled 2020-07-10 (×4): qty 1

## 2020-07-10 MED ORDER — MOMETASONE FURO-FORMOTEROL FUM 100-5 MCG/ACT IN AERO
2.0000 | INHALATION_SPRAY | Freq: Two times a day (BID) | RESPIRATORY_TRACT | Status: DC
  Filled 2020-07-10: qty 8.8

## 2020-07-10 MED ORDER — MOMETASONE FURO-FORMOTEROL FUM 100-5 MCG/ACT IN AERO
2.0000 | INHALATION_SPRAY | Freq: Two times a day (BID) | RESPIRATORY_TRACT | Status: DC
  Administered 2020-07-10 – 2020-07-13 (×6): 2 via RESPIRATORY_TRACT
  Filled 2020-07-10: qty 8.8

## 2020-07-10 MED ORDER — ATORVASTATIN CALCIUM 40 MG PO TABS
40.0000 mg | ORAL_TABLET | Freq: Every day | ORAL | Status: DC
  Administered 2020-07-10 – 2020-07-13 (×4): 40 mg via ORAL
  Filled 2020-07-10 (×4): qty 1

## 2020-07-10 MED ORDER — FUROSEMIDE 10 MG/ML IJ SOLN
40.0000 mg | Freq: Once | INTRAMUSCULAR | Status: AC
  Administered 2020-07-10: 40 mg via INTRAVENOUS
  Filled 2020-07-10: qty 4

## 2020-07-10 MED ORDER — PERFLUTREN LIPID MICROSPHERE
1.0000 mL | INTRAVENOUS | Status: AC | PRN
  Administered 2020-07-10: 2 mL via INTRAVENOUS
  Filled 2020-07-10: qty 10

## 2020-07-10 NOTE — Progress Notes (Signed)
Placed patient on BIPAP per med request. Will obtain ABG after 1 hour on BIPAP.

## 2020-07-10 NOTE — Progress Notes (Signed)
Per MD to have patient where BIPAP as tolerated this evening and then to wear tonight. Currently coming off BIPAP after 2 hours on, to eat. Placed back on NRB by RN.

## 2020-07-10 NOTE — Progress Notes (Signed)
Critical lab results reported, PCO2-72.7 and PO2-39.4.  Notified Dr. Bridgett Larsson, will continue to monitor.

## 2020-07-10 NOTE — Progress Notes (Addendum)
Event note  Notified of critical value, ABG with pO2 39.4 and pCO2 72.7.  I evaluated the patient who seems to be similar to this morning, work of breathing has not worsened.  Pulse ox of 89-90%.  Spoke with respiratory therapy, who thought this could possibly be a venous sample but cannot rule out that it was arterial.  Note that patient has a very high BMI and it is difficult to obtain labs.  Asked RT to start BiPAP and recheck ABG in 1 hour.  We will follow-up.

## 2020-07-10 NOTE — Progress Notes (Signed)
ABG sent to lab. Lab was called by RT and notified.

## 2020-07-10 NOTE — Progress Notes (Signed)
Internal Medicine Clinic Attending  I saw and evaluated the patient.  I personally confirmed the key portions of the history and exam documented by Dr.  Laural Golden  and I reviewed pertinent patient test results.  The assessment, diagnosis, and plan were formulated together and I agree with the documentation in the resident's note.

## 2020-07-10 NOTE — Progress Notes (Signed)
ABG collected and sent to lab. Lab called.

## 2020-07-10 NOTE — TOC Initial Note (Signed)
Transition of Care [facility]) - Initial/Assessment Note    Patient Details  Name: Joe Carter MRN: [id number] Date of Birth: 02/05/1981  [facility]) CM/SW Contact:    Angelita Ingles, RN Phone Number: [phone]  07/10/2020, 3:43 PM  Clinical Narrative:                 [facility] consulted for trilogy. CM at bedside to offer patient choice for DME. NO current DME. Patient lfrom home where he is independent. Trilogy approval has been initiated by [facility]. Patient reports that he will return home where he will be able to take care of himself. CM will continue to follow.   Expected Discharge Plan: Home/Self Care Barriers to Discharge: Continued Medical Work up   Patient Goals and CMS Choice Patient states their goals for this hospitalization and ongoing recovery are:: Ready to go home today CMS Medicare.gov Compare Post Acute Care list provided to:: Patient Choice offered to / list presented to : Patient  Expected Discharge Plan and Services Expected Discharge Plan: Home/Self Care In-house Referral: NA Discharge Planning Services: CM Consult Post Acute Care Choice: Durable Medical Equipment Living arrangements for the past 2 months: Single Family Home                 DME Arranged: NIV DME Agency: Other - Comment ([facility]) Date DME Agency Contacted: 07/10/20 Time DME Agency Contacted: [phone] Representative spoke with at DME Agency: Melene Muller HH Arranged: NA [place] Agency: NA        Prior Living Arrangements/Services Living arrangements for the past 2 months: [place] Lives with:: Parents Patient language and need for interpreter reviewed:: Yes Do you feel safe going back to the place where you live?: Yes      Need for Family Participation in Patient Care: Yes (Comment) Care giver support system in place?: Yes (comment)   Criminal Activity/Legal Involvement Pertinent to Current Situation/Hospitalization: No - Comment as  needed  Activities of Daily Living Home Assistive Devices/Equipment: None ADL Screening (condition at time of admission) Patient's cognitive ability adequate to safely complete daily activities?: Yes Is the patient deaf or have difficulty hearing?: No Does the patient have difficulty seeing, even when wearing glasses/contacts?: No Does the patient have difficulty concentrating, remembering, or making decisions?: No Patient able to express need for assistance with ADLs?: Yes Does the patient have difficulty dressing or bathing?: No Independently performs ADLs?: Yes (appropriate for developmental age) Does the patient have difficulty walking or climbing stairs?: No Weakness of Legs: None Weakness of Arms/Hands: None  Permission Sought/Granted   Permission granted to share information with : No              Emotional Assessment Appearance:: Appears older than stated age Attitude/Demeanor/Rapport: Gracious Affect (typically observed): Pleasant Orientation: : Oriented to Self,Oriented to Place,Oriented to  Time,Oriented to Situation Alcohol / Substance Use: Not Applicable Psych Involvement: No (comment)  Admission diagnosis:  Acute respiratory failure with hypoxia and hypercarbia (HCC) [J96.01, J96.02] Acute respiratory failure with hypoxia and hypercapnia ([place]) [J96.01, J96.02] Patient Active Problem List   Diagnosis Date Noted  . Acute hypoxemic respiratory failure ([place]) 07/06/2020  . Acute respiratory failure with hypoxia and hypercapnia ([place]) 07/06/2020  . Prediabetes 04/13/2020  . Morbid obesity with BMI of 60.0-69.9, adult ([place]) 04/13/2020  . Neuropathy 08/01/2016  . Back pain 08/01/2016  . Essential hypertension 08/01/2016   PCP:  Mike Craze, DO Pharmacy:   [id number]  [place], [place] [address] Phone: [phone] Fax: [phone]     Social Determinants of Health (SDOH) Interventions     Readmission Risk Interventions No flowsheet data found.

## 2020-07-10 NOTE — Progress Notes (Signed)
  Echocardiogram 2D Echocardiogram has been performed.  Jennette Dubin 07/10/2020, 2:40 PM

## 2020-07-11 DIAGNOSIS — I5031 Acute diastolic (congestive) heart failure: Secondary | ICD-10-CM

## 2020-07-11 DIAGNOSIS — I5030 Unspecified diastolic (congestive) heart failure: Secondary | ICD-10-CM

## 2020-07-11 LAB — BASIC METABOLIC PANEL
Anion gap: 6 (ref 5–15)
BUN: 15 mg/dL (ref 6–20)
CO2: 40 mmol/L — ABNORMAL HIGH (ref 22–32)
Calcium: 8.7 mg/dL — ABNORMAL LOW (ref 8.9–10.3)
Chloride: 93 mmol/L — ABNORMAL LOW (ref 98–111)
Creatinine, Ser: 0.87 mg/dL (ref 0.61–1.24)
GFR, Estimated: >60 mL/min (ref >60)
Glucose, Bld: 113 mg/dL — ABNORMAL HIGH (ref 70–99)
Potassium: 3.8 mmol/L (ref 3.5–5.1)
Sodium: 139 mmol/L (ref 135–145)

## 2020-07-11 LAB — CBC
HCT: 43.8 % (ref 39.0–52.0)
Hemoglobin: 13.6 g/dL (ref 13.0–17.0)
MCH: 31.5 pg (ref 26.0–34.0)
MCHC: 31.1 g/dL (ref 30.0–36.0)
MCV: 101.4 fL — ABNORMAL HIGH (ref 80.0–100.0)
Platelets: 214 10*3/uL (ref 150–400)
RBC: 4.32 MIL/uL (ref 4.22–5.81)
RDW: 14.5 % (ref 11.5–15.5)
WBC: 13.1 10*3/uL — ABNORMAL HIGH (ref 4.0–10.5)
nRBC: 0 % (ref 0.0–0.2)

## 2020-07-11 MED ORDER — IPRATROPIUM-ALBUTEROL 0.5-2.5 (3) MG/3ML IN SOLN
3.0000 mL | Freq: Two times a day (BID) | RESPIRATORY_TRACT | Status: DC
  Administered 2020-07-11 – 2020-07-13 (×4): 3 mL via RESPIRATORY_TRACT
  Filled 2020-07-11 (×4): qty 3

## 2020-07-11 MED ORDER — FUROSEMIDE 10 MG/ML IJ SOLN
60.0000 mg | Freq: Every day | INTRAMUSCULAR | Status: DC
  Administered 2020-07-11 – 2020-07-12 (×2): 60 mg via INTRAVENOUS
  Filled 2020-07-11 (×2): qty 6

## 2020-07-11 NOTE — Progress Notes (Addendum)
Subjective: Joe Carter is feeling better since yesterday. Breathing is doing better. He had a lot of urine output after lasix yesterday. He is laying flat comfortably. He is anxious to get home as soon as possible.  We explained the concept of diastolic heart failure and how it contributed to his breathing issues.    Objective:  Vital signs in last 24 hours: Vitals:   07/11/20 0021 07/11/20 0022 07/11/20 0041 07/11/20 0437  BP:  122/72  118/70  Pulse: 88 99  98  Resp:  _0 Temp:  97.9 F (36.6 C)  97.8 F (36.6 C)  TempSrc:  Axillary  Axillary  SpO2: 98% 97%  98%  Weight:      Height:        Physical Exam Constitutional: Morbidly obese, no acute distress Head: atraumatic ENT: external ears normal Eyes: EOMI Pulm: normal work of breathing, mild expiratory wheezing over L lower lung field Musculoskeletal: 1+ pitting edema bilaterally.  Tinea pedis and very dry skin over both feet, no ulcers Skin: warm and dry Neurological: alert, no focal deficit Psychiatric: Affect improved today with less frustration  Assessment/Plan: Joe Carter is a 40 y.o. male with hx of morbid obesity, asthma, HTN, HLD, pre-diabetes presenting with shortness of breath, found to have hypoxic and hypercapnic respiratory failure.   Active Problems:   Acute respiratory failure with hypoxia and hypercapnia (HCC)  #Acute hypoxic and hypercapnic respiratory failure #Acute HFpEF #Asthma exacerbation #Obesity hypoventilation syndrome  Suspect multifactorial due to possible asthma exacerbation triggered by atypical/viral infection, worsened by obesity hypoventilation syndrome likely undiagnosed sleep apnea. Echo this admission limited by habitus but had normal EF with dilated IVC.  BNP low but can be falsely low in obesity.  Clinically he is responding well to IV diuresis, suggesting likely a component of acute diastolic heart failure as well.   -Monitor respiratory status - Lasix IV 60 mg  today -Supplemental oxygen to maintain oxygen saturation above 88%, wean as tolerated -Continue BiPAP at night -Completed 5 days prednisone -dulera -DuoNebs every 6 hours -We will need outpatient PFTs and sleep study   #Hypertension Home medication of olmesartan-HCTZ 20-12.5 mg. BP fairly labile with normal readings at times. -Restart if blood pressure rises  Diet: Carb modified IVF:  none VTE:  lovenox  Prior to Admission Living Arrangement:  home Anticipated Discharge Location:  home Barriers to Discharge:  Respiratory distress Dispo: Anticipated discharge in approximately 1-2 day(s).    [name] has Acute on chronic hypoxic and hypercapnic respiratory failure, Obesity hyperventilation syndrome. Pt requires frequent durations of respiratory support and deteriorates quickly in the absence of non-invasive mechanical ventilator. BIPAP has been considered but ruled out and insufficient. Pt's PCO2 was 82.5 07/07/20. Interruption or failure to provide NIV would quickly lead to exacerbation of the patients condition, lead to hospitalization and likely harm the patient or possibly death. Continued use of the NIV is preferred. Patient is able to maintain airway and clear secretions.    Dr. Edison Simon Internal Medicine PGY-1 Pager: [phone] After 5pm on weekdays and 1pm on weekends: On Call pager [phone]  07/11/2020, 7:00 AM

## 2020-07-12 LAB — BASIC METABOLIC PANEL
Anion gap: 8 (ref 5–15)
BUN: 14 mg/dL (ref 6–20)
CO2: 38 mmol/L — ABNORMAL HIGH (ref 22–32)
Calcium: 8.7 mg/dL — ABNORMAL LOW (ref 8.9–10.3)
Chloride: 94 mmol/L — ABNORMAL LOW (ref 98–111)
Creatinine, Ser: 0.78 mg/dL (ref 0.61–1.24)
GFR, Estimated: >60 mL/min (ref >60)
Glucose, Bld: 104 mg/dL — ABNORMAL HIGH (ref 70–99)
Potassium: 3.7 mmol/L (ref 3.5–5.1)
Sodium: 140 mmol/L (ref 135–145)

## 2020-07-12 LAB — CBC
HCT: 46.9 % (ref 39.0–52.0)
Hemoglobin: 14.5 g/dL (ref 13.0–17.0)
MCH: 31.4 pg (ref 26.0–34.0)
MCHC: 30.9 g/dL (ref 30.0–36.0)
MCV: 101.5 fL — ABNORMAL HIGH (ref 80.0–100.0)
Platelets: 211 10*3/uL (ref 150–400)
RBC: 4.62 MIL/uL (ref 4.22–5.81)
RDW: 14.6 % (ref 11.5–15.5)
WBC: 10.2 10*3/uL (ref 4.0–10.5)
nRBC: 0 % (ref 0.0–0.2)

## 2020-07-12 MED ORDER — EMPAGLIFLOZIN 10 MG PO TABS
10.0000 mg | ORAL_TABLET | Freq: Every day | ORAL | Status: DC
  Filled 2020-07-12: qty 1

## 2020-07-12 MED ORDER — FUROSEMIDE 10 MG/ML IJ SOLN
60.0000 mg | Freq: Two times a day (BID) | INTRAMUSCULAR | Status: DC
  Administered 2020-07-12 – 2020-07-13 (×2): 60 mg via INTRAVENOUS
  Filled 2020-07-12 (×2): qty 6

## 2020-07-12 NOTE — Plan of Care (Signed)

## 2020-07-12 NOTE — Progress Notes (Signed)
   Subjective:   Patient states he feels better from yesterday.  He is sitting up on the side of the bed and having conversation on the phone speaking full sentences.  Reports good urine output with the Lasix.  Again requesting to go home soon as possible.  Objective:  Vital signs in last 24 hours: Vitals:   07/12/20 0347 07/12/20 0500 07/12/20 0739 07/12/20 0747  BP: (!) 139/56   (!) 112/54  Pulse: 82   84  Resp: 20   20  Temp: 97.7 F (36.5 C)   98.7 F (37.1 C)  TempSrc: Axillary   Oral  SpO2: 97%  (!) 89% 90%  Weight:  (!) 218.9 kg    Height:        Physical Exam Constitutional: Morbidly obese, no acute distress Head: atraumatic ENT: external ears normal Eyes: EOMI Pulm: normal work of breathing, mild expiratory wheezing over L lower lung field Musculoskeletal: Trace nonpitting edema bilaterally.  Tinea pedis and very dry skin over both feet, no ulcers Skin: warm and dry Neurological: alert, no focal deficit Psychiatric: Affect improved today with less frustration  Assessment/Plan: Joe Carter is a 40 y.o. male with hx of morbid obesity, asthma, HTN, HLD, pre-diabetes presenting with shortness of breath, found to have hypoxic and hypercapnic respiratory failure, at this time treating for new onset HFpEF.   Active Problems:   Acute respiratory failure with hypoxia and hypercapnia (HCC)   Acute diastolic heart failure (HCC)  #Acute hypoxic and hypercapnic respiratory failure #New onset HFpEF #Asthma exacerbation #Obesity hypoventilation syndrome At this time we are treating him for new onset HFpEF.  Initially had moderate wheezing on exam which has resolved by treating his asthma exacerbation, but noted to be volume up on his echo.  Clinically responding well to diuresis.   -Monitor respiratory status - Increase Lasix IV 60 mg to twice daily -Supplemental oxygen to maintain oxygen saturation above 88%, wean as tolerated -Continue BiPAP at night -Completed 5 days  prednisone -dulera -DuoNebs every 6 hours -We will need outpatient PFTs and sleep study   #Hypertension Home medication of olmesartan-HCTZ 20-12.5 mg. BP fairly labile with normal readings at times. -Restart if blood pressure rises  Diet: Carb modified IVF:  none VTE:  lovenox  Prior to Admission Living Arrangement:  home Anticipated Discharge Location:  home Barriers to Discharge:  Respiratory failure Dispo: Anticipated discharge in approximately 1-2 day(s).    Joe Carter has Acute on chronic hypoxic and hypercapnic respiratory failure, Obesity hyperventilation syndrome. Pt requires frequent durations of respiratory support and deteriorates quickly in the absence of non-invasive mechanical ventilator. BIPAP has been considered but ruled out and insufficient. Pt's PCO2 was 82.5 07/07/20. Interruption or failure to provide NIV would quickly lead to exacerbation of the patients condition, lead to hospitalization and likely harm the patient or possibly death. Continued use of the NIV is preferred. Patient is able to maintain airway and clear secretions.    Dr. Edison Simon Internal Medicine PGY-1 Pager: [phone] After 5pm on weekdays and 1pm on weekends: On Call pager [phone]  07/12/2020, 11:30 AM

## 2020-07-13 ENCOUNTER — Other Ambulatory Visit (HOSPITAL_COMMUNITY): Payer: Self-pay

## 2020-07-13 LAB — BASIC METABOLIC PANEL
Anion gap: 7 (ref 5–15)
BUN: 15 mg/dL (ref 6–20)
CO2: 39 mmol/L — ABNORMAL HIGH (ref 22–32)
Calcium: 8.7 mg/dL — ABNORMAL LOW (ref 8.9–10.3)
Chloride: 92 mmol/L — ABNORMAL LOW (ref 98–111)
Creatinine, Ser: 0.83 mg/dL (ref 0.61–1.24)
GFR, Estimated: >60 mL/min (ref >60)
Glucose, Bld: 123 mg/dL — ABNORMAL HIGH (ref 70–99)
Potassium: 3.6 mmol/L (ref 3.5–5.1)
Sodium: 138 mmol/L (ref 135–145)

## 2020-07-13 LAB — MAGNESIUM: Magnesium: 2.1 mg/dL (ref 1.7–2.4)

## 2020-07-13 MED ORDER — FUROSEMIDE 80 MG PO TABS
80.0000 mg | ORAL_TABLET | Freq: Every day | ORAL | 0 refills | Status: DC
  Filled 2020-07-13: qty 30, 30d supply, fill #0

## 2020-07-13 MED ORDER — EMPAGLIFLOZIN 10 MG PO TABS
10.0000 mg | ORAL_TABLET | Freq: Every day | ORAL | Status: DC
  Filled 2020-07-13: qty 1

## 2020-07-13 MED ORDER — ADVAIR HFA 115-21 MCG/ACT IN AERO: 2.0000 | INHALATION_SPRAY | Freq: Two times a day (BID) | RESPIRATORY_TRACT | 0 refills | Status: DC

## 2020-07-13 MED ORDER — DAPAGLIFLOZIN PROPANEDIOL 10 MG PO TABS
10.0000 mg | ORAL_TABLET | Freq: Every day | ORAL | 0 refills | Status: DC
  Filled 2020-07-13: qty 30, 30d supply, fill #0

## 2020-07-13 MED ORDER — MOMETASONE FURO-FORMOTEROL FUM 100-5 MCG/ACT IN AERO
2.0000 | INHALATION_SPRAY | Freq: Two times a day (BID) | RESPIRATORY_TRACT | 0 refills | Status: DC
  Filled 2020-07-13: qty 1, fill #0

## 2020-07-13 MED ORDER — DAPAGLIFLOZIN PROPANEDIOL 10 MG PO TABS
10.0000 mg | ORAL_TABLET | Freq: Every day | ORAL | Status: DC
  Filled 2020-07-13: qty 1

## 2020-07-13 MED ORDER — POTASSIUM CHLORIDE CRYS ER 20 MEQ PO TBCR
40.0000 meq | EXTENDED_RELEASE_TABLET | Freq: Two times a day (BID) | ORAL | Status: DC
  Administered 2020-07-13: 40 meq via ORAL
  Filled 2020-07-13: qty 2

## 2020-07-13 NOTE — Progress Notes (Signed)
SATURATION QUALIFICATIONS: (This note is used to comply with regulatory documentation for home oxygen)  Patient Saturations on Room Air at Rest = 82%  Patient Saturations on Room Air while Ambulating = 80%  Patient Saturations on 3 Liters of oxygen while Ambulating = 94%  Please briefly explain why patient needs home oxygen: oxygen required to maintain saturation over 88% spo2

## 2020-07-13 NOTE — Progress Notes (Signed)
Patient states will place self on NIV once ready for bed.

## 2020-07-13 NOTE — Discharge Summary (Signed)
Name: Joe Carter MRN: [id number] DOB: Aug 04, 1980 40 y.o. PCP: Mike Craze, DO  Date of Admission: 07/06/2020  9:51 AM Date of Discharge:  07/13/2020 Attending Physician: Velna Ochs, MD \  Discharge Diagnosis: 1. New onset heart failure with preserved ejection fraction 2. Asthma exacerbation 3. Suspect obstructive sleep apnea 4. obesity hypoventilation syndrome  Discharge Medications: Allergies as of 07/13/2020   No Known Allergies     Medication List    STOP taking these medications   olmesartan-hydrochlorothiazide 20-12.5 MG tablet Commonly known as: BENICAR HCT     TAKE these medications   acetaminophen 500 MG tablet Commonly known as: TYLENOL Take 500 mg by mouth every 6 (six) hours as needed for headache.   Advair HFA 115-21 MCG/ACT inhaler Generic drug: fluticasone-salmeterol Inhale 2 puffs into the lungs 2 (two) times daily. Notes to patient: **NEW** To improve breathing. Use every day even if you feel well to prevent breathing issues   atorvastatin 40 MG tablet Commonly known as: Lipitor Take 1 tablet (40 mg total) by mouth daily. Notes to patient: To lower cholesterol and decrease artery inflammation. Watch for unexplained muscle aches/pains and talk to a doctor soon if this occurs   DULoxetine 30 MG capsule Commonly known as: Cymbalta Take 1 capsule (30 mg total) by mouth daily. Notes to patient: To improve mood   Farxiga 10 MG Tabs tablet Generic drug: dapagliflozin propanediol Take 1 tablet (10 mg total) by mouth daily. Notes to patient: **NEW** Decreases fluids and blood sugars   furosemide 80 MG tablet Commonly known as: Lasix Take 1 tablet (80 mg total) by mouth daily. Notes to patient: **NEW** Water pill to remove fluid            Durable Medical Equipment  (From admission, onward)         Start     Ordered   07/13/20 1431  For home use only DME oxygen  Once       Comments: Anticipate discharge today  Question Answer  Comment  Length of Need 6 Months   Mode or (Route) Nasal cannula   Liters per Minute 3   Frequency Continuous (stationary and portable oxygen unit needed)   Oxygen conserving device Yes   Oxygen delivery system Gas      07/13/20 1430          Disposition and follow-up:   Mr.Robb A Giammarco was discharged from [facility] in Stable condition.  At the hospital follow up visit please address:  1.  Follow up: . Respiratory failure-discharged with 3 L home oxygen, ambulate in clinic and assess for continued requirement . New onset HFpEF- started on Farxiga and Lasix 80 mg, check BMP on follow-up early next week, evaluate for UTI symptoms with [place]. . Hypertension- Home medication of olmesartan-HCTZ but had labile blood pressures here so was discontinued at time of discharge.  Consider changing to spironolactone for comorbid HFpEF . Asthma exacerbation- initially had wheezing which resolved with treatment, started on maintenance inhaler but cost may be an issue, see below . Suspected obstructive sleep apnea and obesity hypoventilation syndrome- discharged with trilogy machine, consider outpatient sleep study . Difficulty affording medications- has [employer] insurance, discharged with new Farxiga with 30-day supply for free but will need assistance in the future, also went home with his Dulera inhaler from the hospital but will need assistance with long-term maintenance inhaler.  Advair has $35 co-pay.  Referral made to clinical pharmacist   2.  Labs / imaging needed at time of follow-up: BMP, ambulatory pulse ox, sleep study  3.  Pending labs/ test needing follow-up: None  Follow-up Appointments: Message sent to front desk to schedule for appointment at internal medicine center ideally on Monday.  Hospital Course by problem list:   #Acute hypoxic and hypercapnic respiratory failure #New onset HFpEF #Asthma exacerbation #Obesity hypoventilation syndrome Patient  presenting with 1 week of cough and increasing shortness of breath.  Found to have SPO2 of 82% in clinic, sent to the ED for further management.  Patient was noted to be increasingly encephalopathic, had PCO2 of 92 on VBG.  Started on BiPAP with improvement in mental status, PCO2 improved to 67.  Patient had difficulty with tolerating BiPAP throughout this admission, was later changed to nighttime only but did not wear it consistently.  Noted to have diffuse wheezing on exam so was treated as asthma exacerbation initially with prednisone, duo nebs, and Dulera.  Wheezing resolved the patient had improvement in symptoms, but continued to require oxygen.  Had difficulty obtaining respiratory panel, but ultimately did not show any infectious organisms.  We obtained an echo which demonstrated normal ejection fraction but was very limited by habitus, IVC was dilated and right atrial pressure was elevated, he was diagnosed with new onset heart failure with preserved ejection fraction.  Symptoms continue to improve with Lasix, responded well to diuresis.  On day of discharge was weaned down to 3 L and maintaining saturations well.  Presented him with the option to stay for further diuresis or to go home with oxygen, he opted to go home and follow-up in clinic.  Started on Farxiga for GDMT for HFpEF, held off on spironolactone for now due to occasional soft blood pressures.  Pertinent Labs, Studies, and Procedures:  DG Chest 2 View  Result Date: 07/06/2020 CLINICAL DATA:  Shortness of breath. EXAM: CHEST - 2 VIEW COMPARISON:  September 29 2017 FINDINGS: Cardiomediastinal silhouette is normal. Mediastinal contours appear intact. There is no evidence of pleural effusion or pneumothorax. Subtle ground-glass opacities with lower lobe predominance are seen bilaterally. Osseous structures are without acute abnormality. Soft tissues are grossly normal. IMPRESSION: Subtle ground-glass opacities with lower lobe predominance  bilaterally may represent early atypical/viral pneumonia. Electronically Signed   By: Fidela Salisbury M.D.   On: 07/06/2020 10:52   CT ANGIO CHEST PE W OR WO CONTRAST  Result Date: 07/07/2020 CLINICAL DATA:  Shortness of breath. EXAM: CT ANGIOGRAPHY CHEST WITH CONTRAST TECHNIQUE: Multidetector CT imaging of the chest was performed using the standard protocol during bolus administration of intravenous contrast. Multiplanar CT image reconstructions and MIPs were obtained to evaluate the vascular anatomy. CONTRAST:  215m OMNIPAQUE IOHEXOL 350 MG/ML SOLN COMPARISON:  September 29, 2017 FINDINGS: Cardiovascular: The thoracic aorta is normal in appearance. Pulmonary arteries are markedly limited in evaluation secondary to suboptimal opacification with intravenous contrast. Normal heart size. No pericardial effusion. Mediastinum/Nodes: There is mild left hilar lymphadenopathy. Subcentimeter pretracheal and AP window lymph nodes are also noted. Thyroid gland, trachea, and esophagus demonstrate no significant findings. Lungs/Pleura: Moderate severity areas of scarring, atelectasis and/or early infiltrate are seen within the bilateral upper lobes, right middle lobe and bilateral lower lobes. There is no evidence of a pleural effusion or pneumothorax. Upper Abdomen: No acute abnormality. Musculoskeletal: No chest wall abnormality. No acute or significant osseous findings. Review of the MIP images confirms the above findings. IMPRESSION: 1. Bilateral, moderate severity areas of scarring, atelectasis and/or early infiltrate. 2. Markedly limited evaluation  of the pulmonary arteries, as described above. As a result, pulmonary embolism cannot be excluded. Further evaluation with a nuclear medicine ventilation/perfusion scan is recommended if this remains of clinical concern. Electronically Signed   By: Virgina Norfolk M.D.   On: 07/07/2020 00:21    BMP Latest Ref Rng & Units 07/13/2020 07/12/2020 07/11/2020  Glucose 70 - 99  mg/dL 123(H) 104(H) 113(H)  BUN 6 - 20 mg/dL _0 Creatinine 0.61 - 1.24 mg/dL 0.83 0.78 0.87  BUN/Creat Ratio 9 - 20 - - -  Sodium 135 - 145 mmol/L 138 140 139  Potassium 3.5 - 5.1 mmol/L 3.6 3.7 3.8  Chloride 98 - 111 mmol/L 92(L) 94(L) 93(L)  CO2 22 - 32 mmol/L 39(H) 38(H) 40(H)  Calcium 8.9 - 10.3 mg/dL 8.7(L) 8.7(L) 8.7(L)     Discharge Instructions: Discharge Instructions    Amb Referral to Clinical Pharmacist   Complete by: As directed    Discharging from hospital, needs assistance with Farxiga and Advair or other maintenance inhaler. Hoping to get him appointment with clinic on Monday 4/24   Consult Type: Transition of care   Urgency: 1-2 Weeks   Diet - low sodium heart healthy   Complete by: As directed    Discharge instructions   Complete by: As directed    Mr. Lantry, it was a pleasure taking care of you. You were treated for new onset heart failure, asthma attack, and sleep apnea. Here are your discharge instructions:  1. STOP Benicar. Your blood pressure did not requiring lowering this admission. 2. START Lasix, 1 tablet daily. This is a fluid pill 3. START Farxiga, 1 tablet daily. This will help your heart function. 4. START oxygen by nasal canula, 3 liters 5. START Trilogy machine at night.  Follow up with [place] in clinic, they will call to make an appointment. At that time, we will check if you continue needing the oxygen.   Increase activity slowly   Complete by: As directed       Signed: Andrew Au, MD 07/13/2020, 4:27 PM   Pager: [phone]

## 2020-07-13 NOTE — TOC Benefit Eligibility Note (Signed)
Patient Teacher, English as a foreign language completed.    The patient is currently admitted and upon discharge could be taking Jardiance 10 mg.  The current 30 day co-pay is, $200.00.   The patient is currently admitted and upon discharge could be taking Farxiga 10 mg.  The current 30 day co-pay is, $75.00.   The patient is insured through [place], [place] Patient Advocate Specialist [place] Team Direct Number: [phone]  Fax: [phone]

## 2020-07-13 NOTE — Progress Notes (Signed)
Subjective:   Patient with no acute complaints today.  States that he feels well and his breathing has been improving throughout the course of this admission.  Again requesting to go home.  Does not have pain anywhere.  States that he has been ambulating to the bathroom by himself without oxygen.  Denies shortness of breath or dizziness while doing this.  Objective:  Vital signs in last 24 hours: Vitals:   07/12/20 2006 07/12/20 2040 07/12/20 2214 07/13/20 0629  BP: 109/74  117/62 112/85  Pulse: 100  100 75  Resp: _0 Temp: 98.2 F (36.8 C)  98.3 F (36.8 C) 98 F (36.7 C)  TempSrc: Oral  Oral Oral  SpO2: 94% 95% 96% 96%  Weight:      Height:        Physical Exam Constitutional: Morbidly obese, no acute distress Head: atraumatic ENT: external ears normal Eyes: EOMI Pulm: normal work of breathing, lungs clear bilaterally though exam is limited by habitus Musculoskeletal: Trace nonpitting edema bilaterally.  Tinea pedis and very dry skin over both feet, no ulcers Skin: warm and dry Neurological: alert, no focal deficit Psychiatric: Affect improved today with less frustration  Assessment/Plan: Joe Carter is a 40 y.o. male with hx of morbid obesity, asthma, HTN, HLD, pre-diabetes presenting with shortness of breath, found to have hypoxic and hypercapnic respiratory failure, at this time treating for new onset HFpEF.   Active Problems:   Acute respiratory failure with hypoxia and hypercapnia (HCC)   Acute diastolic heart failure (HCC)  #Acute hypoxic and hypercapnic respiratory failure #New onset HFpEF #Asthma exacerbation #Obesity hypoventilation syndrome At this time we are treating him for new onset HFpEF.  Initially had moderate wheezing on exam which has resolved by treating his asthma exacerbation, but noted to be volume up on his echo.  Clinically responding well to diuresis.  Decrease supplemental oxygen to 3 L and patient maintain saturation above  90%.  - Ambulatory pulse ox trial today -Monitor respiratory status - Continue Lasix IV 60 mg twice daily -Supplemental oxygen to maintain oxygen saturation above 88%, wean as tolerated -Continue BiPAP at night -Completed 5 days prednisone -dulera -DuoNebs every 6 hours -We will need outpatient PFTs and sleep study - Start Farxiga 10 mg   #Hypertension Home medication of olmesartan-HCTZ 20-12.5 mg. BP fairly labile with normal readings at times. -Restart if blood pressure rises -Spironolactone is an option given his new HFpEF diagnosis and lasix requirement  Diet: Carb modified IVF:  none VTE:  lovenox  Prior to Admission Living Arrangement:  home Anticipated Discharge Location:  home Barriers to Discharge:  Respiratory failure, oxygen requirement Dispo: Anticipated discharge in approximately 0-1 day(s).    Joe Carter has Acute on chronic hypoxic and hypercapnic respiratory failure, Obesity hyperventilation syndrome. Pt requires frequent durations of respiratory support and deteriorates quickly in the absence of non-invasive mechanical ventilator. BIPAP has been considered but ruled out and insufficient. Pt's PCO2 was 82.5 07/07/20. Interruption or failure to provide NIV would quickly lead to exacerbation of the patients condition, lead to hospitalization and likely harm the patient or possibly death. Continued use of the NIV is preferred. Patient is able to maintain airway and clear secretions.    Dr. Edison Simon Internal Medicine PGY-1 Pager: [phone] After 5pm on weekdays and 1pm on weekends: On Call pager [phone]  07/13/2020, 6:54 AM

## 2020-07-14 ENCOUNTER — Telehealth: Payer: Self-pay | Admitting: Internal Medicine

## 2020-07-14 ENCOUNTER — Other Ambulatory Visit (HOSPITAL_COMMUNITY): Payer: Self-pay

## 2020-07-14 ENCOUNTER — Other Ambulatory Visit: Payer: Self-pay | Admitting: Student

## 2020-07-14 MED ORDER — ADVAIR HFA 115-21 MCG/ACT IN AERO: 2.0000 | INHALATION_SPRAY | Freq: Two times a day (BID) | RESPIRATORY_TRACT | 5 refills | Status: DC

## 2020-07-14 MED ORDER — DAPAGLIFLOZIN PROPANEDIOL 10 MG PO TABS: 10.0000 mg | ORAL_TABLET | Freq: Every day | ORAL | 5 refills | Status: DC

## 2020-07-14 NOTE — Telephone Encounter (Signed)
TOC HFU appointment 07/17/2020 at 2:15 pm with Dr. Laural Golden.  Patient made aware of date and time via my chart.

## 2020-07-14 NOTE — TOC Progression Note (Signed)
Transition of Care [facility]) - Progression Note    Patient Details  Name: Joe Carter MRN: [id number] Date of Birth: 05-25-1980  Transition of Care [facility]) CM/SW Elizabeth, RN Phone Number: [phone]  07/14/2020, 10:03 AM  Clinical Narrative:    Home O2/ Trilogy has been set up with [facility]. DME to be delivered.Patient to be discharged home self care. CM has updated patient. No other needs noted at this time. TOC will sign off   Expected Discharge Plan: Home/Self Care Barriers to Discharge: Continued Medical Work up  Expected Discharge Plan and Services Expected Discharge Plan: Home/Self Care In-house Referral: NA Discharge Planning Services: CM Consult Post Acute Care Choice: Durable Medical Equipment Living arrangements for the past 2 months: Single Family Home Expected Discharge Date: 07/13/20               DME Arranged: NIV DME Agency: Other - Comment ([place]) Date DME Agency Contacted: 07/10/20 Time DME Agency Contacted: [id number] Representative spoke with at DME Agency: Melene Muller HH Arranged: NA HH Agency: NA         Social Determinants of Health ([place]) Interventions    Readmission Risk Interventions No flowsheet data found.

## 2020-07-14 NOTE — Telephone Encounter (Signed)
-----  Message from Andrew Au, MD sent at 07/13/2020  2:36 PM EDT ----- Regarding: hospital f/u Good afternoon,  Please schedule this patient for a hospital follow up on Monday. He will need an ambulatory pulse ox. May also need pharmacy assistance to apply for [place] assistance program.  Thanks! Josh

## 2020-07-17 ENCOUNTER — Encounter: Payer: 59 | Admitting: Internal Medicine

## 2020-07-18 ENCOUNTER — Ambulatory Visit (INDEPENDENT_AMBULATORY_CARE_PROVIDER_SITE_OTHER): Payer: 59 | Admitting: Internal Medicine

## 2020-07-18 VITALS — BP 131/95 | HR 102 | Temp 98.3°F | Wt >= 6400 oz

## 2020-07-18 DIAGNOSIS — G629 Polyneuropathy, unspecified: Secondary | ICD-10-CM

## 2020-07-18 DIAGNOSIS — E662 Morbid (severe) obesity with alveolar hypoventilation: Secondary | ICD-10-CM | POA: Diagnosis not present

## 2020-07-18 DIAGNOSIS — J9602 Acute respiratory failure with hypercapnia: Secondary | ICD-10-CM

## 2020-07-18 DIAGNOSIS — I5031 Acute diastolic (congestive) heart failure: Secondary | ICD-10-CM

## 2020-07-18 DIAGNOSIS — J9601 Acute respiratory failure with hypoxia: Secondary | ICD-10-CM | POA: Diagnosis not present

## 2020-07-18 DIAGNOSIS — M545 Low back pain, unspecified: Secondary | ICD-10-CM | POA: Diagnosis not present

## 2020-07-18 DIAGNOSIS — I10 Essential (primary) hypertension: Secondary | ICD-10-CM

## 2020-07-18 MED ORDER — NAPROXEN 500 MG PO TBEC
500.0000 mg | DELAYED_RELEASE_TABLET | Freq: Every day | ORAL | 0 refills | Status: DC | PRN
Start: 2020-07-18 — End: 2020-12-23

## 2020-07-18 MED ORDER — GABAPENTIN 300 MG PO CAPS: 300.0000 mg | ORAL_CAPSULE | Freq: Three times a day (TID) | ORAL | 0 refills | Status: DC

## 2020-07-18 MED ORDER — NAPROXEN 500 MG PO TBEC: 500.0000 mg | DELAYED_RELEASE_TABLET | Freq: Two times a day (BID) | ORAL | 0 refills | Status: DC
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
## 2020-07-18 NOTE — Progress Notes (Signed)
SATURATION QUALIFICATIONS: (This note is used to comply with regulatory documentation for home oxygen)  Patient Saturations on Room Air at Rest = 95%  Patient Saturations on Room Air while Ambulating = 91-92 % Pt unable to ambulate further

## 2020-07-18 NOTE — Patient Instructions (Addendum)
Thank you for allowing [place] to provide your care today. Today we discussed your hospitalization.    I have ordered labs for you. I will call if any are abnormal.    I have also sent in a script for naproxen 500 mg daily as needed for severe pain.   I also have put in a referral for a sleep study and to our pharmacist who can help with cost assistance of your medications.  Today we made no changes to your medications.    Please follow-up in 1-2 months.    Should you have any questions or concerns please call the internal medicine clinic at [phone].

## 2020-07-19 DIAGNOSIS — E662 Morbid (severe) obesity with alveolar hypoventilation: Secondary | ICD-10-CM | POA: Insufficient documentation

## 2020-07-19 NOTE — Addendum Note (Signed)
Addended by: Truddie Crumble on: 07/19/2020 04:54 PM   Modules accepted: Orders

## 2020-07-19 NOTE — Assessment & Plan Note (Addendum)
Patient endorses acute on chronic lower back pain.  States that he has a history of a bulging disc.  Per chart review, thoracic imaging showed chronic wedging of T9 and spondylosis.  States that he has been trying Tylenol and ibuprofen without definite relief.  No recent injuries or trauma.  States that when he stands for long periods the pain is worse.  Discussed realistic goals and expectations of acute on chronic lower back pain.  Recommended that he continue physical activity.  Discussed repeating spine radiographs to see if prior findings have worsened.  Also recommended short course of NSAID therapy to help him manage this acute flareup.  Again discussed realistic goals that this is to help his functional status.  Per chart review, patient was started on duloxetine back in January however never picked up her refill.  We will reconsider this if pain does not improve over the next few weeks.  Assessment and plan: Acute on chronic lower back pain  -Follow-up thoracic and lumbar radiographs -Conservative management with continued physical therapy and a short course of naproxen 500 mg daily, 20 tablets -Consider restarting duloxetine 30 mg daily

## 2020-07-19 NOTE — Assessment & Plan Note (Signed)
See above under acute respiratory failure with hypoxia and hypercarbia.

## 2020-07-19 NOTE — Progress Notes (Signed)
Internal Medicine Clinic Attending ° °Case discussed with Dr. [name]  At the time of the visit.  We reviewed the resident’s history and exam and pertinent patient test results.  I agree with the assessment, diagnosis, and plan of care documented in the resident’s note.  ° °

## 2020-07-19 NOTE — Assessment & Plan Note (Signed)
Patient continues to endorse chronic bilateral foot pain and burning sensation.  His hemoglobin A1c is within normal limits.  In the past his TSH folate and B12 were within normal limits.  We will recheck these labs today.  Can also sitter restarting duloxetine 30 mg daily.  In the meantime sent in a prescription for gabapentin 300 mg 3 times daily.  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    -Recheck vitamin B12 TSH and folate -Gabapentin 300 mg 3 times daily -Consider restarting duloxetine 30 mg daily

## 2020-07-19 NOTE — Assessment & Plan Note (Signed)
Patient was recently hospitalized for new diagnosis of acute diastolic heart failure and acute hypoxemic hypercarbic respiratory failure.  Echo showed preserved ejection fraction.  Patient was started on furosemide 80 mg daily and dapagliflozin 10 mg daily.  He is tolerating these medications well.  Currently on 3 L of supplemental oxygen.  Denies any shortness of breath, chest pain or lower extremity swelling.  Discussed checking a renal function panel today due to starting new medications however patient is deferring at this time.  Plan: Continue furosemide 80 mg daily Continue dapagliflozin 10 mg daily Follow-up BMP, lab orders pending for future visit

## 2020-07-19 NOTE — Assessment & Plan Note (Signed)
BP Readings from Last 3 Encounters:  07/18/20 (!) 131/95  07/13/20 120/77  07/06/20 (!) 144/86   Patient was previously on losartan-hctz 50-12.5 mg daily. During recent hospitalization his blood pressures were labile and his BP medications were held. Only slightly above goal at visit. Denies any chest pain, dizziness, leg swelling or SOB. At this time discussed continuing to monitor his blood pressures. If they continue to uptrend and remain elevated at follow up visits, recommend resuming losartan-hctz 50-12.5 mg.  Plan: Continue to hold losartan-hctz 50-12.5 mg  Can resume if BP remains elevated at follow up visits

## 2020-07-19 NOTE — Assessment & Plan Note (Signed)
Patient was recently hospitalized on 4/14 to 07/13/2020 for the diagnosis of diastolic heart failure with preserved ejection fraction and acute respiratory failure with hypoxia and hypercarbia.  He required BiPAP during hospitalization.  He was discharged home on 3 L of supplemental oxygen and Dulera for presumed asthma exacerbation during hospitalization.  He denies any shortness of breath.  States that he is ready to return to work.  He was also discharged home with a trilogy machine.  States he cannot afford the copayment on this machine.  Suspect obesity hypoventilation syndrome as well as obstructive sleep apnea.  Patient will need a sleep study.  Pulse oxygenation was checked while ambulating during today's visit.  While ambulating on room air saturations were 91 to 92% with an increase in his heart rate to the 120s.  He was only able to ambulate a very short distance.  Plan: Continue to 3 L of supplemental oxygen therapy [place] as tolerated Referral to [facility] neurologic for sleep study

## 2020-07-21 ENCOUNTER — Other Ambulatory Visit: Payer: Self-pay | Admitting: Internal Medicine

## 2020-07-21 DIAGNOSIS — J9601 Acute respiratory failure with hypoxia: Secondary | ICD-10-CM

## 2020-07-21 MED ORDER — ADVAIR HFA 115-21 MCG/ACT IN AERO: 2.0000 | INHALATION_SPRAY | Freq: Two times a day (BID) | RESPIRATORY_TRACT | 5 refills | Status: DC

## 2020-07-25 ENCOUNTER — Telehealth: Payer: Self-pay

## 2020-07-25 NOTE — Telephone Encounter (Signed)
Left voice message explaining that in order to submit his patient assistance application to [place], they will need his proof of income and out of pocket expenses for 2022.   Gave call back number [phone]

## 2020-08-02 ENCOUNTER — Encounter: Payer: Self-pay | Admitting: *Deleted

## 2020-08-23 ENCOUNTER — Telehealth: Payer: Self-pay | Admitting: Internal Medicine

## 2020-08-23 NOTE — Telephone Encounter (Signed)
Pt's mother called to stated that his DME company has called to state they will be coming to pick up his Oxygen and Ci PAP machine due to lack of payment.   Please call back

## 2020-08-25 ENCOUNTER — Telehealth: Payer: Self-pay | Admitting: *Deleted

## 2020-08-25 NOTE — Chronic Care Management (AMB) (Signed)
  Care Management   Outreach Note  08/25/2020 Name: Joe Carter MRN: [id number] DOB: September 22, 1980  Referred by: Mike Craze, DO Reason for referral : Care Coordination (Initial outreach to schedule referral with BSW was unsuccessful)   An unsuccessful telephone outreach was attempted today. The patient was referred to the case management team for assistance with care management and care coordination.   Follow Up Plan: A HIPAA compliant phone message was left for the patient providing contact information and requesting a return call. The care management team will reach out to the patient again over the next 7 days. If patient returns call to provider office, please advise to call [place] at [phone].  [place] Management

## 2020-08-25 NOTE — Telephone Encounter (Signed)
I called mothers number but it went to voicemail. I spoke with our case manger to see what she will be able to do  [place], [place] C6/3/202210:23 AM

## 2020-08-29 NOTE — Chronic Care Management (AMB) (Signed)
  Care Management   Outreach Note  08/29/2020 Name: Joe Carter MRN: [id number] DOB: 11/14/1980  Referred by: Mike Craze, DO Reason for referral : Care Coordination (Initial outreach to schedule referral with BSW was unsuccessful)   A second telephone outreach was attempted today. The patient was referred to the case management team for assistance with care management and care coordination.   Follow Up Plan: The care management team will reach out to the patient again over the next 2 days. If patient returns call to provider office, please advise to call [place] at [phone].  [place] Management

## 2020-08-30 ENCOUNTER — Ambulatory Visit: Payer: Self-pay | Admitting: Licensed Clinical Social Worker

## 2020-08-30 NOTE — Chronic Care Management (AMB) (Signed)
  Care Management   Note  08/30/2020 Name: XZAYVION VAETH MRN: [id number] DOB: 03/01/1981  Joe Carter is a 40 y.o. year old male who is a primary care patient of [name], Areeg N, DO. I reached out to Penelope Coop by phone today in response to a referral sent by Mr. Estell Harpin PCP, Dr. Laural Golden.   Mr. Rumery was given information about care management services today including:  1. Care management services include personalized support from designated clinical staff supervised by his physician, including individualized plan of care and coordination with other care providers 2. 24/7 contact phone numbers for assistance for urgent and routine care needs. 3. The patient may stop care management services at any time by phone call to the office staff.  Patient agreed to services and verbal consent obtained.   Follow up plan: Telephone appointment with care management team member scheduled for:09/08/2020  [place] Management

## 2020-08-30 NOTE — Chronic Care Management (AMB) (Signed)
  Care Management   Social Work Visit Note  08/30/2020 Name: Joe Carter MRN: [id number] DOB: 1980-09-06  MARCHELLO Carter is a 40 y.o. year old male who sees [name], Areeg N, DO for primary care. The care management team was consulted for assistance with care management and care coordination needs related to Financial Difficulties related to CPAP Machine   Patient was given the following information about care management and care coordination services today, agreed to services, and gave verbal consent: 1.care management/care coordination services include personalized support from designated clinical staff supervised by their physician, including individualized plan of care and coordination with other care providers 2. 24/7 contact phone numbers for assistance for urgent and routine care needs. 3. The patient may stop care management/care coordination services at any time by phone call to the office staff.  Engaged with patient by telephone for follow up visit in response to provider referral for social work chronic care management and care coordination services.  Assessment: Review of patient history, allergies, and health status during evaluation of patient need for care management/care coordination services.    Interventions:  . SW received request from clinical team to assist patient.  . Patient was not returning phone calls to the CCM team to schedule initial visit with SW.  . SW successfully contacted patient and advised patient to contact Laverda Sorenson to consent to services.  . Patient advise he is at risk of losing CPAP machine.  . SW reviewed chart in preparation of submitting financial assistance request to [place] [email or web address]  . SW confirmed patient needs $455.59 to prevent losing his CPAP machine.  . SW received approval to request assistance from [place].  . SW will collaborate with Laverda Sorenson for scheduling initial visit with  SW.  SDOH (Social Determinants of Health) assessments performed: No     Plan:  . patient will work with BSW to address needs related to Financial constraints  . contacting the CCM team Laverda Sorenson) to consent to receive services.  . The patient has been provided with contact information for the Managed Medicaid care management team and has been advised to call with any health related questions or concerns.   Milus Height, [place]  Social Worker [facility] Care Management  [phone]

## 2020-09-04 ENCOUNTER — Other Ambulatory Visit: Payer: Self-pay | Admitting: Internal Medicine

## 2020-09-04 DIAGNOSIS — G629 Polyneuropathy, unspecified: Secondary | ICD-10-CM

## 2020-09-04 MED ORDER — GABAPENTIN 300 MG PO CAPS: 300.0000 mg | ORAL_CAPSULE | Freq: Three times a day (TID) | ORAL | 0 refills | Status: DC

## 2020-09-04 NOTE — Telephone Encounter (Signed)
Refill Request    gabapentin (NEURONTIN) 300 MG capsule (Expired   [id number] [place], [place] - [address] (Ph: [phone]

## 2020-09-08 ENCOUNTER — Telehealth: Payer: Self-pay | Admitting: Licensed Clinical Social Worker

## 2020-09-08 NOTE — Telephone Encounter (Signed)
  Care Management   Follow Up Note   09/08/2020 Name: JAVONI LUCKEN MRN: [id number] DOB: 21-Oct-1980   Referred by: Mike Craze, DO Reason for referral : No chief complaint on file.   An unsuccessful telephone outreach was attempted today. The patient was referred to the case management team for assistance with care management and care coordination.   Follow Up Plan: If patient returns call to provider office, please advise to call [place]  * at [phone] *  SW will make another attempt within the next 30-days.  Milus Height, [place]  Social Worker [facility] Care Management  [phone]

## 2020-09-18 ENCOUNTER — Telehealth: Payer: Self-pay

## 2020-09-18 ENCOUNTER — Encounter: Payer: 59 | Admitting: Internal Medicine

## 2020-09-26 ENCOUNTER — Encounter: Payer: Self-pay | Admitting: *Deleted

## 2020-10-02 ENCOUNTER — Institutional Professional Consult (permissible substitution): Payer: 59 | Admitting: Neurology

## 2020-10-09 NOTE — Addendum Note (Signed)
Addended by: Riesa Pope on: 10/09/2020 04:56 PM   Modules accepted: Orders

## 2020-11-20 ENCOUNTER — Telehealth: Payer: Self-pay | Admitting: Internal Medicine
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
## 2020-11-20 NOTE — Telephone Encounter (Signed)
Pt calling to report he has a sore on his right foot and a big Scab over it.  Pt offered several appointments for this week and he was unable to sch.  Pt has some concerns.  Please call back.

## 2020-11-29 ENCOUNTER — Ambulatory Visit (INDEPENDENT_AMBULATORY_CARE_PROVIDER_SITE_OTHER): Payer: Self-pay | Admitting: Internal Medicine

## 2020-11-29 VITALS — BP 130/67 | HR 86 | Temp 98.1°F | Wt >= 6400 oz

## 2020-11-29 DIAGNOSIS — R7303 Prediabetes: Secondary | ICD-10-CM

## 2020-11-29 DIAGNOSIS — L97512 Non-pressure chronic ulcer of other part of right foot with fat layer exposed: Secondary | ICD-10-CM

## 2020-11-29 DIAGNOSIS — E119 Type 2 diabetes mellitus without complications: Secondary | ICD-10-CM

## 2020-11-29 LAB — POCT GLYCOSYLATED HEMOGLOBIN (HGB A1C): Hemoglobin A1C: 6.6 % — AB (ref 4.0–5.6)

## 2020-11-29 LAB — GLUCOSE, CAPILLARY: Glucose-Capillary: 185 mg/dL — ABNORMAL HIGH (ref 70–99)

## 2020-11-29 MED ORDER — DOXYCYCLINE HYCLATE 100 MG PO CAPS: 100.0000 mg | ORAL_CAPSULE | Freq: Two times a day (BID) | ORAL | 0 refills | Status: AC

## 2020-11-29 NOTE — Assessment & Plan Note (Addendum)
Last A1c 6.3 (prediabetes) in January of 2022. Patient now presents with ulcer on the top of his right foot, although concern for diabetic foot wound is less likely given this new diagnosis and a diabetic foot wound would likely present after microvascular changes have occured. Patient has limited sensation in his lower extremities secondary to neuropathy. A1c today 6.6 and random blood glucose >180.   Plan: - Will adjust meds at next visit

## 2020-11-29 NOTE — Patient Instructions (Signed)
Thank you, [name] for allowing [place] to provide your care today. Today we discussed: Right foot wound/ulcer: Sent in an antibiotic, doxycycline, to your pharmacy. Please pick this up and take it twice a day for 7 days. I have also sent in a referral for the wound care specialists so they can help to take care of your wound and appropriately wrap it. We are also checking some lab work today and will call you if any results are abnormal    I have ordered the following labs for you:   Lab Orders         CBC no Diff         Sed Rate (ESR)         CRP (C-Reactive Protein)         POC Hbg A1C       Referrals ordered today:    Referral Orders         AMB referral to wound care center      I have ordered the following medication/changed the following medications:   Stop the following medications: There are no discontinued medications.   Start the following medications: Meds ordered this encounter  Medications   doxycycline (VIBRAMYCIN) 100 MG capsule    Sig: Take 1 capsule (100 mg total) by mouth 2 (two) times daily for 7 days.    Dispense:  14 capsule    Refill:  0     Follow up:  1 month     Remember: If you do not hear from the wound care specialists in a week, please give our clinic a call   Should you have any questions or concerns please call the internal medicine clinic at [phone].     Buddy Duty, D.O. [place]
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
## 2020-11-29 NOTE — Assessment & Plan Note (Deleted)
Last A1c 6.3 in January of 2022. Patient now presents with ulcer on the top of his right foot, with concern for diabetic foot wound. Patient has limited sensation in his lower extremities secondary to neuropathy.   Plan: - Recheck A1c today
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
## 2020-12-01 LAB — CBC
Hematocrit: 45.2 % (ref 37.5–51.0)
Hemoglobin: 15 g/dL (ref 13.0–17.7)
MCH: 31.3 pg (ref 26.6–33.0)
MCHC: 33.2 g/dL (ref 31.5–35.7)
MCV: 94 fL (ref 79–97)
Platelets: 278 10*3/uL (ref 150–450)
RBC: 4.8 x10E6/uL (ref 4.14–5.80)
RDW: 12.7 % (ref 11.6–15.4)
WBC: 9.7 10*3/uL (ref 3.4–10.8)

## 2020-12-01 LAB — C-REACTIVE PROTEIN: CRP: 28 mg/L — ABNORMAL HIGH (ref 0–10)

## 2020-12-01 LAB — SEDIMENTATION RATE: Sed Rate: 49 mm/hr — ABNORMAL HIGH (ref 0–15)

## 2020-12-11 NOTE — Progress Notes (Signed)
Internal Medicine Clinic Attending  I saw and evaluated the patient.  I personally confirmed the key portions of the history and exam documented by Dr. Raymondo Band and I reviewed pertinent patient test results.  The assessment, diagnosis, and plan were formulated together and I agree with the documentation in the resident's note. Because this is not a pressure injury (friction vs burn more likely as wound began with clear fluid filled blister on foot dorsum), pressure wound staging does not apply.  Periwound area is darkened and foot is edematous.  Agree with antibiotics and wound care f/u.

## 2020-12-12 ENCOUNTER — Emergency Department (HOSPITAL_COMMUNITY): Payer: Self-pay

## 2020-12-12 ENCOUNTER — Encounter (HOSPITAL_COMMUNITY): Payer: Self-pay | Admitting: Emergency Medicine

## 2020-12-12 ENCOUNTER — Emergency Department (HOSPITAL_COMMUNITY)
Admission: EM | Admit: 2020-12-12 | Discharge: 2020-12-13 | Discharge status: Against Medical Advice | Payer: Self-pay | Attending: Emergency Medicine | Admitting: Emergency Medicine

## 2020-12-12 DIAGNOSIS — J441 Chronic obstructive pulmonary disease with (acute) exacerbation: Secondary | ICD-10-CM | POA: Insufficient documentation

## 2020-12-12 DIAGNOSIS — I110 Hypertensive heart disease with heart failure: Secondary | ICD-10-CM | POA: Insufficient documentation

## 2020-12-12 DIAGNOSIS — I5033 Acute on chronic diastolic (congestive) heart failure: Secondary | ICD-10-CM | POA: Diagnosis present

## 2020-12-12 DIAGNOSIS — Z79899 Other long term (current) drug therapy: Secondary | ICD-10-CM | POA: Insufficient documentation

## 2020-12-12 DIAGNOSIS — E662 Morbid (severe) obesity with alveolar hypoventilation: Secondary | ICD-10-CM | POA: Insufficient documentation

## 2020-12-12 DIAGNOSIS — Z20822 Contact with and (suspected) exposure to covid-19: Secondary | ICD-10-CM | POA: Insufficient documentation

## 2020-12-12 DIAGNOSIS — F17210 Nicotine dependence, cigarettes, uncomplicated: Secondary | ICD-10-CM | POA: Insufficient documentation

## 2020-12-12 DIAGNOSIS — I509 Heart failure, unspecified: Secondary | ICD-10-CM

## 2020-12-12 LAB — CBC WITH DIFFERENTIAL/PLATELET
Abs Immature Granulocytes: 0.05 10*3/uL (ref 0.00–0.07)
Basophils Absolute: 0.1 10*3/uL (ref 0.0–0.1)
Basophils Relative: 1 %
Eosinophils Absolute: 0.2 10*3/uL (ref 0.0–0.5)
Eosinophils Relative: 2 %
HCT: 46.8 % (ref 39.0–52.0)
Hemoglobin: 14.3 g/dL (ref 13.0–17.0)
Immature Granulocytes: 0 %
Lymphocytes Relative: 19 %
Lymphs Abs: 2.2 10*3/uL (ref 0.7–4.0)
MCH: 31.8 pg (ref 26.0–34.0)
MCHC: 30.6 g/dL (ref 30.0–36.0)
MCV: 104 fL — ABNORMAL HIGH (ref 80.0–100.0)
Monocytes Absolute: 0.7 10*3/uL (ref 0.1–1.0)
Monocytes Relative: 6 %
Neutro Abs: 8.2 10*3/uL — ABNORMAL HIGH (ref 1.7–7.7)
Neutrophils Relative %: 72 %
Platelets: 250 10*3/uL (ref 150–400)
RBC: 4.5 MIL/uL (ref 4.22–5.81)
RDW: 14.5 % (ref 11.5–15.5)
WBC: 11.4 10*3/uL — ABNORMAL HIGH (ref 4.0–10.5)
nRBC: 0 % (ref 0.0–0.2)

## 2020-12-12 LAB — COMPREHENSIVE METABOLIC PANEL
ALT: 19 U/L (ref 0–44)
AST: 18 U/L (ref 15–41)
Albumin: 3.8 g/dL (ref 3.5–5.0)
Alkaline Phosphatase: 71 U/L (ref 38–126)
Anion gap: 8 (ref 5–15)
BUN: 7 mg/dL (ref 6–20)
CO2: 35 mmol/L — ABNORMAL HIGH (ref 22–32)
Calcium: 9.4 mg/dL (ref 8.9–10.3)
Chloride: 100 mmol/L (ref 98–111)
Creatinine, Ser: 0.83 mg/dL (ref 0.61–1.24)
GFR, Estimated: >60 mL/min (ref >60)
Glucose, Bld: 102 mg/dL — ABNORMAL HIGH (ref 70–99)
Potassium: 3.4 mmol/L — ABNORMAL LOW (ref 3.5–5.1)
Sodium: 143 mmol/L (ref 135–145)
Total Bilirubin: 0.5 mg/dL (ref 0.3–1.2)
Total Protein: 8.1 g/dL (ref 6.5–8.1)

## 2020-12-12 LAB — BRAIN NATRIURETIC PEPTIDE: B Natriuretic Peptide: 133.9 pg/mL — ABNORMAL HIGH (ref 0.0–100.0)

## 2020-12-12 LAB — RESP PANEL BY RT-PCR (FLU A&B, COVID) ARPGX2
Influenza A by PCR: NEGATIVE
Influenza B by PCR: NEGATIVE
SARS Coronavirus 2 by RT PCR: NEGATIVE

## 2020-12-12 LAB — CBG MONITORING, ED: Glucose-Capillary: 128 mg/dL — ABNORMAL HIGH (ref 70–99)

## 2020-12-12 MED ORDER — IPRATROPIUM-ALBUTEROL 0.5-2.5 (3) MG/3ML IN SOLN
3.0000 mL | Freq: Once | RESPIRATORY_TRACT | Status: DC
  Filled 2020-12-12: qty 3

## 2020-12-12 MED ORDER — INSULIN ASPART 100 UNIT/ML IJ SOLN
0.0000 [IU] | INTRAMUSCULAR | Status: DC
  Filled 2020-12-12: qty 0.15

## 2020-12-12 MED ORDER — FUROSEMIDE 10 MG/ML IJ SOLN
60.0000 mg | Freq: Once | INTRAMUSCULAR | Status: DC
  Filled 2020-12-12: qty 8

## 2020-12-12 NOTE — ED Notes (Addendum)
Pt refused IV placement. Refused 2nd covid test. Refused admission. Says " I know I shouldn't have came here. I have oxygen at home that plugs into the wall just not a carry around tank. I don't want to stay". AMA form signed.

## 2020-12-12 NOTE — ED Triage Notes (Signed)
Pt states he has had SOB x 3 days. Took inhaler today. Has not had oxygen since Spring as he is supposed to be on home O2. 86% RA. Alert and oriented.

## 2020-12-12 NOTE — ED Provider Notes (Cosign Needed)
Emergency Medicine Provider Triage Evaluation Note  Joe Carter , a 40 y.o. male  was evaluated in triage.  Pt complains of shortness of breath over the last couple of days.  States he ran out of oxygen a couple of months ago secondary to insurance issues.  Shortness of breath occurs at rest.  Denies any chest pain fever, chills, cough, sore throat, leg pain, leg swelling.    Review of Systems  Positive:  Negative: See above   Physical Exam  BP (!) 150/99   Pulse 85   Temp 98 F (36.7 C) (Oral)   Resp (!) 24   SpO2 94%  Gen:   Awake, no distress, obese   Resp:  Normal effort  MSK:   Moves extremities without difficulty  Other:    Medical Decision Making  Medically screening exam initiated at 3:19 PM.  Appropriate orders placed.  Joe Carter was informed that the remainder of the evaluation will be completed by another provider, this initial triage assessment does not replace that evaluation, and the importance of remaining in the ED until their evaluation is complete.  Patient states he feels back to his normal self on oxygen in the department.   Hendricks Limes, [place] 12/12/20 1520
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
## 2020-12-13 DIAGNOSIS — I5033 Acute on chronic diastolic (congestive) heart failure: Secondary | ICD-10-CM

## 2020-12-13 DIAGNOSIS — I509 Heart failure, unspecified: Secondary | ICD-10-CM

## 2020-12-13 DIAGNOSIS — E662 Morbid (severe) obesity with alveolar hypoventilation: Secondary | ICD-10-CM

## 2020-12-15 ENCOUNTER — Telehealth: Payer: Self-pay

## 2020-12-15 NOTE — Telephone Encounter (Signed)
Received TC from patient's mother, Patrici Ranks [facility]) who states that her son went to the ED at [facility] on 12/12/20 and needs a f/u appt.  EC states her son is sleeping and by the time he wakes up [facility] will be closed so she is calling to make an appt.  EC has an appt on 12/21/20 and is requesting an appt for her son/patient on the same date.  Appt made for 12/21/20 @ 1045 w/ blue team/ Dr. Lisabeth Devoid. SChaplin, RN,BSN

## 2020-12-21 ENCOUNTER — Other Ambulatory Visit: Payer: Self-pay

## 2020-12-21 ENCOUNTER — Ambulatory Visit (INDEPENDENT_AMBULATORY_CARE_PROVIDER_SITE_OTHER): Payer: Self-pay | Admitting: Student

## 2020-12-21 ENCOUNTER — Encounter: Payer: Self-pay | Admitting: Student

## 2020-12-21 ENCOUNTER — Ambulatory Visit (HOSPITAL_COMMUNITY)
Admission: RE | Admit: 2020-12-21 | Discharge: 2020-12-21 | Disposition: A | Discharge status: Home / Self Care | Payer: Self-pay | Source: Ambulatory Visit | Attending: Internal Medicine | Admitting: Internal Medicine

## 2020-12-21 VITALS — BP 116/89 | HR 98 | Temp 98.5°F | Ht 77.0 in | Wt >= 6400 oz

## 2020-12-21 DIAGNOSIS — E119 Type 2 diabetes mellitus without complications: Secondary | ICD-10-CM

## 2020-12-21 DIAGNOSIS — I5031 Acute diastolic (congestive) heart failure: Secondary | ICD-10-CM

## 2020-12-21 LAB — CBC
HCT: 49.1 % (ref 39.0–52.0)
Hemoglobin: 15.1 g/dL (ref 13.0–17.0)
MCH: 31.4 pg (ref 26.0–34.0)
MCHC: 30.8 g/dL (ref 30.0–36.0)
MCV: 102.1 fL — ABNORMAL HIGH (ref 80.0–100.0)
Platelets: 257 10*3/uL (ref 150–400)
RBC: 4.81 MIL/uL (ref 4.22–5.81)
RDW: 15 % (ref 11.5–15.5)
WBC: 10.9 10*3/uL — ABNORMAL HIGH (ref 4.0–10.5)
nRBC: 0 % (ref 0.0–0.2)

## 2020-12-21 LAB — BASIC METABOLIC PANEL
Anion gap: 11 (ref 5–15)
BUN: 7 mg/dL (ref 6–20)
CO2: 34 mmol/L — ABNORMAL HIGH (ref 22–32)
Calcium: 9 mg/dL (ref 8.9–10.3)
Chloride: 96 mmol/L — ABNORMAL LOW (ref 98–111)
Creatinine, Ser: 0.97 mg/dL (ref 0.61–1.24)
GFR, Estimated: >60 mL/min (ref >60)
Glucose, Bld: 102 mg/dL — ABNORMAL HIGH (ref 70–99)
Potassium: 3.2 mmol/L — ABNORMAL LOW (ref 3.5–5.1)
Sodium: 141 mmol/L (ref 135–145)

## 2020-12-21 MED ORDER — TORSEMIDE 5 MG PO TABS: 80.0000 mg | ORAL_TABLET | Freq: Every day | ORAL | Status: DC

## 2020-12-21 MED ORDER — TORSEMIDE 5 MG PO TABS
80.0000 mg | ORAL_TABLET | Freq: Once | ORAL | Status: AC
  Administered 2020-12-21: 80 mg via ORAL

## 2020-12-21 NOTE — Assessment & Plan Note (Addendum)
Patient presents with shortness of breath.  He endorses orthopnea, dyspnea on exertion.  States he has been out of his heart failure medications including furosemide and dapagliflozin for a couple of months due to losing his insurance.  He was previously on 3 L supplemental oxygen but has not been able to use oxygen consistently due to losing his insurance.  He does mention he has an oxygen device at home that he uses as needed for shortness of breath, but is not portable.  Is not sure how much oxygen he uses and notes he uses it about 2 to 3 hours a day.  Was seen in the ED on 9/20 for similar symptoms.  BNP mildly elevated at 133, renal function was stable mild interstitial edema and atelectasis on chest x-ray.  Noted to be in heart failure exacerbation appears that he was feeling better with oxygen and was discharged home.  On exam patient oxygen saturation initially 90% on room air improved to 97% with 2 L oxygen.  We attempted to ambulate him on oxygen but oxygen saturations decreased to 88%.  Weight is 513 pounds in office today.  Appears he is gained 28 pounds since discharge from hospital in April for CHF exacerbation.  Bilateral lower extremities 2+ pitting edema.  Abdomen is distended and dull to percussion.  Lung sounds are diminished bilaterally.  Unable to assess JVD given body habitus.  Suspect that he is in acute heart failure exacerbation secondary to being unable to afford his medications.  Given social barriers and affording his medications and ongoing symptoms we will check CBC, BMP, EKG in clinic today recommended him to the hospital for further diuresis.  Plan Stat BMP CBC EKG We will administer 80 mg torsemide in clinic today Continue to wear his oxygen at home He will go home and and wait for bed for direct admission for CHF exacerbation

## 2020-12-21 NOTE — Patient Instructions (Signed)
We will direct admit you to the hospital for CHF exacerbation. Please use your oxygen while at home and return to the hospital when called.

## 2020-12-22 ENCOUNTER — Observation Stay (HOSPITAL_COMMUNITY): Payer: Self-pay

## 2020-12-22 ENCOUNTER — Observation Stay (HOSPITAL_COMMUNITY)
Admission: AD | Admit: 2020-12-22 | Discharge: 2020-12-23 | Disposition: A | Discharge status: Home / Self Care | Payer: Self-pay | Source: Ambulatory Visit | Attending: Internal Medicine | Admitting: Internal Medicine

## 2020-12-22 ENCOUNTER — Other Ambulatory Visit: Payer: Self-pay

## 2020-12-22 DIAGNOSIS — E876 Hypokalemia: Secondary | ICD-10-CM | POA: Insufficient documentation

## 2020-12-22 DIAGNOSIS — I10 Essential (primary) hypertension: Secondary | ICD-10-CM

## 2020-12-22 DIAGNOSIS — Z87891 Personal history of nicotine dependence: Secondary | ICD-10-CM | POA: Insufficient documentation

## 2020-12-22 DIAGNOSIS — I5033 Acute on chronic diastolic (congestive) heart failure: Principal | ICD-10-CM | POA: Insufficient documentation

## 2020-12-22 DIAGNOSIS — J9601 Acute respiratory failure with hypoxia: Secondary | ICD-10-CM

## 2020-12-22 DIAGNOSIS — I509 Heart failure, unspecified: Secondary | ICD-10-CM

## 2020-12-22 DIAGNOSIS — G629 Polyneuropathy, unspecified: Secondary | ICD-10-CM

## 2020-12-22 DIAGNOSIS — E1140 Type 2 diabetes mellitus with diabetic neuropathy, unspecified: Secondary | ICD-10-CM | POA: Insufficient documentation

## 2020-12-22 LAB — BASIC METABOLIC PANEL
Anion gap: 10 (ref 5–15)
BUN: 7 mg/dL (ref 6–20)
CO2: 37 mmol/L — ABNORMAL HIGH (ref 22–32)
Calcium: 8.7 mg/dL — ABNORMAL LOW (ref 8.9–10.3)
Chloride: 93 mmol/L — ABNORMAL LOW (ref 98–111)
Creatinine, Ser: 0.96 mg/dL (ref 0.61–1.24)
GFR, Estimated: >60 mL/min (ref >60)
Glucose, Bld: 132 mg/dL — ABNORMAL HIGH (ref 70–99)
Potassium: 3.3 mmol/L — ABNORMAL LOW (ref 3.5–5.1)
Sodium: 140 mmol/L (ref 135–145)

## 2020-12-22 LAB — CBC WITH DIFFERENTIAL/PLATELET
Abs Immature Granulocytes: 0.04 10*3/uL (ref 0.00–0.07)
Basophils Absolute: 0.1 10*3/uL (ref 0.0–0.1)
Basophils Relative: 1 %
Eosinophils Absolute: 0.2 10*3/uL (ref 0.0–0.5)
Eosinophils Relative: 2 %
HCT: 52 % (ref 39.0–52.0)
Hemoglobin: 16 g/dL (ref 13.0–17.0)
Immature Granulocytes: 0 %
Lymphocytes Relative: 23 %
Lymphs Abs: 2.1 10*3/uL (ref 0.7–4.0)
MCH: 31.4 pg (ref 26.0–34.0)
MCHC: 30.8 g/dL (ref 30.0–36.0)
MCV: 102 fL — ABNORMAL HIGH (ref 80.0–100.0)
Monocytes Absolute: 0.6 10*3/uL (ref 0.1–1.0)
Monocytes Relative: 7 %
Neutro Abs: 6.1 10*3/uL (ref 1.7–7.7)
Neutrophils Relative %: 67 %
Platelets: 262 10*3/uL (ref 150–400)
RBC: 5.1 MIL/uL (ref 4.22–5.81)
RDW: 15.1 % (ref 11.5–15.5)
WBC: 9.2 10*3/uL (ref 4.0–10.5)
nRBC: 0 % (ref 0.0–0.2)

## 2020-12-22 LAB — MAGNESIUM: Magnesium: 2 mg/dL (ref 1.7–2.4)

## 2020-12-22 LAB — GLUCOSE, CAPILLARY: Glucose-Capillary: 162 mg/dL — ABNORMAL HIGH (ref 70–99)

## 2020-12-22 LAB — BRAIN NATRIURETIC PEPTIDE: B Natriuretic Peptide: 145.6 pg/mL — ABNORMAL HIGH (ref 0.0–100.0)

## 2020-12-22 MED ORDER — ACETAMINOPHEN 325 MG PO TABS: 650.0000 mg | ORAL_TABLET | Freq: Four times a day (QID) | ORAL | Status: DC | PRN

## 2020-12-22 MED ORDER — DAPAGLIFLOZIN PROPANEDIOL 10 MG PO TABS
10.0000 mg | ORAL_TABLET | Freq: Every day | ORAL | Status: DC
  Administered 2020-12-22 – 2020-12-23 (×2): 10 mg via ORAL
  Filled 2020-12-22 (×2): qty 1

## 2020-12-22 MED ORDER — GABAPENTIN 300 MG PO CAPS
300.0000 mg | ORAL_CAPSULE | Freq: Three times a day (TID) | ORAL | Status: DC
  Administered 2020-12-22 – 2020-12-23 (×4): 300 mg via ORAL
  Filled 2020-12-22 (×4): qty 1

## 2020-12-22 MED ORDER — ACETAMINOPHEN 650 MG RE SUPP: 650.0000 mg | Freq: Four times a day (QID) | RECTAL | Status: DC | PRN

## 2020-12-22 MED ORDER — ADULT MULTIVITAMIN W/MINERALS CH
1.0000 | ORAL_TABLET | Freq: Every day | ORAL | Status: DC
  Administered 2020-12-22 – 2020-12-23 (×2): 1 via ORAL
  Filled 2020-12-22 (×2): qty 1

## 2020-12-22 MED ORDER — DULOXETINE HCL 30 MG PO CPEP: 30.0000 mg | ORAL_CAPSULE | Freq: Every day | ORAL | Status: DC

## 2020-12-22 MED ORDER — DULOXETINE HCL 30 MG PO CPEP
30.0000 mg | ORAL_CAPSULE | Freq: Every day | ORAL | Status: DC
  Administered 2020-12-23: 30 mg via ORAL
  Filled 2020-12-22: qty 1

## 2020-12-22 MED ORDER — FLUTICASONE FUROATE-VILANTEROL 200-25 MCG/INH IN AEPB
1.0000 | INHALATION_SPRAY | Freq: Every day | RESPIRATORY_TRACT | Status: DC
  Administered 2020-12-22 – 2020-12-23 (×2): 1 via RESPIRATORY_TRACT
  Filled 2020-12-22: qty 28

## 2020-12-22 MED ORDER — POTASSIUM CHLORIDE CRYS ER 20 MEQ PO TBCR
40.0000 meq | EXTENDED_RELEASE_TABLET | Freq: Once | ORAL | Status: AC
  Administered 2020-12-22: 40 meq via ORAL
  Filled 2020-12-22: qty 2

## 2020-12-22 MED ORDER — ATORVASTATIN CALCIUM 40 MG PO TABS: 40.0000 mg | ORAL_TABLET | Freq: Every day | ORAL | Status: DC

## 2020-12-22 MED ORDER — FUROSEMIDE 10 MG/ML IJ SOLN
80.0000 mg | Freq: Two times a day (BID) | INTRAMUSCULAR | Status: DC
  Administered 2020-12-22 – 2020-12-23 (×2): 80 mg via INTRAVENOUS
  Filled 2020-12-22 (×2): qty 8

## 2020-12-22 MED ORDER — ATORVASTATIN CALCIUM 40 MG PO TABS
40.0000 mg | ORAL_TABLET | Freq: Every day | ORAL | Status: DC
  Administered 2020-12-22 – 2020-12-23 (×2): 40 mg via ORAL
  Filled 2020-12-22 (×2): qty 1

## 2020-12-22 MED ORDER — POLYETHYLENE GLYCOL 3350 17 G PO PACK: 17.0000 g | PACK | Freq: Every day | ORAL | Status: DC | PRN

## 2020-12-22 MED ORDER — SODIUM CHLORIDE 0.9% FLUSH
3.0000 mL | Freq: Two times a day (BID) | INTRAVENOUS | Status: DC
  Administered 2020-12-22 – 2020-12-23 (×3): 3 mL via INTRAVENOUS

## 2020-12-22 MED ORDER — ENOXAPARIN SODIUM 40 MG/0.4ML IJ SOSY
40.0000 mg | PREFILLED_SYRINGE | INTRAMUSCULAR | Status: DC
  Administered 2020-12-22: 40 mg via SUBCUTANEOUS
  Filled 2020-12-22: qty 0.4

## 2020-12-22 NOTE — Progress Notes (Signed)
PT placed on cpap for the night.
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
## 2020-12-22 NOTE — Consult Note (Signed)
[facility] Nurse Consult Note: Patient receiving care in [place] Reason for Consult: right foot wound Wound type: Healing ulcer on the dorsal side of the right foot  Measurement: 2 cm x 1 cm  Dressing procedure/placement/frequency: Apply a small piece of Aquacel Advantage over the wound and secure with a foam dressing. Change Aquacel Daily.  Monitor the wound area(s) for worsening of condition such as: Signs/symptoms of infection, increase in size, development of or worsening of odor, development of pain, or increased pain at the affected locations.   Notify the medical team if any of these develop.  Thank you for the consult. [place] nurse will not follow at this time.   Please re-consult the [place] team if needed.  Cathlean Marseilles Tamala Julian, MSN, RN, [place], Lysle Pearl, [facility] Wound Treatment Associate Pager [phone]
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
## 2020-12-22 NOTE — Assessment & Plan Note (Signed)
Reports wound on foot is healing. Denies any drainage or bleeding. On exam appears to be healing well. Size has decreased based on prior photos. No surrounding edema, erythema, or warmth. No apparent infection. He has follow up with wound care at the end of October.

## 2020-12-23 ENCOUNTER — Other Ambulatory Visit: Payer: Self-pay

## 2020-12-23 DIAGNOSIS — I509 Heart failure, unspecified: Secondary | ICD-10-CM

## 2020-12-23 LAB — CBC WITH DIFFERENTIAL/PLATELET
Abs Immature Granulocytes: 0.05 10*3/uL (ref 0.00–0.07)
Basophils Absolute: 0.1 10*3/uL (ref 0.0–0.1)
Basophils Relative: 0 %
Eosinophils Absolute: 0.2 10*3/uL (ref 0.0–0.5)
Eosinophils Relative: 2 %
HCT: 50.5 % (ref 39.0–52.0)
Hemoglobin: 15.2 g/dL (ref 13.0–17.0)
Immature Granulocytes: 0 %
Lymphocytes Relative: 17 %
Lymphs Abs: 1.9 10*3/uL (ref 0.7–4.0)
MCH: 31.3 pg (ref 26.0–34.0)
MCHC: 30.1 g/dL (ref 30.0–36.0)
MCV: 103.9 fL — ABNORMAL HIGH (ref 80.0–100.0)
Monocytes Absolute: 0.8 10*3/uL (ref 0.1–1.0)
Monocytes Relative: 7 %
Neutro Abs: 8.4 10*3/uL — ABNORMAL HIGH (ref 1.7–7.7)
Neutrophils Relative %: 74 %
Platelets: 255 10*3/uL (ref 150–400)
RBC: 4.86 MIL/uL (ref 4.22–5.81)
RDW: 15.3 % (ref 11.5–15.5)
WBC: 11.3 10*3/uL — ABNORMAL HIGH (ref 4.0–10.5)
nRBC: 0 % (ref 0.0–0.2)

## 2020-12-23 LAB — BASIC METABOLIC PANEL
Anion gap: 8 (ref 5–15)
BUN: 8 mg/dL (ref 6–20)
CO2: 38 mmol/L — ABNORMAL HIGH (ref 22–32)
Calcium: 8.4 mg/dL — ABNORMAL LOW (ref 8.9–10.3)
Chloride: 94 mmol/L — ABNORMAL LOW (ref 98–111)
Creatinine, Ser: 1.06 mg/dL (ref 0.61–1.24)
GFR, Estimated: >60 mL/min (ref >60)
Glucose, Bld: 107 mg/dL — ABNORMAL HIGH (ref 70–99)
Potassium: 3.7 mmol/L (ref 3.5–5.1)
Sodium: 140 mmol/L (ref 135–145)

## 2020-12-23 LAB — BRAIN NATRIURETIC PEPTIDE: B Natriuretic Peptide: 47.4 pg/mL (ref 0.0–100.0)

## 2020-12-23 MED ORDER — FUROSEMIDE 80 MG PO TABS: 80.0000 mg | ORAL_TABLET | Freq: Every day | ORAL | 0 refills | Status: DC

## 2020-12-23 MED ORDER — GABAPENTIN 300 MG PO CAPS: 300.0000 mg | ORAL_CAPSULE | Freq: Three times a day (TID) | ORAL | 0 refills | Status: DC

## 2020-12-23 MED ORDER — ENOXAPARIN SODIUM 100 MG/ML IJ SOSY: 100.0000 mg | PREFILLED_SYRINGE | INTRAMUSCULAR | Status: DC

## 2020-12-23 MED ORDER — ATORVASTATIN CALCIUM 40 MG PO TABS: 40.0000 mg | ORAL_TABLET | Freq: Every day | ORAL | 0 refills | Status: DC

## 2020-12-23 MED ORDER — DULOXETINE HCL 30 MG PO CPEP: 30.0000 mg | ORAL_CAPSULE | Freq: Every day | ORAL | 0 refills | Status: DC

## 2020-12-23 MED ORDER — ADVAIR HFA 115-21 MCG/ACT IN AERO: 2.0000 | INHALATION_SPRAY | Freq: Two times a day (BID) | RESPIRATORY_TRACT | 0 refills | Status: DC

## 2020-12-23 MED ORDER — DAPAGLIFLOZIN PROPANEDIOL 10 MG PO TABS: 10.0000 mg | ORAL_TABLET | Freq: Every day | ORAL | 0 refills | Status: DC

## 2020-12-23 NOTE — Discharge Summary (Signed)
Name: Joe Carter MRN: [id number] DOB: [birth year] 40 y.o. PCP: Mike Craze, DO  Date of Admission: 12/22/2020  1:55 PM Date of Discharge: 12/23/2020 Attending Physician: Angelica Pou, MD  Discharge Diagnosis: Subacute Heart Failure Exacerbation  Discharge Medications: Allergies as of 12/23/2020   No Known Allergies      Medication List     STOP taking these medications    naproxen 500 MG EC tablet Commonly known as: EC NAPROSYN       TAKE these medications    acetaminophen 500 MG tablet Commonly known as: TYLENOL Take 500 mg by mouth every 6 (six) hours as needed for headache.   Advair HFA 115-21 MCG/ACT inhaler Generic drug: fluticasone-salmeterol Inhale 2 puffs into the lungs 2 (two) times daily.   atorvastatin 40 MG tablet Commonly known as: Lipitor Take 1 tablet (40 mg total) by mouth daily.   dapagliflozin propanediol 10 MG Tabs tablet Commonly known as: FARXIGA Take 1 tablet (10 mg total) by mouth daily.   DULoxetine 30 MG capsule Commonly known as: Cymbalta Take 1 capsule (30 mg total) by mouth daily.   furosemide 80 MG tablet Commonly known as: Lasix Take 1 tablet (80 mg total) by mouth daily.   gabapentin 300 MG capsule Commonly known as: NEURONTIN Take 1 capsule (300 mg total) by mouth 3 (three) times daily.               Durable Medical Equipment  (From admission, onward)           Start     Ordered   12/23/20 1316  For home use only DME oxygen  Once       Question Answer Comment  Length of Need 12 Months   Mode or (Route) Nasal cannula   Liters per Minute 2   Frequency Continuous (stationary and portable oxygen unit needed)   Oxygen delivery system Gas      12/23/20 1315              Discharge Care Instructions  (From admission, onward)           Start     Ordered   12/23/20 0000  Discharge wound care:       Comments: Keep wound on your foot clean and dry!   12/23/20 1548             Disposition and follow-up:   [name] was discharged from [facility] in Good condition.  At the hospital follow up visit please address:  1.    Patient no longer has insurance.  Please send all future medications to the [facility] outpatient pharmacy under the [facility] program  Please titrate Lasix to maintain dry weight of 218 kg  2.  Labs / imaging needed at time of follow-up: BMP  3.  Pending labs/ test needing follow-up: None  Follow-up Appointments:   Recommend follow-up with PCP in 5 to 7 days  Hospital Course by problem list:  1. Subacute Heart Failure Exacerbation Patient admitted to [facility] directly from the [facility] internal medicine clinic secondary to subacute heart failure in the setting of medication noncompliance for the last 2 months.  Patient states he lost his insurance and was no longer able to afford his medications.  He has been gradually gaining weight and experiencing lower extremity edema, orthopnea that is worse than prior. He received 80 mg of PO Torsemide on 9/29 in the clinic. On hospital admission, weight  decreased 10 kg from the day prior. IV Lasix 80 mg BID was started with brisk diuresis to 220 kg. He was discharged on his prior medication regimen including Lasix 80 mg QD and Farxiga 10 mg QD. He continued to require his home supplemental O2.   Discharge Exam:   BP 110/70   Pulse (!) 134   Temp 98.2 F (36.8 C)   Resp 18   Ht _0  (1.956 m)   Wt (!) 220.5 kg   SpO2 95%   BMI 57.64 kg/m  Discharge exam:  Physical Exam Vitals and nursing note reviewed.  Constitutional:      General: He is not in acute distress.    Appearance: He is morbidly obese.  HENT:     Head: Normocephalic and atraumatic.     Mouth/Throat:     Mouth: Mucous membranes are moist.     Pharynx: Oropharynx is clear.  Eyes:     Conjunctiva/sclera: Conjunctivae normal.     Pupils: Pupils are equal, round, and reactive to light.   Cardiovascular:     Rate and Rhythm: Normal rate and regular rhythm.  Pulmonary:     Effort: Pulmonary effort is normal.     Breath sounds: Wheezing (expiratory) present. No rales.  Musculoskeletal:     Right lower leg: Edema (trace) present.     Left lower leg: Edema (trace) present.  Skin:    General: Skin is warm and dry.  Neurological:     General: No focal deficit present.     Mental Status: He is oriented to person, place, and time. Mental status is at baseline.  Psychiatric:        Mood and Affect: Mood normal.        Behavior: Behavior normal.   Pertinent Labs, Studies, and Procedures:  BMP Latest Ref Rng & Units 12/23/2020 12/22/2020 12/21/2020  Glucose 70 - 99 mg/dL 107(H) 132(H) 102(H)  BUN 6 - 20 mg/dL _1 Creatinine 0.61 - 1.24 mg/dL 1.06 0.96 0.97  BUN/Creat Ratio 9 - 20 - - -  Sodium 135 - 145 mmol/L 140 140 141  Potassium 3.5 - 5.1 mmol/L 3.7 3.3(L) 3.2(L)  Chloride 98 - 111 mmol/L 94(L) 93(L) 96(L)  CO2 22 - 32 mmol/L 38(H) 37(H) 34(H)  Calcium 8.9 - 10.3 mg/dL 8.4(L) 8.7(L) 9.0   BNP (last 3 results) Recent Labs    12/12/20 1551 12/22/20 1532 12/23/20 0206  BNP 133.9* 145.6* 47.4   Discharge Instructions: Discharge Instructions     (HEART FAILURE PATIENTS) Call MD:  Anytime you have any of the following symptoms: 1) 3 pound weight gain in 24 hours or 5 pounds in 1 week 2) shortness of breath, with or without a dry hacking cough 3) swelling in the hands, feet or stomach 4) if you have to sleep on extra pillows at night in order to breathe.   Complete by: As directed    Call MD for:  difficulty breathing, headache or visual disturbances   Complete by: As directed    Call MD for:  extreme fatigue   Complete by: As directed    Call MD for:  persistant dizziness or light-headedness   Complete by: As directed    Call MD for:  persistant nausea and vomiting   Complete by: As directed    Call MD for:  severe uncontrolled pain   Complete by: As directed     Call MD for:  temperature >100.4   Complete by: As  directed    Diet - low sodium heart healthy   Complete by: As directed    Discharge wound care:   Complete by: As directed    Keep wound on your foot clean and dry!   Increase activity slowly   Complete by: As directed       Signed: Dr. Jose Persia Internal Medicine PGY-3  Pager: [phone] After 5pm on weekdays and 1pm on weekends: On Call pager [phone]  12/23/2020, 3:48 PM

## 2020-12-23 NOTE — Discharge Instructions (Addendum)
[name], Joe Carter were admitted to the hospital for a heart failure flare up that we treated with IV Lasix. We suspect this flare was from missing medications for the last couple months. Refills [facility] sent to [facility] for you to pick up. Please make sure to restart your medications tomorrow. If you have any trouble, contact your primary care doctor's office.   We have also ordered oxygen for you to wear during the daytime. Continue to wear your BiPAP at night.  Please follow up with your primary care doctor in the next 5-7 days!   Thank you for allowing [place] to participate in your care and we wish you the best!   - Dr. Charleen Kirks

## 2020-12-23 NOTE — TOC Transition Note (Signed)
Transition of Care [facility]) - CM/SW Discharge Note   Patient Details  Name: Joe Carter MRN: [id number] Date of Birth: 04/01/1980  Transition [facility]) CM/SW Contact:  Zenon Mayo, RN Phone Number: 12/23/2020, 1:50 PM   Clinical Narrative:    Patient is for dc today,  NCM assisted him with [employer] for medications and [employer] supplied the home oxygen thru charity .  Per [place] with [employer] they will set up the oxygen today. Patient did have oxygen with [employer] previously but no longer with insurance, so [employer] will be coming to pick up the oxygen that they supplied to the patient.  NCM informed patient to go to a pharmacy listed on the Match letter to get medications for 3.00 each, he understands. He has transportation home.   Final next level of care: Home/Self Care Barriers to Discharge: No Barriers Identified   Patient Goals and CMS Choice Patient states their goals for this hospitalization and ongoing recovery are:: return home   Choice offered to / list presented to : NA  Discharge Placement                       Discharge Plan and Services                DME Arranged: Oxygen DME Agency: [employer] Date DME Agency Contacted: 12/23/20 Time DME Agency Contacted: 42 Representative spoke with at DME Agency: Kim: NA          Social Determinants of Health ([place]) Interventions     Readmission Risk Interventions No flowsheet data found.

## 2020-12-23 NOTE — Progress Notes (Signed)
SATURATION QUALIFICATIONS: (This note is used to comply with regulatory documentation for home oxygen)  Patient Saturations on Room Air at Rest = 84%   Patient Saturations on 2 Liters of oxygen while Resting= 92%

## 2020-12-23 NOTE — TOC Progression Note (Addendum)
Transition of Care [facility]) - Progression Note    Patient Details  Name: Joe Carter MRN: [id number] Date of Birth: 06/28/1980  Transition of Care [facility]) CM/SW Contact  Zenon Mayo, RN Phone Number: 12/23/2020, 11:14 AM  Clinical Narrative:    NCM notified by MD that patient may possible be dc today, he will need medicaiton ast, he has no insurance,  NCM will assist with the Match Letter.  MD states he will also need home oxygen. NCM spoke with patient , he states he has home oxygen but the company has not picked up the DME yet, he has not made pmts on it due to lost of insurance.  [employer] will be picking up the oxygen they supplied to him due to nonpayment.  He will need charity oxygen thru [employer],  this NCM made referral to [facility] with [employer] for charity oxygen.         Expected Discharge Plan and Services                                                 Social Determinants of Health (SDOH) Interventions    Readmission Risk Interventions No flowsheet data found.

## 2020-12-24 NOTE — Care Management (Signed)
Patient mother at pharmacy, pharmacy cannot  process [name] called pharmacy they do not have hte paperwork. Called in [facility]. Called mother phone [phone] no answer, mailbox not set up, called patient number listed  Left confidential voice message  [place] Medication Assistance Card Name:   Joe Carter ID (MRN+ extra 0) [id number] [place]: [id number] RX Group: [id number] Discharge Date:  12/23/2020 Expiration Date:  12/30/2020 (must be filled within 7 days of discharge)

## 2020-12-24 NOTE — Progress Notes (Signed)
Post discharge note 12/23/2020 22:00  12/23/20   20:15 Per call from pt's mother, Joe Carter, pt has not received home delivery of O2, has only 1/2 a tank of O2 left, & has been unable to get an answer from [employer]. Per Parks Ranger, RN, pt was discharged with two O2 tanks that were delivered by [employer] to pt's room prior to discharge & [employer] was to deliver more O2 to home today. While speaking with Ms. Graeff, the patient Joe Carter, stated he finally spoke with someone from [employer] & they should be there in about 30 minutes with the O2 delivery.  20:20 Paged [place] on call, Joe Carter, to make aware of situation & request direct contact info for an [employer] representative.   20:42 Received call back from [facility] regarding contact info for the on call supervisor with [employer].  20:45 Spoke with [employer] on call supervisor, Joe Carter, regarding undelivered home O2, pt's current O2 amount & the communication difficulties with [employer] regarding said delivery. Per Zach, O2 is in route & should be delivered within ~ 30 minutes.  21:15 Per call to Joe Carter O2 delivery was received.

## 2021-01-01 NOTE — Progress Notes (Signed)
Internal Medicine Clinic Attending  Case discussed with Dr. Lisabeth Devoid  At the time of the visit.  We reviewed the resident's history and exam and pertinent patient test results.  I agree with the assessment, diagnosis, and plan of care documented in the resident's note.

## 2021-01-15 ENCOUNTER — Other Ambulatory Visit: Payer: Self-pay | Admitting: Internal Medicine

## 2021-01-15 DIAGNOSIS — G629 Polyneuropathy, unspecified: Secondary | ICD-10-CM

## 2021-01-18 ENCOUNTER — Encounter (HOSPITAL_BASED_OUTPATIENT_CLINIC_OR_DEPARTMENT_OTHER): Payer: Medicaid Other | Attending: Internal Medicine | Admitting: Internal Medicine

## 2021-01-18 ENCOUNTER — Other Ambulatory Visit: Payer: Self-pay

## 2021-01-18 DIAGNOSIS — E11621 Type 2 diabetes mellitus with foot ulcer: Secondary | ICD-10-CM | POA: Insufficient documentation

## 2021-01-18 DIAGNOSIS — E1142 Type 2 diabetes mellitus with diabetic polyneuropathy: Secondary | ICD-10-CM | POA: Insufficient documentation

## 2021-01-18 DIAGNOSIS — Z6841 Body Mass Index (BMI) 40.0 and over, adult: Secondary | ICD-10-CM | POA: Insufficient documentation

## 2021-01-18 DIAGNOSIS — L97518 Non-pressure chronic ulcer of other part of right foot with other specified severity: Secondary | ICD-10-CM | POA: Insufficient documentation

## 2021-01-18 NOTE — Progress Notes (Signed)
[name], [name] ([id number]) Visit Report for 01/18/2021 Abuse/Suicide Risk Screen Details Patient Name: Date of Service: Joe Carter, Joe Carter 01/18/2021 1:15 PM Medical Record Number: [id number] Patient Account Number: [id number] Date of Birth/Sex: Treating RN: 12-18-1980 (40 y.o. Male) Baruch Gouty Primary Care Yosef Krogh: [place] Other Clinician: Referring Ladene Allocca: Treating Fawzi Melman/Extender: Zena Amos in Treatment: 0 Abuse/Suicide Risk Screen Items Answer ABUSE RISK SCREEN: Has anyone close to you tried to hurt or harm you recentlyo No Do you feel uncomfortable with anyone in your familyo No Has anyone forced you do things that you didnt want to doo No Electronic Signature(s) Signed: 01/18/2021 5:29:08 PM By: Baruch Gouty RN, BSN Entered By: Baruch Gouty on 01/18/2021 13:32:33 -------------------------------------------------------------------------------- Activities of Daily Living Details Patient Name: Date of Service: Joe Carter 01/18/2021 1:15 PM Medical Record Number: [id number] Patient Account Number: [id number] Date of Birth/Sex: Treating RN: [birth year] (40 y.o. Male) Baruch Gouty Primary Care Khristin Keleher: [place] Other Clinician: Referring Ermon Sagan: Treating Cannen Dupras/Extender: Zena Amos in Treatment: 0 Activities of Daily Living Items Answer Activities of Daily Living (Please select one for each item) Drive Automobile Not Able T Medications ake Completely Able Use T elephone Completely Able Care for Appearance Completely Able Use T oilet Completely Able Bath / Shower Completely Able Dress Self Completely Able Feed Self Completely Able Walk Completely Able Get In / Out Bed Completely Able Housework Completely Able Prepare Meals Completely [place] for Self Completely Able Electronic Signature(s) Signed: 01/18/2021 5:29:08 PM By: Baruch Gouty RN, BSN Entered By: Baruch Gouty on 01/18/2021 13:33:12 -------------------------------------------------------------------------------- Education Screening Details Patient Name: Date of Service: Joe Peter A. 01/18/2021 1:15 PM Medical Record Number: [id number] Patient Account Number: [id number] Date of Birth/Sex: Treating RN: 04-28-1980 (40 y.o. Male) Baruch Gouty Primary Care Massimiliano Rohleder: [place] Other Clinician: Referring Anise Harbin: Treating Maycie Luera/Extender: Zena Amos in Treatment: 0 Primary Learner Assessed: Patient Learning Preferences/Education Level/Primary Language Learning Preference: Explanation, Demonstration, Printed Material Highest Education Level: College or Above Preferred Language: English Cognitive Barrier Language Barrier: No Translator Needed: No Memory Deficit: No Emotional Barrier: No Cultural/Religious Beliefs Affecting Medical Care: No Physical Barrier Impaired Vision: No Impaired Hearing: No Decreased Hand dexterity: No Knowledge/Comprehension Knowledge Level: High Comprehension Level: High Ability to understand written instructions: High Ability to understand verbal instructions: High Motivation Anxiety Level: Calm Cooperation: Cooperative Education Importance: Acknowledges Need Interest in Health Problems: Asks Questions Perception: Coherent Willingness to Engage in Self-Management High Activities: Readiness to Engage in Self-Management High Activities: Electronic Signature(s) Signed: 01/18/2021 5:29:08 PM By: Baruch Gouty RN, BSN Entered By: Baruch Gouty on 01/18/2021 13:33:40 -------------------------------------------------------------------------------- Fall Risk Assessment Details Patient Name: Date of Service: FA Joe Napoleon A. 01/18/2021 1:15 PM Medical Record Number: [id number] Patient Account Number: [id number] Date of Birth/Sex: Treating RN: [birth year] (40 y.o. Male)  Baruch Gouty Primary Care Shenetta Schnackenberg: [place] Other Clinician: Referring Mardie Kellen: Treating Osaze Hubbert/Extender: Zena Amos in Treatment: 0 Fall Risk Assessment Items Have you had 2 or more falls in the last 12 monthso 0 No Have you had any fall that resulted in injury in the last 12 monthso 0 No FALLS RISK SCREEN History of falling - immediate or within 3 months 0 No Secondary diagnosis (Do you have 2 or more medical diagnoseso) 0 No Ambulatory aid None/bed rest/wheelchair/nurse 0 Yes Crutches/cane/walker 0 No Furniture 0 No  Intravenous therapy Access/Saline/Heparin Lock 0 No Gait/Transferring Normal/ bed rest/ wheelchair 0 Yes Weak (short steps with or without shuffle, stooped but able to lift head while walking, may seek 0 No support from furniture) Impaired (short steps with shuffle, may have difficulty arising from chair, head down, impaired 0 No balance) Mental Status Oriented to own ability 0 Yes Electronic Signature(s) Signed: 01/18/2021 5:29:08 PM By: Baruch Gouty RN, BSN Entered By: Baruch Gouty on 01/18/2021 13:33:52 -------------------------------------------------------------------------------- Foot Assessment Details Patient Name: Date of Service: Joe Peter A. 01/18/2021 1:15 PM Medical Record Number: [id number] Patient Account Number: [id number] Date of Birth/Sex: Treating RN: 1980-08-14 (40 y.o. Male) Baruch Gouty Primary Care Ellsworth Waldschmidt: [place] Other Clinician: Referring Lativia Velie: Treating Otniel Hoe/Extender: Zena Amos in Treatment: 0 Foot Assessment Items Site Locations + = Sensation present, - = Sensation absent, C = Callus, U = Ulcer R = Redness, W = Warmth, M = Maceration, PU = Pre-ulcerative lesion F = Fissure, S = Swelling, D = Dryness Assessment Right: Left: Other Deformity: No No Prior Foot Ulcer: No No Prior Amputation: No No Charcot Joint: No  No Ambulatory Status: Ambulatory Without Help Gait: Steady Electronic Signature(s) Signed: 01/18/2021 5:29:08 PM By: Baruch Gouty RN, BSN Entered By: Baruch Gouty on 01/18/2021 13:38:01 -------------------------------------------------------------------------------- Nutrition Risk Screening Details Patient Name: Date of Service: Joe Peter A. 01/18/2021 1:15 PM Medical Record Number: [id number] Patient Account Number: [id number] Date of Birth/Sex: Treating RN: 1980/04/19 (40 y.o. Male) Baruch Gouty Primary Care Jenea Dake: [place] Other Clinician: Referring Neyland Pettengill: Treating Darrell Hauk/Extender: Zena Amos in Treatment: 0 Height (in): 77 Weight (lbs): 485 Body Mass Index (BMI): 57.5 Nutrition Risk Screening Items Score Screening NUTRITION RISK SCREEN: I have an illness or condition that made me change the kind and/or amount of food I eat 0 No I eat fewer than two meals per day 3 Yes I eat few fruits and vegetables, or milk products 0 No I have three or more drinks of beer, liquor or wine almost every day 0 No I have tooth or mouth problems that make it hard for me to eat 2 Yes I don't always have enough money to buy the food I need 0 No I eat alone most of the time 0 No I take three or more different prescribed or over-the-counter drugs a day 1 Yes Without wanting to, I have lost or gained 10 pounds in the last six months 0 No I am not always physically able to shop, cook and/or feed myself 0 No Nutrition Protocols Good Risk Protocol Moderate Risk Protocol Provide education on elevated blood High Risk Proctocol 0 sugars and impact on wound healing, as applicable Risk Level: High Risk Score: 6 Electronic Signature(s) Signed: 01/18/2021 5:29:08 PM By: Baruch Gouty RN, BSN Entered By: Baruch Gouty on 01/18/2021 13:34:49

## 2021-01-19 NOTE — Addendum Note (Signed)
Addended by: Buddy Duty on: 01/19/2021 03:20 AM   Modules accepted: Orders

## 2021-01-20 ENCOUNTER — Other Ambulatory Visit: Payer: Self-pay | Admitting: Internal Medicine

## 2021-01-20 DIAGNOSIS — J9601 Acute respiratory failure with hypoxia: Secondary | ICD-10-CM

## 2021-01-23 NOTE — Progress Notes (Signed)
Joe Carter ([id number]) Visit Report for 01/18/2021 Chief Complaint Document Details Patient Name: Date of Service: Carter, Joe 01/18/2021 1:15 PM Medical Record Number: [id number] Patient Account Number: [id number] Date of Birth/Sex: Treating RN: Dec 12, 1980 (40 y.o. Male) Deon Pilling Primary Care Provider: [place] Other Clinician: Referring Provider: Treating Provider/Extender: Zena Amos in Treatment: 0 Information Obtained from: Patient Chief Complaint 01/18/2021; patient is here for review of the wound on his dorsal right foot Electronic Signature(s) Signed: 01/23/2021 4:29:54 PM By: Linton Ham MD Entered By: Linton Ham on 01/18/2021 14:27:16 -------------------------------------------------------------------------------- Debridement Details Patient Name: Date of Service: Joe Peter A. 01/18/2021 1:15 PM Medical Record Number: [id number] Patient Account Number: [id number] Date of Birth/Sex: Treating RN: 02-Feb-1981 (40 y.o. Male) Deon Pilling Primary Care Provider: [place] Other Clinician: Referring Provider: Treating Provider/Extender: Zena Amos in Treatment: 0 Debridement Performed for Assessment: Wound #1 Right,Dorsal Foot Performed By: Physician Ricard Dillon., MD Debridement Type: Debridement Severity of Tissue Pre Debridement: Fat layer exposed Level of Consciousness (Pre-procedure): Awake and Alert Pre-procedure Verification/Time Out Yes - 14:00 Taken: Start Time: 14:05 Pain Control: Lidocaine 4% T opical Solution T Area Debrided (L x W): otal 1.4 (cm) x 2.8 (cm) = 3.92 (cm) Tissue and other material debrided: Viable, Non-Viable, Slough, Subcutaneous, Slough Level: Skin/Subcutaneous Tissue Debridement Description: Excisional Instrument: Curette Bleeding: Minimum Hemostasis Achieved: Pressure End Time: 14:07 Procedural Pain: 0 Post Procedural Pain:  0 Response to Treatment: Procedure was tolerated well Level of Consciousness (Post- Awake and Alert procedure): Post Debridement Measurements of Total Wound Length: (cm) 1.4 Width: (cm) 2.8 Depth: (cm) 0.1 Volume: (cm) 0.308 Character of Wound/Ulcer Post Debridement: Improved Severity of Tissue Post Debridement: Fat layer exposed Post Procedure Diagnosis Same as Pre-procedure Electronic Signature(s) Signed: 01/18/2021 5:43:20 PM By: Deon Pilling RN, BSN Signed: 01/23/2021 4:29:54 PM By: Linton Ham MD Entered By: Linton Ham on 01/18/2021 14:25:33 -------------------------------------------------------------------------------- HPI Details Patient Name: Date of Service: Joe Peter A. 01/18/2021 1:15 PM Medical Record Number: [id number] Patient Account Number: [id number] Date of Birth/Sex: Treating RN: May 27, 1980 (40 y.o. Male) Deon Pilling Primary Care Provider: Donetta Potts, A REEG Other Clinician: Referring Provider: Treating Provider/Extender: Zena Amos in Treatment: 0 History of Present Illness HPI Description: Admission 10/27 This is a 40 year old man who has had an area on the top of his right foot for 2 months. He says this started as a blister which he unroofed. He was seen primary care at the [facility] internal medicine clinic on 11/29/2020 and given doxycycline. He also had a CRP, sed rate CBC and he was given appointment to come here. Since then he has had problems with congestive heart failure requiring diuresis. The wound is not healed. He has been applying topical antibiotics. Past medical history includes congestive heart failure with preserved ejection fraction, obesity hypoventilation on CPAP morbid obesity and hypertension. The patient disputes that he is a diabetic I note that he is in list that is prediabetic in the last notes although his most recent hemoglobin A1c was 6.6 if confirmed that would establish a diagnosis of diabetes.  He has neuropathy. ABI in our clinic was 1.23 on the right and 1.21 on the left Electronic Signature(s) Signed: 01/23/2021 4:29:54 PM By: Linton Ham MD Entered By: Linton Ham on 01/18/2021 14:34:26 -------------------------------------------------------------------------------- Physical Exam Details Patient Name: Date of Service: Joe Peter A. 01/18/2021 1:15 PM Medical Record  Number: [id number] Patient Account Number: [id number] Date of Birth/Sex: Treating RN: 03/18/1981 (40 y.o. Male) Deon Pilling Primary Care Provider: [place] Other Clinician: Referring Provider: Treating Provider/Extender: Zena Amos in Treatment: 0 Constitutional Sitting or standing Blood Pressure is within target range for patient.. Pulse regular and within target range for patient.Marland Kitchen Respirations regular, non-labored and within target range.. Temperature is normal and within the target range for the patient.Marland Kitchen Appears in no distress. Cardiovascular Pulses are palpable. Integumentary (Hair, Skin) No primary skin lesions are seen. Notes Wound exam; he has a wound on the top of his right foot according to the patient is not changed in almost 2 months. He is a #3 to create the debride the surface of this. He does not seem to extend his Electronic Signature(s) Signed: 01/23/2021 4:29:54 PM By: Linton Ham MD Entered By: Linton Ham on 01/18/2021 14:35:21 -------------------------------------------------------------------------------- Physician Orders Details Patient Name: Date of Service: Joe Peter A. 01/18/2021 1:15 PM Medical Record Number: [id number] Patient Account Number: [id number] Date of Birth/Sex: Treating RN: Jul 10, 1980 (40 y.o. Male) Baruch Gouty Primary Care Provider: [place] Other Clinician: Referring Provider: Treating Provider/Extender: Zena Amos in Treatment: 0 Verbal / Phone Orders:  No Diagnosis Coding Follow-up Appointments ppointment in 1 week. - Dr. Dellia Nims Return A Bathing/ Shower/ Hygiene May shower with protection but do not get wound dressing(s) wet. Edema Control - Lymphedema / SCD / Other Elevate legs to the level of the heart or above for 30 minutes daily and/or when sitting, a frequency of: - throughout the day Avoid standing for long periods of time. Exercise regularly Wound Treatment Wound #1 - Foot Wound Laterality: Dorsal, Right Peri-Wound Care: Sween Lotion (Moisturizing lotion) 1 x Per Week/15 Days Discharge Instructions: Apply moisturizing lotion as directed Prim Dressing: Hydrofera Blue Classic Foam, 2x2 in 1 x Per Week/15 Days ary Discharge Instructions: Moisten with saline prior to applying to wound bed Secondary Dressing: Woven Gauze Sponge, Non-Sterile 4x4 in 1 x Per Week/15 Days Discharge Instructions: Apply over primary dressing as directed. Compression Wrap: ThreePress (3 layer compression wrap) 1 x Per Week/15 Days Discharge Instructions: Apply three layer compression as directed. Patient Medications llergies: No Known Allergies A Notifications Medication Indication Start End prior to debridement 01/18/2021 lidocaine DOSE topical 4 % cream - cream topical Electronic Signature(s) Signed: 01/18/2021 5:29:08 PM By: Baruch Gouty RN, BSN Signed: 01/23/2021 4:29:54 PM By: Linton Ham MD Entered By: Baruch Gouty on 01/18/2021 14:12:08 -------------------------------------------------------------------------------- Problem List Details Patient Name: Date of Service: Joe Peter A. 01/18/2021 1:15 PM Medical Record Number: [id number] Patient Account Number: [id number] Date of Birth/Sex: Treating RN: 29-Aug-1980 (40 y.o. Male) Deon Pilling Primary Care Provider: [place] Other Clinician: Referring Provider: Treating Provider/Extender: Zena Amos in Treatment: 0 Active  Problems ICD-10 Encounter Code Description Active Date MDM Diagnosis E11.621 Type 2 diabetes mellitus with foot ulcer 01/18/2021 No Yes L97.518 Non-pressure chronic ulcer of other part of right foot with other specified 01/18/2021 No Yes severity E11.42 Type 2 diabetes mellitus with diabetic polyneuropathy 01/18/2021 No Yes Inactive Problems Resolved Problems Electronic Signature(s) Signed: 01/23/2021 4:29:54 PM By: Linton Ham MD Entered By: Linton Ham on 01/18/2021 14:10:38 -------------------------------------------------------------------------------- Progress Note Details Patient Name: Date of Service: Joe Peter A. 01/18/2021 1:15 PM Medical Record Number: [id number] Patient Account Number: [id number] Date of Birth/Sex: Treating RN: 09/24/1980 (40 y.o. Male) Deon Pilling Primary Care Provider:  Donetta Potts, A REEG Other Clinician: Referring Provider: Treating Provider/Extender: Zena Amos in Treatment: 0 Subjective Chief Complaint Information obtained from Patient 01/18/2021; patient is here for review of the wound on his dorsal right foot History of Present Illness (HPI) Admission 10/27 This is a 40 year old man who has had an area on the top of his right foot for 2 months. He says this started as a blister which he unroofed. He was seen primary care at the [facility] internal medicine clinic on 11/29/2020 and given doxycycline. He also had a CRP, sed rate CBC and he was given appointment to come here. Since then he has had problems with congestive heart failure requiring diuresis. The wound is not healed. He has been applying topical antibiotics. Past medical history includes congestive heart failure with preserved ejection fraction, obesity hypoventilation on CPAP morbid obesity and hypertension. The patient disputes that he is a diabetic I note that he is in list that is prediabetic in the last notes although his most recent hemoglobin A1c was  6.6 if confirmed that would establish a diagnosis of diabetes. He has neuropathy. ABI in our clinic was 1.23 on the right and 1.21 on the left Patient History Information obtained from Patient. Allergies No Known Allergies Family History Cancer - Maternal Grandparents, Diabetes - Mother,Maternal Grandparents, Heart Disease - Mother, Hypertension - Mother,Siblings, Kidney Disease - Siblings, No family history of Hereditary Spherocytosis, Lung Disease, Seizures, Stroke, Thyroid Problems, Tuberculosis. Social History Current every day smoker - 1/2 PPD, Marital Status - Divorced, Alcohol Use - Never, Drug Use - Current History - occaisional TCH, Caffeine Use - Daily - soda, coffee. Medical History Eyes Denies history of Cataracts, Glaucoma Respiratory Patient has history of Sleep Apnea - CPAP Cardiovascular Patient has history of Congestive Heart Failure, Hypertension Endocrine Patient has history of Type II Diabetes Genitourinary Denies history of End Stage Renal Disease Integumentary (Skin) Denies history of History of Burn Neurologic Patient has history of Neuropathy Oncologic Denies history of Received Chemotherapy, Received Radiation Psychiatric Denies history of Anorexia/bulimia, Confinement Anxiety Patient is treated with Controlled Diet. Blood sugar is not tested. Hospitalization/Surgery History - appendectomy. - tonsillectomy/adnoidectomy. Medical A Surgical History Notes nd Constitutional Symptoms (General Health) morbid obesity Respiratory uses supplemental O2 PRN Neurologic chronic back pain Review of Systems (ROS) Constitutional Symptoms (General Health) Denies complaints or symptoms of Fatigue, Fever, Chills, Marked Weight Change. Eyes Denies complaints or symptoms of Dry Eyes, Vision Changes, Glasses / Contacts. Ear/Nose/Mouth/Throat Denies complaints or symptoms of Chronic sinus problems or rhinitis. Respiratory Complains or has symptoms of Shortness of  Breath. Denies complaints or symptoms of Chronic or frequent coughs. Cardiovascular Denies complaints or symptoms of Chest pain. Gastrointestinal Denies complaints or symptoms of Frequent diarrhea, Nausea, Vomiting. Endocrine Denies complaints or symptoms of Heat/cold intolerance. Genitourinary Denies complaints or symptoms of Frequent urination. Integumentary (Skin) Complains or has symptoms of Wounds - right foot. Musculoskeletal Denies complaints or symptoms of Muscle Pain, Muscle Weakness. Neurologic Complains or has symptoms of Numbness/parasthesias - feet and hands. Psychiatric Denies complaints or symptoms of Claustrophobia, Suicidal. Objective Constitutional Sitting or standing Blood Pressure is within target range for patient.. Pulse regular and within target range for patient.Marland Kitchen Respirations regular, non-labored and within target range.. Temperature is normal and within the target range for the patient.Marland Kitchen Appears in no distress. Vitals Time Taken: 1:22 PM, Height: 77 in, Source: Stated, Weight: 485 lbs, Source: Stated, BMI: 57.5, Temperature: 98.3 F, Pulse: 79 bpm, Respiratory Rate: 22 breaths/min, Blood  Pressure: 122/85 mmHg. Cardiovascular Pulses are palpable. General Notes: Wound exam; he has a wound on the top of his right foot according to the patient is not changed in almost 2 months. He is a #3 to create the debride the surface of this. He does not seem to extend his Integumentary (Hair, Skin) No primary skin lesions are seen. Wound #1 status is Open. Original cause of wound was Blister. The date acquired was: 11/23/2020. The wound is located on the Right,Dorsal Foot. The wound measures 1.4cm length x 2.6cm width x 0.1cm depth; 2.859cm^2 area and 0.286cm^3 volume. There is Fat Layer (Subcutaneous Tissue) exposed. There is no tunneling or undermining noted. There is a medium amount of serosanguineous drainage noted. The wound margin is flat and intact. There is large  (67-100%) red granulation within the wound bed. There is a small (1-33%) amount of necrotic tissue within the wound bed including Adherent Slough. 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 There was a Excisional Skin/Subcutaneous Tissue Debridement with a total area of 3.92 sq cm performed by Ricard Dillon., MD. With the following instrument(s): Curette to remove Viable and Non-Viable tissue/material. Material removed includes Subcutaneous Tissue and Slough and after achieving pain control using Lidocaine 4% Topical Solution. No specimens were taken. A time out was conducted at 14:00, prior to the start of the procedure. A Minimum amount of bleeding was controlled with Pressure. The procedure was tolerated well with a pain level of 0 throughout and a pain level of 0 following the procedure. Post Debridement Measurements: 1.4cm length x 2.8cm width x 0.1cm depth; 0.308cm^3 volume. Character of Wound/Ulcer Post Debridement is improved. Severity of Tissue Post Debridement is: Fat layer exposed. Post procedure Diagnosis Wound #1: Same as Pre-Procedure Pre-procedure diagnosis of Wound #1 is a Diabetic Wound/Ulcer of the Lower Extremity located on the Right,Dorsal Foot . There was a Three Layer Compression Therapy Procedure by Baruch Gouty, RN. Post procedure Diagnosis Wound #1: Same as Pre-Procedure 1. Fairly benign looking wound on the dorsal left foot. I used a #3 curette to clean off the surface of some gritty fibrinous debris. Subcutaneous debridement hemostasis with direct pressure 2. He has some degree of swelling in this  area this may be chronic venous insufficiency I did not see a lot of evidence of recurrent heart failure. 3. We dressed this with Hydrofera Blue and put him in 3 layer compression. I gave him instructions on what to watch for with the wrap on his leg including not getting it wet keeping the leg elevated etc. although I am not really sure he pay too much attention to me. I spent 35 minutes in review of this patient's past medical history, face-to-face evaluation and preparation of this record Plan Follow-up Appointments: Return Appointment in 1 week. - Dr. Dellia Nims Bathing/ Shower/ Hygiene: May shower with protection but do not get wound dressing(s) wet. Edema Control - Lymphedema / SCD / Other: Elevate legs to the level of the heart or above for 30 minutes daily and/or when sitting, a frequency of: - throughout the day Avoid standing for long periods of time. Exercise regularly The following medication(s) was prescribed: lidocaine topical 4 % cream cream  topical for prior to debridement was prescribed at facility WOUND #1: - Foot Wound Laterality: Dorsal, Right Peri-Wound Care: Sween Lotion (Moisturizing lotion) 1 x Per Week/15 Days Discharge Instructions: Apply moisturizing lotion as directed Prim Dressing: Hydrofera Blue Classic Foam, 2x2 in 1 x Per Week/15 Days ary Discharge Instructions: Moisten with saline prior to applying to wound bed Secondary Dressing: Woven Gauze Sponge, Non-Sterile 4x4 in 1 x Per Week/15 Days Discharge Instructions: Apply over primary dressing as directed. Compression Wrap: ThreePress (3 layer compression wrap) 1 x Per Week/15 Days Discharge Instructions: Apply three layer compression as directed. Electronic Signature(s) Signed: 01/23/2021 4:29:54 PM By: Linton Ham MD Entered By: Linton Ham on 01/18/2021 14:41:20 -------------------------------------------------------------------------------- HxROS Details Patient Name: Date of Service: Joe Peter A. 01/18/2021 1:15 PM Medical Record Number: [id number] Patient Account Number: [id number] Date of Birth/Sex: Treating RN: 1980-05-01 (40 y.o. Male) Baruch Gouty Primary Care Provider: [place] Other Clinician: Referring Provider: Treating Provider/Extender: Zena Amos in Treatment: 0 Information Obtained From Patient Constitutional Symptoms (General Health) Complaints and Symptoms: Negative for: Fatigue; Fever; Chills; Marked Weight Change Medical History: Past Medical History Notes: morbid obesity Eyes Complaints and Symptoms: Negative for: Dry Eyes; Vision Changes; Glasses / Contacts Medical History: Negative for: Cataracts; Glaucoma Ear/Nose/Mouth/Throat Complaints and Symptoms: Negative for: Chronic sinus problems or rhinitis Respiratory Complaints and Symptoms: Positive for: Shortness of Breath Negative for: Chronic or frequent coughs Medical History: Positive for: Sleep Apnea - CPAP Past Medical History Notes: uses supplemental O2 PRN Cardiovascular Complaints and Symptoms: Negative for: Chest pain Medical History: Positive for: Congestive Heart Failure; Hypertension Gastrointestinal Complaints and Symptoms: Negative for: Frequent diarrhea; Nausea; Vomiting Endocrine Complaints and Symptoms: Negative for: Heat/cold intolerance Medical History: Positive for: Type II Diabetes Time with diabetes: many years prediabetic Treated with: Diet Blood sugar tested every day: No Genitourinary Complaints and Symptoms: Negative for: Frequent urination Medical History: Negative for: End Stage Renal Disease Integumentary (Skin) Complaints and Symptoms: Positive for: Wounds - right foot Medical History: Negative for: History of Burn Musculoskeletal Complaints and Symptoms: Negative for: Muscle Pain; Muscle Weakness Neurologic Complaints and Symptoms: Positive for: Numbness/parasthesias - feet and hands Medical  History: Positive for: Neuropathy Past Medical History Notes: chronic back pain Psychiatric Complaints and Symptoms: Negative for: Claustrophobia; Suicidal Medical History: Negative for: Anorexia/bulimia; Confinement Anxiety Hematologic/Lymphatic Immunological Oncologic Medical History: Negative for: Received Chemotherapy; Received Radiation Immunizations Pneumococcal Vaccine: Received Pneumococcal Vaccination: No Implantable Devices None Hospitalization / Surgery History Type of Hospitalization/Surgery appendectomy tonsillectomy/adnoidectomy Family and Social History Cancer: Yes - Maternal Grandparents; Diabetes: Yes - Mother,Maternal Grandparents; Heart Disease: Yes - Mother; Hereditary Spherocytosis: No; Hypertension: Yes - Mother,Siblings; Kidney Disease: Yes - Siblings; Lung Disease: No; Seizures: No; Stroke: No; Thyroid Problems: No; Tuberculosis: No; Current every day smoker - 1/2 PPD; Marital Status - Divorced; Alcohol Use: Never; Drug Use: Current History - occaisional TCH; Caffeine Use: Daily - soda, coffee; Financial Concerns: No; Food, Clothing or Shelter Needs: No; Support System Lacking: No; 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 Facility Procedures CPT4 Code: [id number] Description: [place] VISIT-LEV 3 EST PT Modifier: 25 Quantity: 1 CPT4 Code: [id number] Description: 37342 - DEB SUBQ TISSUE 20 SQ CM/< ICD-10 Diagnosis Description L97.518 Non-pressure chronic ulcer of other part of right foot with other specified sever Modifier: ity Quantity: 1 Physician Procedures : CPT4 Code Description Modifier [id number] [place] PHYS LEVEL 3 NEW PT 25 ICD-10 Diagnosis Description E11.621 Type 2 diabetes mellitus with foot ulcer L97.518 Non-pressure chronic ulcer of other part of right foot with other specified severity E11.42 Type 2  diabetes mellitus with diabetic polyneuropathy Quantity: 1 : [id number] - WC PHYS SUBQ TISS 20 SQ CM ICD-10 Diagnosis Description L97.518 Non-pressure chronic ulcer of other part of right foot with other specified severity Quantity: 1 Electronic Signature(s) Signed: 01/23/2021 4:29:54 PM By: Linton Ham MD Entered By: Linton Ham on 01/18/2021 14:41:46

## 2021-01-23 NOTE — Progress Notes (Signed)
Joe Carter, Joe Carter ([id number]) Visit Report for 01/18/2021 Allergy List Details Patient Name: Date of Service: Joe Carter, Joe Carter 01/18/2021 1:15 PM Medical Record Number: [id number] Patient Account Number: [id number] Date of Birth/Sex: Treating RN: [birth year] (40 y.o. Male) Baruch Gouty Primary Care Ashleen Demma: [place] Other Clinician: Referring Boy Delamater: Treating Maki Hege/Extender: Zena Amos in Treatment: 0 Allergies Active Allergies No Known Allergies Allergy Notes Electronic Signature(s) Signed: 01/18/2021 5:29:08 PM By: Baruch Gouty RN, BSN Entered By: Baruch Gouty on 01/18/2021 13:23:21 -------------------------------------------------------------------------------- [place] Details Patient Name: Date of Service: Joe Peter A. 01/18/2021 1:15 PM Medical Record Number: [id number] Patient Account Number: [id number] Date of Birth/Sex: Treating RN: 1980-07-05 (40 y.o. Male) Baruch Gouty Primary Care Errica Dutil: [place] Other Clinician: Referring Elon Lomeli: Treating Zaley Talley/Extender: Zena Amos in Treatment: 0 Visit Information Patient Arrived: Ambulatory Arrival Time: 13:16 Accompanied By: self Transfer Assistance: None Patient Identification Verified: Yes Secondary Verification Process Completed: Yes Patient Requires Transmission-Based Precautions: No Patient Has Alerts: No Electronic Signature(s) Signed: 01/18/2021 5:29:08 PM By: Baruch Gouty RN, BSN Entered By: Baruch Gouty on 01/18/2021 13:22:09 -------------------------------------------------------------------------------- Clinic Level of Care Assessment Details Patient Name: Date of Service: Joe Carter, Joe A. 01/18/2021 1:15 PM Medical Record Number: [id number] Patient Account Number: [id number] Date of Birth/Sex: Treating RN: 1981/02/14 (40 y.o. Male) Baruch Gouty Primary Care Eduard Penkala: [place] Other  Clinician: Referring Marie Borowski: Treating Xane Amsden/Extender: Zena Amos in Treatment: 0 Clinic Level of Care Assessment Items TOOL 1 Quantity Score _0  - 0 Use when EandM and Procedure is performed on INITIAL visit ASSESSMENTS - Nursing Assessment / Reassessment X- 1 20 General Physical Exam (combine w/ comprehensive assessment (listed just below) when performed on new pt. evals) X- 1 25 Comprehensive Assessment (HX, ROS, Risk Assessments, Wounds Hx, etc.) ASSESSMENTS - Wound and Skin Assessment / Reassessment _1  - 0 Dermatologic / Skin Assessment (not related to wound area) ASSESSMENTS - Ostomy and/or Continence Assessment and Care _2  - 0 Incontinence Assessment and Management _3  - 0 Ostomy Care Assessment and Management (repouching, etc.) PROCESS - Coordination of Care X - Simple Patient / Family Education for ongoing care 1 15 _4  - 0 Complex (extensive) Patient / Family Education for ongoing care X- 1 10 Staff obtains Programmer, systems, Records, T Results / Process Orders est _5  - 0 Staff telephones HHA, Nursing Homes / Clarify orders / etc _6  - 0 Routine Transfer to another Facility (non-emergent condition) _7  - 0 Routine Hospital Admission (non-emergent condition) X- 1 15 New Admissions / Biomedical engineer / Ordering NPWT Apligraf, etc. , _8  - 0 Emergency Hospital Admission (emergent condition) PROCESS - Special Needs _9  - 0 Pediatric / Minor Patient Management _10  - 0 Isolation Patient Management _11  - 0 Hearing / Language / Visual special needs _12  - 0 Assessment of Community assistance (transportation, D/C planning, etc.) _13  - 0 Additional assistance / Altered mentation _14  - 0 Support Surface(s) Assessment (bed, cushion, seat, etc.) INTERVENTIONS - Miscellaneous _15  - 0 External ear exam _16  - 0 Patient Transfer (multiple staff / Civil Service fast streamer / Similar devices) _17  - 0 Simple Staple / Suture removal (25 or less) _18  - 0 Complex Staple  / Suture removal (26 or more) _19  - 0 Hypo/Hyperglycemic Management (do not check if billed separately) X- 1 15 Ankle / Brachial Index (ABI) - do not check if billed separately Has the patient been seen at the hospital within the last  three years: Yes Total Score: 100 Level Of Care: New/Established - Level 3 Electronic Signature(s) Signed: 01/18/2021 5:29:08 PM By: Baruch Gouty RN, BSN Entered By: Baruch Gouty on 01/18/2021 14:07:10 -------------------------------------------------------------------------------- Compression Therapy Details Patient Name: Date of Service: Joe Peter A. 01/18/2021 1:15 PM Medical Record Number: [id number] Patient Account Number: [id number] Date of Birth/Sex: Treating RN: May 19, 1980 (40 y.o. Male) Baruch Gouty Primary Care Tobi Leinweber: [place] Other Clinician: Referring Camiah Humm: Treating Daimian Sudberry/Extender: Zena Amos in Treatment: 0 Compression Therapy Performed for Wound Assessment: Wound #1 Right,Dorsal Foot Performed By: Clinician Baruch Gouty, RN Compression Type: Three Layer Post Procedure Diagnosis Same as Pre-procedure Electronic Signature(s) Signed: 01/18/2021 5:29:08 PM By: Baruch Gouty RN, BSN Entered By: Baruch Gouty on 01/18/2021 14:09:17 -------------------------------------------------------------------------------- Encounter Discharge Information Details Patient Name: Date of Service: Joe Peter A. 01/18/2021 1:15 PM Medical Record Number: [id number] Patient Account Number: [id number] Date of Birth/Sex: Treating RN: Apr 30, 1980 (40 y.o. Male) Baruch Gouty Primary Care Noha Milberger: [place] Other Clinician: Referring Eliud Polo: Treating Cordelia Bessinger/Extender: Zena Amos in Treatment: 0 Encounter Discharge Information Items Post Procedure Vitals Discharge Condition: Stable Temperature (F): 98.3 Ambulatory Status: Ambulatory Pulse (bpm):  79 Discharge Destination: Home Respiratory Rate (breaths/min): 20 Transportation: Other Blood Pressure (mmHg): 122/85 Accompanied By: self Schedule Follow-up Appointment: Yes Clinical Summary of Care: Patient Declined Notes [facility] tranportation Electronic Signature(s) Signed: 01/18/2021 5:29:08 PM By: Baruch Gouty RN, BSN Entered By: Baruch Gouty on 01/18/2021 14:28:29 -------------------------------------------------------------------------------- Lower Extremity Assessment Details Patient Name: Date of Service: Joe Peter A. 01/18/2021 1:15 PM Medical Record Number: [id number] Patient Account Number: [id number] Date of Birth/Sex: Treating RN: [birth year] (40 y.o. Male) Baruch Gouty Primary Care Jacklyne Baik: Donetta Potts, A REEG Other Clinician: Referring Jocelyn Lowery: Treating Christne Platts/Extender: Zena Amos in Treatment: 0 Edema Assessment Assessed: [Left: No] [Right: No] Edema: [Left: Ye] [Right: s] Calf Left: Right: Point of Measurement: From Medial Instep 53 cm Ankle Left: Right: Point of Measurement: From Medial Instep 29.5 cm Vascular Assessment Pulses: Dorsalis Pedis Palpable: [Right:Yes] Blood Pressure: Brachial: [Left:129] [Right:129] Ankle: [Left:Dorsalis Pedis: 156 1.21] [Right:Dorsalis Pedis: 159 1.23] Electronic Signature(s) Signed: 01/18/2021 5:29:08 PM By: Baruch Gouty RN, BSN Entered By: Baruch Gouty on 01/18/2021 13:52:52 -------------------------------------------------------------------------------- Multi Wound Chart Details Patient Name: Date of Service: Joe Peter A. 01/18/2021 1:15 PM Medical Record Number: [id number] Patient Account Number: [id number] Date of Birth/Sex: Treating RN: [birth year] (40 y.o. Male) Deon Pilling Primary Care Kailo Kosik: Donato Heinz REEG Other Clinician: Referring Zaakirah Kistner: Treating Gailene Youkhana/Extender: Zena Amos in Treatment: 0 Vital  Signs Height(in): 77 Pulse(bpm): 21 Weight(lbs): 485 Blood Pressure(mmHg): 122/85 Body Mass Index(BMI): 58 Temperature(F): 98.3 Respiratory Rate(breaths/min): 22 Photos: [N/A:N/A] Right, Dorsal Foot N/A N/A Wound Location: Blister N/A N/A Wounding Event: Diabetic Wound/Ulcer of the Lower N/A N/A Primary Etiology: Extremity Sleep Apnea, Congestive Heart N/A N/A Comorbid History: Failure, Hypertension, Type II Diabetes, Neuropathy 11/23/2020 N/A N/A Date Acquired: 0 N/A N/A Weeks of Treatment: Open N/A N/A Wound Status: 1.4x2.6x0.1 N/A N/A Measurements L x W x D (cm) 2.859 N/A N/A A (cm) : rea 0.286 N/A N/A Volume (cm) : 0.00% N/A N/A % Reduction in A rea: 0.00% N/A N/A % Reduction in Volume: Grade 1 N/A N/A Classification: Medium N/A N/A Exudate A mount: Serosanguineous N/A N/A Exudate Type: red, brown N/A N/A Exudate Color: Flat and Intact N/A N/A Wound Margin: Large (67-100%) N/A N/A Granulation A mount: Red  N/A N/A Granulation Quality: Small (1-33%) N/A N/A Necrotic A mount: Fat Layer (Subcutaneous Tissue): Yes N/A N/A Exposed Structures: Fascia: No Tendon: No Muscle: No Joint: No Bone: No Small (1-33%) N/A N/A Epithelialization: Debridement - Excisional N/A N/A Debridement: Pre-procedure Verification/Time Out 14:00 N/A N/A Taken: Lidocaine 4% Topical Solution N/A N/A Pain Control: Subcutaneous, Slough N/A N/A Tissue Debrided: Skin/Subcutaneous Tissue N/A N/A Level: 3.92 N/A N/A Debridement A (sq cm): rea Curette N/A N/A Instrument: Minimum N/A N/A Bleeding: Pressure N/A N/A Hemostasis A chieved: 0 N/A N/A Procedural Pain: 0 N/A N/A Post Procedural Pain: Procedure was tolerated well N/A N/A Debridement Treatment Response: 1.4x2.8x0.1 N/A N/A Post Debridement Measurements L x W x D (cm) 0.308 N/A N/A Post Debridement Volume: (cm) Compression Therapy N/A N/A Procedures Performed: Debridement Treatment Notes Electronic  Signature(s) Signed: 01/18/2021 5:43:20 PM By: Deon Pilling RN, BSN Signed: 01/23/2021 4:29:54 PM By: Linton Ham MD Entered By: Linton Ham on 01/18/2021 14:25:20 -------------------------------------------------------------------------------- Multi-Disciplinary Care Plan Details Patient Name: Date of Service: Joe Peter A. 01/18/2021 1:15 PM Medical Record Number: [id number] Patient Account Number: [id number] Date of Birth/Sex: Treating RN: [birth year] (40 y.o. Male) Baruch Gouty Primary Care Oluwadamilare Tobler: Donetta Potts, A REEG Other Clinician: Referring Nickolas Chalfin: Treating An Schnabel/Extender: Zena Amos in Treatment: 0 Multidisciplinary Care Plan reviewed with physician Active Inactive Nutrition Nursing Diagnoses: Impaired glucose control: actual or potential Potential for alteratiion in Nutrition/Potential for imbalanced nutrition Goals: Patient/caregiver will maintain therapeutic glucose control Date Initiated: 01/18/2021 Target Resolution Date: 02/15/2021 Goal Status: Active Interventions: Assess HgA1c results as ordered upon admission and as needed Assess patient nutrition upon admission and as needed per policy Provide education on elevated blood sugars and impact on wound healing Treatment Activities: Dietary management education, guidance and counseling : 01/18/2021 Notes: Wound/Skin Impairment Nursing Diagnoses: Impaired tissue integrity Knowledge deficit related to smoking impact on wound healing Knowledge deficit related to ulceration/compromised skin integrity Goals: Patient will demonstrate a reduced rate of smoking or cessation of smoking Date Initiated: 01/18/2021 Target Resolution Date: 02/15/2021 Goal Status: Active Patient/caregiver will verbalize understanding of skin care regimen Date Initiated: 01/18/2021 Target Resolution Date: 02/15/2021 Goal Status: Active Ulcer/skin breakdown will have a volume reduction of 30%  by week 4 Date Initiated: 01/18/2021 Target Resolution Date: 02/15/2021 Goal Status: Active Interventions: Assess patient/caregiver ability to obtain necessary supplies Assess patient/caregiver ability to perform ulcer/skin care regimen upon admission and as needed Assess ulceration(s) every visit Provide education on ulcer and skin care Treatment Activities: Skin care regimen initiated : 01/18/2021 Topical wound management initiated : 01/18/2021 Notes: Electronic Signature(s) Signed: 01/18/2021 5:29:08 PM By: Baruch Gouty RN, BSN Entered By: Baruch Gouty on 01/18/2021 14:06:02 -------------------------------------------------------------------------------- Pain Assessment Details Patient Name: Date of Service: Joe Peter A. 01/18/2021 1:15 PM Medical Record Number: [id number] Patient Account Number: [id number] Date of Birth/Sex: Treating RN: 26-Feb-1981 (40 y.o. Male) Baruch Gouty Primary Care Donnamaria Shands: [place] Other Clinician: Referring Hellena Pridgen: Treating Elliett Guarisco/Extender: Zena Amos in Treatment: 0 Active Problems Location of Pain Severity and Description of Pain Patient Has Paino No Site Locations Rate the pain. Rate the pain. Current Pain Level: 0 Pain Management and Medication Current Pain Management: Electronic Signature(s) Signed: 01/18/2021 5:29:08 PM By: Baruch Gouty RN, BSN Entered By: Baruch Gouty on 01/18/2021 13:47:28 -------------------------------------------------------------------------------- Patient/Caregiver Education Details Patient Name: Date of Service: Len Blalock 10/27/2022andnbsp1:15 PM Medical Record Number: [id number] Patient Account Number: [id number] Date of Birth/Gender: Treating RN: 01/11/1981 [id number]  y.o. Male) Baruch Gouty Primary Care Physician: Donetta Potts, A REEG Other Clinician: Referring Physician: Treating Physician/Extender: Zena Amos in  Treatment: 0 Education Assessment Education Provided To: Patient Education Topics Provided Elevated Blood Sugar/ Impact on Healing: Methods: Explain/Verbal Responses: Reinforcements needed, State content correctly Venous: Methods: Explain/Verbal Responses: Reinforcements needed, State content correctly Wound/Skin Impairment: Methods: Explain/Verbal Responses: Reinforcements needed, State content correctly Electronic Signature(s) Signed: 01/18/2021 5:29:08 PM By: Baruch Gouty RN, BSN Entered By: Baruch Gouty on 01/18/2021 14:06:38 -------------------------------------------------------------------------------- Wound Assessment Details Patient Name: Date of Service: Joe Peter A. 01/18/2021 1:15 PM Medical Record Number: [id number] Patient Account Number: [id number] Date of Birth/Sex: Treating RN: May 09, 1980 (40 y.o. Male) Baruch Gouty Primary Care Jiyaan Steinhauser: Donetta Potts, A REEG Other Clinician: Referring Rigley Niess: Treating Karem Tomaso/Extender: Zena Amos in Treatment: 0 Wound Status Wound Number: 1 Primary Diabetic Wound/Ulcer of the Lower Extremity Etiology: Wound Location: Right, Dorsal Foot Wound Open Wounding Event: Blister Status: Date Acquired: 11/23/2020 Comorbid Sleep Apnea, Congestive Heart Failure, Hypertension, Type II Weeks Of Treatment: 0 History: Diabetes, Neuropathy Clustered Wound: No Photos Wound Measurements Length: (cm) 1.4 Width: (cm) 2.6 Depth: (cm) 0.1 Area: (cm) 2.859 Volume: (cm) 0.286 % Reduction in Area: 0% % Reduction in Volume: 0% Epithelialization: Small (1-33%) Tunneling: No Undermining: No Wound Description Classification: Grade 1 Wound Margin: Flat and Intact Exudate Amount: Medium Exudate Type: Serosanguineous Exudate Color: red, brown Foul Odor After Cleansing: No Slough/Fibrino Yes Wound Bed Granulation Amount: Large (67-100%) Exposed Structure Granulation Quality: Red Fascia Exposed:  No Necrotic Amount: Small (1-33%) Fat Layer (Subcutaneous Tissue) Exposed: Yes Necrotic Quality: Adherent Slough Tendon Exposed: No Muscle Exposed: No Joint Exposed: No Bone Exposed: No Treatment Notes Wound #1 (Foot) Wound Laterality: Dorsal, Right Cleanser Peri-Wound Care Sween Lotion (Moisturizing lotion) Discharge Instruction: Apply moisturizing lotion as directed Topical Primary Dressing Hydrofera Blue Classic Foam, 2x2 in Discharge Instruction: Moisten with saline prior to applying to wound bed Secondary Dressing Woven Gauze Sponge, Non-Sterile 4x4 in Discharge Instruction: Apply over primary dressing as directed. Secured With Compression Wrap ThreePress (3 layer compression wrap) Discharge Instruction: Apply three layer compression as directed. Compression Stockings Add-Ons Electronic Signature(s) Signed: 01/18/2021 5:29:08 PM By: Baruch Gouty RN, BSN Entered By: Baruch Gouty on 01/18/2021 13:49:35 -------------------------------------------------------------------------------- Vitals Details Patient Name: Date of Service: Joe Peter A. 01/18/2021 1:15 PM Medical Record Number: [id number] Patient Account Number: [id number] Date of Birth/Sex: Treating RN: 26-Aug-1980 (40 y.o. Male) Baruch Gouty Primary Care Noal Abshier: [place] Other Clinician: Referring Jenniefer Salak: Treating Agron Swiney/Extender: Zena Amos in Treatment: 0 Vital Signs Time Taken: 13:22 Temperature (F): 98.3 Height (in): 77 Pulse (bpm): 79 Source: Stated Respiratory Rate (breaths/min): 22 Weight (lbs): 485 Blood Pressure (mmHg): 122/85 Source: Stated Reference Range: 80 - 120 mg / dl Body Mass Index (BMI): 57.5 Electronic Signature(s) Signed: 01/18/2021 5:29:08 PM By: Baruch Gouty RN, BSN Entered By: Baruch Gouty on 01/18/2021 13:22:54

## 2021-01-25 ENCOUNTER — Encounter (HOSPITAL_BASED_OUTPATIENT_CLINIC_OR_DEPARTMENT_OTHER): Payer: Medicaid Other | Admitting: Internal Medicine

## 2021-01-30 ENCOUNTER — Other Ambulatory Visit: Payer: Self-pay | Admitting: Internal Medicine

## 2021-02-08 ENCOUNTER — Other Ambulatory Visit: Payer: Self-pay

## 2021-02-08 ENCOUNTER — Encounter (HOSPITAL_BASED_OUTPATIENT_CLINIC_OR_DEPARTMENT_OTHER): Payer: Medicaid Other | Attending: Internal Medicine | Admitting: Internal Medicine

## 2021-02-08 DIAGNOSIS — L97518 Non-pressure chronic ulcer of other part of right foot with other specified severity: Secondary | ICD-10-CM | POA: Insufficient documentation

## 2021-02-08 DIAGNOSIS — E11621 Type 2 diabetes mellitus with foot ulcer: Secondary | ICD-10-CM | POA: Insufficient documentation

## 2021-02-08 DIAGNOSIS — I5032 Chronic diastolic (congestive) heart failure: Secondary | ICD-10-CM | POA: Insufficient documentation

## 2021-02-08 DIAGNOSIS — I890 Lymphedema, not elsewhere classified: Secondary | ICD-10-CM | POA: Insufficient documentation

## 2021-02-08 DIAGNOSIS — E11622 Type 2 diabetes mellitus with other skin ulcer: Secondary | ICD-10-CM | POA: Insufficient documentation

## 2021-02-08 DIAGNOSIS — L97828 Non-pressure chronic ulcer of other part of left lower leg with other specified severity: Secondary | ICD-10-CM | POA: Insufficient documentation

## 2021-02-08 DIAGNOSIS — I110 Hypertensive heart disease with heart failure: Secondary | ICD-10-CM | POA: Insufficient documentation

## 2021-02-08 DIAGNOSIS — E1142 Type 2 diabetes mellitus with diabetic polyneuropathy: Secondary | ICD-10-CM | POA: Insufficient documentation

## 2021-02-08 NOTE — Progress Notes (Signed)
BERKELEY, VANAKEN ([id number]) Visit Report for 02/08/2021 Debridement Details Patient Name: Date of Service: Joe Carter, Joe Carter 02/08/2021 12:30 PM Medical Record Number: [id number] Patient Account Number: [id number] Date of Birth/Sex: Treating RN: 1980-10-30 (40 y.o. Hessie Diener Primary Care Provider: Harlow Ohms Other Clinician: Referring Provider: Treating Provider/Extender: Idamae Lusher, Areeg Weeks in Treatment: 3 Debridement Performed for Assessment: Wound #1 Right,Dorsal Foot Performed By: Physician Ricard Dillon., MD Debridement Type: Debridement Severity of Tissue Pre Debridement: Fat layer exposed Level of Consciousness (Pre-procedure): Awake and Alert Pre-procedure Verification/Time Out Yes - 13:05 Taken: Start Time: 13:06 Pain Control: Other : Benzocaine 20% T Area Debrided (L x W): otal 1 (cm) x 1.3 (cm) = 1.3 (cm) Tissue and other material debrided: Viable, Non-Viable, Slough, Subcutaneous, Skin: Dermis , Skin: Epidermis, Fibrin/Exudate, Slough Level: Skin/Subcutaneous Tissue Debridement Description: Excisional Instrument: Curette Bleeding: Minimum Hemostasis Achieved: Pressure End Time: 13:10 Procedural Pain: 0 Post Procedural Pain: 0 Response to Treatment: Procedure was tolerated well Level of Consciousness (Post- Awake and Alert procedure): Post Debridement Measurements of Total Wound Length: (cm) 1 Width: (cm) 1.3 Depth: (cm) 0.1 Volume: (cm) 0.102 Character of Wound/Ulcer Post Debridement: Improved Severity of Tissue Post Debridement: Fat layer exposed Post Procedure Diagnosis Same as Pre-procedure Electronic Signature(s) Signed: 02/08/2021 5:16:34 PM By: Linton Ham MD Signed: 02/08/2021 5:18:51 PM By: Deon Pilling RN, BSN Entered By: Linton Ham on 02/08/2021 13:23:26 -------------------------------------------------------------------------------- Debridement Details Patient Name: Date of Service: Joe Peter  A. 02/08/2021 12:30 PM Medical Record Number: [id number] Patient Account Number: [id number] Date of Birth/Sex: Treating RN: April 12, 1980 (40 y.o. Hessie Diener Primary Care Provider: Harlow Ohms Other Clinician: Referring Provider: Treating Provider/Extender: Idamae Lusher, Areeg Weeks in Treatment: 3 Debridement Performed for Assessment: Wound #2 Left,Anterior Lower Leg Performed By: Physician Ricard Dillon., MD Debridement Type: Debridement Severity of Tissue Pre Debridement: Fat layer exposed Level of Consciousness (Pre-procedure): Awake and Alert Pre-procedure Verification/Time Out Yes - 13:05 Taken: Start Time: 13:06 Pain Control: Other : Benzocaine 20% T Area Debrided (L x W): otal 0.8 (cm) x 2 (cm) = 1.6 (cm) Tissue and other material debrided: Viable, Non-Viable, Eschar, Subcutaneous, Skin: Dermis , Skin: Epidermis, Fibrin/Exudate Level: Skin/Subcutaneous Tissue Debridement Description: Excisional Instrument: Curette Bleeding: Minimum Hemostasis Achieved: Pressure End Time: 13:10 Procedural Pain: 0 Post Procedural Pain: 0 Response to Treatment: Procedure was tolerated well Level of Consciousness (Post- Awake and Alert procedure): Post Debridement Measurements of Total Wound Length: (cm) 0.8 Width: (cm) 2 Depth: (cm) 0.3 Volume: (cm) 0.377 Character of Wound/Ulcer Post Debridement: Improved Severity of Tissue Post Debridement: Fat layer exposed Post Procedure Diagnosis Same as Pre-procedure Electronic Signature(s) Signed: 02/08/2021 5:16:34 PM By: Linton Ham MD Signed: 02/08/2021 5:18:51 PM By: Deon Pilling RN, BSN Entered By: Linton Ham on 02/08/2021 13:23:36 -------------------------------------------------------------------------------- HPI Details Patient Name: Date of Service: Joe Peter A. 02/08/2021 12:30 PM Medical Record Number: [id number] Patient Account Number: [id number] Date of Birth/Sex: Treating RN: 1980/10/04  (40 y.o. Hessie Diener Primary Care Provider: Harlow Ohms Other Clinician: Referring Provider: Treating Provider/Extender: Idamae Lusher, Areeg Weeks in Treatment: 3 History of Present Illness HPI Description: Admission 10/27 This is a 40 year old man who has had an area on the top of his right foot for 2 months. He says this started as a blister which he unroofed. He was seen primary care at the [facility] internal medicine clinic on 11/29/2020 and given doxycycline. He also had a CRP, sed rate CBC and he was  given appointment to come here. Since then he has had problems with congestive heart failure requiring diuresis. The wound is not healed. He has been applying topical antibiotics. Past medical history includes congestive heart failure with preserved ejection fraction, obesity hypoventilation on CPAP morbid obesity and hypertension. The patient disputes that he is a diabetic I note that he is in list that is prediabetic in the last notes although his most recent hemoglobin A1c was 6.6 if confirmed that would establish a diagnosis of diabetes. He has neuropathy. ABI in our clinic was 1.23 on the right and 1.21 on the left 11/17; patient has not been here in about 3 weeks. Not sure what happened to the wrap on the right leg which was to control his swelling around the right dorsal foot wound. He came in here with a Band-Aid on this and the new wound on the left lower leg anteriorly just above the ankle. Not precisely sure when that happened. He did not have anything on this. 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 02/08/2021 12:30  PM Medical Record Number: [id number] Patient Account Number: [id number] Date of Birth/Sex: Treating RN: 1980-11-29 (40 y.o. Hessie Diener Primary Care Provider: Harlow Ohms Other Clinician: Referring Provider: Treating Provider/Extender: Idamae Lusher, Areeg Weeks in Treatment: 3 Constitutional Patient is hypertensive.. Pulse regular and within target range for patient.Marland Kitchen Respirations regular, non-labored and within target range.. Temperature is normal and within the target range for the patient.Marland Kitchen Appears in no distress. Respiratory work of breathing is normal. Cardiovascular Pedal pulses are palpable wound exam; right anterior foot wound about the same as last time completely necrotic poorly controlled swelling wound exam. Edema present in both extremities.. Notes wound exam; wound on the top of the right foot completely necrotic. He has a new wound on the left anterior lower tibia just above the ankle also completely necrotic material. Both of these debrided to reasonably healthy looking surfaces. He continues to have surrounding edema which I think is mostly lymphedema. He says he also has congestive heart failure on oxygen although the edema is nonpitting Electronic Signature(s) Signed: 02/08/2021 5:16:34 PM By: Linton Ham MD Entered By: Linton Ham on 02/08/2021 13:29:13 -------------------------------------------------------------------------------- Physician Orders Details Patient Name: Date of Service: Joe Peter A. 02/08/2021 12:30 PM Medical Record Number: [id number] Patient Account Number: [id number] Date of Birth/Sex: Treating RN: [birth year] (40 y.o. 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 - Dr. Heber [place] Monday or Tuesday Return A Bathing/ Shower/ Hygiene May shower with protection but do not get wound dressing(s) wet. Edema Control - Lymphedema / SCD / Other Elevate legs to the level of the heart or above for 30 minutes daily and/or when sitting, a frequency of: - throughout the day Avoid standing for long periods of time. Exercise regularly Wound Treatment Wound #1 - Foot Wound Laterality: Dorsal, Right Peri-Wound Care:  Sween Lotion (Moisturizing lotion) 1 x Per Week/15 Days Discharge Instructions: Apply moisturizing lotion as directed Prim Dressing: Hydrofera Blue Classic Foam, 2x2 in 1 x Per Week/15 Days ary Discharge Instructions: Moisten with saline prior to applying to wound bed Secondary Dressing: Woven Gauze Sponge, Non-Sterile 4x4 in 1 x Per Week/15 Days Discharge Instructions: Apply over primary dressing as directed. Compression Wrap: ThreePress (3 layer compression wrap) 1 x Per Week/15 Days Discharge Instructions: Apply three layer compression as directed. Wound #2 - Lower Leg Wound Laterality: Left, Anterior Peri-Wound Care: Sween Lotion (Moisturizing lotion) 1 x Per Week/15 Days Discharge Instructions: Apply moisturizing lotion as directed Prim Dressing: Hydrofera Blue Classic Foam, 2x2 in 1 x Per Week/15 Days ary Discharge Instructions: Moisten with saline prior to applying to wound bed Secondary Dressing: Woven Gauze Sponge, Non-Sterile 4x4 in 1 x Per Week/15 Days Discharge Instructions: Apply over primary dressing as directed. Compression Wrap: ThreePress (3 layer compression wrap) 1 x Per Week/15 Days Discharge Instructions: Apply three layer compression as directed. Patient Medications llergies: No Known Allergies A Notifications Medication  Indication Start End benzocaine DOSE topical 20 % aerosol - aerosol topical applied only in clinic. Electronic Signature(s) Signed: 02/08/2021 5:16:34 PM By: Linton Ham MD Signed: 02/08/2021 5:18:51 PM By: Deon Pilling RN, BSN Entered By: Deon Pilling on 02/08/2021 13:12:42 Prescription 02/08/2021 -------------------------------------------------------------------------------- Arnell Asal MD Patient Name: Provider: 1980/05/22 [id number] Date of Birth: NPI#: Jerilynn Mages [id number] Sex: DEA #: [phone] Phone #: License #: [place] Patient Address: [address] [place] [address] [place], [place] [address] [place], [place] [address] [phone] Allergies No Known Allergies Medication Medication: Route: Strength: Form: benzocaine topical 20 % aerosol Class: TOPICAL LOCAL ANESTHETICS Dose: Frequency / Time: Indication: aerosol topical applied only in clinic. Number of Refills: Number of Units: 0 Generic Substitution: Start Date: End Date: Administered at Facility: Substitution Permitted Yes Time Administered: Time Discontinued: Note to Pharmacy: Hand Signature: Date(s): Electronic Signature(s) Signed: 02/08/2021 5:16:34 PM By: Linton Ham MD Signed: 02/08/2021 5:18:51 PM By: Deon Pilling RN, BSN Entered By: Deon Pilling on 02/08/2021 13:12:42 -------------------------------------------------------------------------------- Problem List Details Patient Name: Date of Service: Joe Peter A. 02/08/2021 12:30 PM Medical Record Number: [id number] Patient Account Number: [id number] Date of Birth/Sex: Treating RN: May 04, 1980 (40 y.o. Lorette Ang, Meta.Reding Primary Care Provider: Harlow Ohms Other Clinician: Referring Provider: Treating Provider/Extender: Idamae Lusher, Areeg Weeks in Treatment: 3 Active Problems ICD-10 Encounter Code Description Active Date  MDM Diagnosis E11.621 Type 2 diabetes mellitus with foot ulcer 01/18/2021 No Yes L97.518 Non-pressure chronic ulcer of other part of right foot with other specified 01/18/2021 No Yes severity L97.828 Non-pressure chronic ulcer of other part of left lower leg with other specified 02/08/2021 No Yes severity E11.42 Type 2 diabetes mellitus with diabetic polyneuropathy 01/18/2021 No Yes I89.0 Lymphedema, not elsewhere classified 02/08/2021 No Yes Inactive Problems Resolved Problems Electronic Signature(s) Signed: 02/08/2021 5:16:34 PM By: Linton Ham MD Entered By: Linton Ham on 02/08/2021 13:24:09 -------------------------------------------------------------------------------- Progress Note Details Patient Name: Date of Service: Joe Peter A. 02/08/2021 12:30 PM Medical Record Number: [id number] Patient Account Number: [id number] Date of Birth/Sex: Treating RN: 1980/11/08 (40 y.o. Hessie Diener Primary Care Provider: Harlow Ohms Other Clinician: Referring Provider: Treating Provider/Extender: Idamae Lusher, Areeg Weeks in Treatment: 3 Subjective History of Present Illness (HPI) Admission 10/27 This is a 40 year old man who has had an area on the top of his right foot for  2 months. He says this started as a blister which he unroofed. He was seen primary care at the [facility] internal medicine clinic on 11/29/2020 and given doxycycline. He also had a CRP, sed rate CBC and he was given appointment to come here. Since then he has had problems with congestive heart failure requiring diuresis. The wound is not healed. He has been applying topical antibiotics. Past medical history includes congestive heart failure with preserved ejection fraction, obesity hypoventilation on CPAP morbid obesity and hypertension. The patient disputes that he is a diabetic I note that he is in list that is prediabetic in the last notes although his most recent hemoglobin A1c was 6.6 if  confirmed that would establish a diagnosis of diabetes. He has neuropathy. ABI in our clinic was 1.23 on the right and 1.21 on the left 11/17; patient has not been here in about 3 weeks. Not sure what happened to the wrap on the right leg which was to control his swelling around the right dorsal foot wound. He came in here with a Band-Aid on this and the new wound on the left lower leg anteriorly just above the ankle. Not precisely sure when that happened. He did not have anything on this. 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 Pulse regular and within target range for patient.Marland Kitchen Respirations regular, non-labored and within target range.. Temperature is normal and within the target range for the patient.Marland Kitchen Appears in no distress. Vitals Time Taken: 12:45 PM, Height: 77 in, Weight: 485 lbs, BMI: 57.5, Temperature: 97.7 F, Pulse: 88 bpm, Respiratory Rate: 20 breaths/min, Blood Pressure: 135/91 mmHg. Respiratory work of breathing is normal. Cardiovascular Pedal pulses are palpable wound exam; right anterior foot wound about the same as last time completely necrotic poorly controlled swelling wound exam. Edema present in both extremities.. General Notes: wound exam; wound on the top of the right foot completely necrotic. He has a new wound on the left anterior lower tibia just above the ankle also completely necrotic material. Both of these debrided to reasonably healthy looking surfaces. He continues to have surrounding edema which I think is mostly lymphedema. He says he also has congestive heart failure on oxygen although the edema is nonpitting Integumentary (Hair, Skin) Wound #1 status is Open. Original cause of wound was Blister. The date acquired was: 11/23/2020. The wound has been in treatment 3 weeks. The wound is located on the Right,Dorsal Foot. The wound measures 1cm  length x 1.3cm width x 0.1cm depth; 1.021cm^2 area and 0.102cm^3 volume. There is Fat Layer (Subcutaneous Tissue) exposed. There is no tunneling or undermining noted. There is a medium amount of purulent drainage noted. Foul odor after cleansing was noted. The wound margin is flat and intact. There is no granulation within the wound bed. There is a large (67-100%) amount of necrotic tissue within the wound bed including Adherent Slough. Wound #2 status is Open. Original cause of wound was Footwear Injury. The date acquired was: 02/01/2021. The wound is located on the Left,Anterior Lower Leg. The wound measures 0.8cm length x 2cm width x 0.3cm depth; 1.257cm^2 area and 0.377cm^3 volume. There is Fat Layer (Subcutaneous Tissue) exposed. There is no tunneling or undermining noted. There is a medium amount of serosanguineous drainage noted. The wound margin is distinct with the outline attached to the wound base. There is small (1-33%) red granulation within the wound  bed. There is a large (67-100%) amount of necrotic tissue within the wound bed including Eschar and Adherent Slough. 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 There was a Excisional Skin/Subcutaneous Tissue Debridement with a total area of 1.3 sq cm performed by Ricard Dillon., MD. With the following instrument(s): Curette to remove Viable and Non-Viable tissue/material. Material removed includes Subcutaneous Tissue, Slough, Skin: Dermis, Skin:  Epidermis, and Fibrin/Exudate after achieving pain control using Other (Benzocaine 20%). A time out was conducted at 13:05, prior to the start of the procedure. A Minimum amount of bleeding was controlled with Pressure. The procedure was tolerated well with a pain level of 0 throughout and a pain level of 0 following the procedure. Post Debridement Measurements: 1cm length x 1.3cm width x 0.1cm depth; 0.102cm^3 volume. Character of Wound/Ulcer Post Debridement is improved. Severity of Tissue Post Debridement is: Fat layer exposed. Post procedure Diagnosis Wound #1: Same as Pre-Procedure Pre-procedure diagnosis of Wound #1 is a Diabetic Wound/Ulcer of the Lower Extremity located on the Right,Dorsal Foot . There was a Three Layer Compression Therapy Procedure by Deon Pilling, RN. Post procedure Diagnosis Wound #1: Same as Pre-Procedure Wound #2 Pre-procedure diagnosis of Wound #2 is a Diabetic Wound/Ulcer of the Lower Extremity located on the Left,Anterior Lower Leg .Severity of Tissue Pre Debridement is: Fat layer exposed. There was a Excisional Skin/Subcutaneous Tissue Debridement with a total area of 1.6 sq cm performed by Ricard Dillon., MD. With the following instrument(s): Curette to remove Viable and Non-Viable tissue/material. Material removed includes Eschar, Subcutaneous Tissue, Skin: Dermis, Skin: Epidermis, and Fibrin/Exudate after achieving pain control using Other (Benzocaine 20%). A time out was conducted at 13:05, prior to the start of the procedure. A Minimum amount of bleeding was controlled with Pressure. The procedure was tolerated well with a pain level of 0 throughout and a pain level of 0 following the procedure. Post Debridement Measurements: 0.8cm length x 2cm width x 0.3cm depth; 0.377cm^3 volume. Character of Wound/Ulcer Post Debridement is improved. Severity of Tissue Post Debridement is: Fat layer exposed. Post procedure Diagnosis Wound #2: Same as  Pre-Procedure Pre-procedure diagnosis of Wound #2 is a Diabetic Wound/Ulcer of the Lower Extremity located on the Left,Anterior Lower Leg . There was a Three Layer Compression Therapy Procedure by Deon Pilling, RN. Post procedure Diagnosis Wound #2: Same as Pre-Procedure Plan Follow-up Appointments: Return Appointment in 1 week. - Dr. Heber [place] Monday or Tuesday Bathing/ Shower/ Hygiene: May shower with protection but do not get wound dressing(s) wet. Edema Control - Lymphedema / SCD / Other: Elevate legs to the level of the heart or above for 30 minutes daily and/or when sitting, a frequency of: - throughout the day Avoid standing for long periods of time. Exercise regularly The following medication(s) was prescribed: benzocaine topical 20 % aerosol aerosol topical applied only in clinic. was prescribed at facility WOUND #1: - Foot Wound Laterality: Dorsal, Right Peri-Wound Care: Sween Lotion (Moisturizing lotion) 1 x Per Week/15 Days Discharge Instructions: Apply moisturizing lotion as directed Prim Dressing: Hydrofera Blue Classic Foam, 2x2 in 1 x  Per Week/15 Days ary Discharge Instructions: Moisten with saline prior to applying to wound bed Secondary Dressing: Woven Gauze Sponge, Non-Sterile 4x4 in 1 x Per Week/15 Days Discharge Instructions: Apply over primary dressing as directed. Com pression Wrap: ThreePress (3 layer compression wrap) 1 x Per Week/15 Days Discharge Instructions: Apply three layer compression as directed. WOUND #2: - Lower Leg Wound Laterality: Left, Anterior Peri-Wound Care: Sween Lotion (Moisturizing lotion) 1 x Per Week/15 Days Discharge Instructions: Apply moisturizing lotion as directed Prim Dressing: Hydrofera Blue Classic Foam, 2x2 in 1 x Per Week/15 Days ary Discharge Instructions: Moisten with saline prior to applying to wound bed Secondary Dressing: Woven Gauze Sponge, Non-Sterile 4x4 in 1 x Per Week/15 Days Discharge Instructions: Apply over primary  dressing as directed. Com pression Wrap: ThreePress (3 layer compression wrap) 1 x Per Week/15 Days Discharge Instructions: Apply three layer compression as directed. 1. We applied Hydrofera Blue to both wound areas under 3 layer compression now bilaterally 2. I did not assess his systemic fluid volume status. This would not be easy because of his size. Nevertheless his edema was nonpitting. 3. I emphasized the need to come here weekly. Electronic Signature(s) Signed: 02/08/2021 5:16:34 PM By: Linton Ham MD Entered By: Linton Ham on 02/08/2021 13:30:13 -------------------------------------------------------------------------------- SuperBill Details Patient Name: Date of Service: Joe Peter A. 02/08/2021 Medical Record Number: [id number] Patient Account Number: [id number] Date of Birth/Sex: Treating RN: 11-19-1980 (40 y.o. 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 I89.0 Lymphedema, not elsewhere classified Facility Procedures CPT4 Code: [id number] Description: 92957 - DEB SUBQ TISSUE 20 SQ CM/< ICD-10 Diagnosis Description L97.518 Non-pressure chronic ulcer of other part of right foot with other specified seve L97.828 Non-pressure chronic ulcer of other part of left lower leg with other specified  I89.0 Lymphedema, not elsewhere classified Modifier: rity severity Quantity: 1 Physician Procedures : CPT4 Code Description Modifier [id number] - WC PHYS SUBQ TISS 20 SQ CM ICD-10 Diagnosis Description  L97.518 Non-pressure chronic ulcer of other part of right foot with other specified severity L97.828 Non-pressure chronic ulcer of other part of left  lower leg with other specified severity I89.0 Lymphedema, not elsewhere classified Quantity: 1 Electronic Signature(s) Signed: 02/08/2021 5:16:34 PM By: Linton Ham MD Entered By: Linton Ham on 02/08/2021 13:30:30

## 2021-02-08 NOTE — Progress Notes (Signed)
[name], Carter ([id number]) Visit Report for 02/08/2021 Arrival Information Details Patient Name: Date of Service: Joe Carter, Joe Carter 02/08/2021 12:30 PM Medical Record Number: [id number] Patient Account Number: [id number] Date of Birth/Sex: Treating RN: November 24, 1980 (40 y.o. Hessie Diener Primary Care Williamson Cavanah: Harlow Ohms Other Clinician: Referring Elaine Middleton: Treating Susana Gripp/Extender: Idamae Lusher, Areeg Weeks in Treatment: 3 Visit Information History Since Last Visit Added or deleted any medications: No Patient Arrived: Ambulatory Any new allergies or adverse reactions: No Arrival Time: 12:45 Had a fall or experienced change in No Accompanied By: self activities of daily living that may affect Transfer Assistance: None risk of falls: Patient Identification Verified: Yes Signs or symptoms of abuse/neglect since last visito No Secondary Verification Process Completed: Yes Hospitalized since last visit: No Patient Requires Transmission-Based Precautions: No Implantable device outside of the clinic excluding No Patient Has Alerts: No cellular tissue based products placed in the center since last visit: Has Dressing in Place as Prescribed: No Has Compression in Place as Prescribed: No Pain Present Now: Yes Electronic Signature(s) Signed: 02/08/2021 5:18:51 PM By: Deon Pilling RN, BSN Entered By: Deon Pilling on 02/08/2021 12:49:49 -------------------------------------------------------------------------------- Compression Therapy Details Patient Name: Date of Service: Joe Peter A. 02/08/2021 12:30 PM Medical Record Number: [id number] Patient Account Number: [id number] Date of Birth/Sex: Treating RN: [birth year] (40 y.o. Hessie Diener Primary Care Otho Michalik: Harlow Ohms Other Clinician: Referring Zetta Stoneman: Treating Hitomi Slape/Extender: Idamae Lusher, Areeg Weeks in Treatment: 3 Compression Therapy Performed for Wound Assessment: Wound #1  Right,Dorsal Foot Performed By: Clinician Deon Pilling, RN Compression Type: Three Layer Post Procedure Diagnosis Same as Pre-procedure Electronic Signature(s) Signed: 02/08/2021 5:18:51 PM By: Deon Pilling RN, BSN Entered By: Deon Pilling on 02/08/2021 13:11:26 -------------------------------------------------------------------------------- Compression Therapy Details Patient Name: Date of Service: Joe Peter A. 02/08/2021 12:30 PM Medical Record Number: [id number] Patient Account Number: [id number] Date of Birth/Sex: Treating RN: 10-17-1980 (40 y.o. Hessie Diener Primary Care Kira Hartl: Harlow Ohms Other Clinician: Referring Havah Ammon: Treating Samari Gorby/Extender: Idamae Lusher, Areeg Weeks in Treatment: 3 Compression Therapy Performed for Wound Assessment: Wound #2 Left,Anterior Lower Leg Performed By: Clinician Deon Pilling, RN Compression Type: Three Layer Post Procedure Diagnosis Same as Pre-procedure Electronic Signature(s) Signed: 02/08/2021 5:18:51 PM By: Deon Pilling RN, BSN Entered By: Deon Pilling on 02/08/2021 13:11:26 -------------------------------------------------------------------------------- Encounter Discharge Information Details Patient Name: Date of Service: Joe Peter A. 02/08/2021 12:30 PM Medical Record Number: [id number] Patient Account Number: [id number] Date of Birth/Sex: Treating RN: [birth year] (40 y.o. Hessie Diener Primary Care Keyontay Stolz: Harlow Ohms Other Clinician: Referring [name]: Treating Apphia Cropley/Extender: Idamae Lusher, Areeg Weeks in Treatment: 3 Encounter Discharge Information Items Post Procedure Vitals Discharge Condition: Stable Temperature (F): 97.7 Ambulatory Status: Ambulatory Pulse (bpm): 88 Discharge Destination: Home Respiratory Rate (breaths/min): 20 Transportation: Private Auto Blood Pressure (mmHg): 135/91 Accompanied By: self Schedule Follow-up Appointment: Yes Clinical  Summary of Care: Electronic Signature(s) Signed: 02/08/2021 5:18:51 PM By: Deon Pilling RN, BSN Entered By: Deon Pilling on 02/08/2021 13:15:08 -------------------------------------------------------------------------------- Lower Extremity Assessment Details Patient Name: Date of Service: Joe Peter A. 02/08/2021 12:30 PM Medical Record Number: [id number] Patient Account Number: [id number] Date of Birth/Sex: Treating RN: 10/20/1980 (40 y.o. Hessie Diener Primary Care Daegen Berrocal: Harlow Ohms Other Clinician: Referring Raeshawn Tafolla: Treating Elizabeht Suto/Extender: Idamae Lusher, Areeg Weeks in Treatment: 3 Edema Assessment Assessed: [Left: Yes] [Right: Yes] Edema: [Left: Yes] [Right: Yes] Calf Left: Right: Point of Measurement: From Medial Instep 53 cm 50.5 cm Ankle Left:  Right: Point of Measurement: From Medial Instep 35 cm 33 cm Vascular Assessment Pulses: Dorsalis Pedis Palpable: [Left:Yes] [Right:Yes] Electronic Signature(s) Signed: 02/08/2021 5:18:51 PM By: Deon Pilling RN, BSN Entered By: Deon Pilling on 02/08/2021 12:53:15 -------------------------------------------------------------------------------- Multi Wound Chart Details Patient Name: Date of Service: Joe Peter A. 02/08/2021 12:30 PM Medical Record Number: [id number] Patient Account Number: [id number] Date of Birth/Sex: Treating RN: Jul 17, 1980 (40 y.o. Hessie Diener Primary Care [name]: Harlow Ohms Other Clinician: Referring [name]: Treating Hallel Denherder/Extender: Idamae Lusher, Areeg Weeks in Treatment: 3 Vital Signs Height(in): 77 Pulse(bpm): 88 Weight(lbs): 485 Blood Pressure(mmHg): 135/91 Body Mass Index(BMI): 58 Temperature(F): 97.7 Respiratory Rate(breaths/min): 20 Photos: [N/A:N/A] Right, Dorsal Foot Left, Anterior Lower Leg N/A Wound Location: Blister Footwear Injury N/A Wounding Event: Diabetic Wound/Ulcer of the Lower Diabetic Wound/Ulcer of the Lower  N/A Primary Etiology: Extremity Extremity Sleep Apnea, Congestive Heart Sleep Apnea, Congestive Heart N/A Comorbid History: Failure, Hypertension, Type II Failure, Hypertension, Type II Diabetes, Neuropathy Diabetes, Neuropathy 11/23/2020 02/01/2021 N/A Date Acquired: 3 0 N/A Weeks of Treatment: Open Open N/A Wound Status: 1x1.3x0.1 0.8x2x0.3 N/A Measurements L x W x D (cm) 1.021 1.257 N/A A (cm) : rea 0.102 0.377 N/A Volume (cm) : 64.30% 0.00% N/A % Reduction in A rea: 64.30% 0.00% N/A % Reduction in Volume: Grade 2 Grade 2 N/A Classification: Medium Medium N/A Exudate A mount: Purulent Serosanguineous N/A Exudate Type: yellow, brown, green red, brown N/A Exudate Color: Yes No N/A Foul Odor A Cleansing: fter No N/A N/A Odor Anticipated Due to Product Use: Flat and Intact Distinct, outline attached N/A Wound Margin: None Present (0%) Small (1-33%) N/A Granulation A mount: N/A Red N/A Granulation Quality: Large (67-100%) Large (67-100%) N/A Necrotic Amount: Adherent Slough Eschar, Adherent Slough N/A Necrotic Tissue: Fat Layer (Subcutaneous Tissue): Yes Fat Layer (Subcutaneous Tissue): Yes N/A Exposed Structures: Fascia: No Fascia: No Tendon: No Tendon: No Muscle: No Muscle: No Joint: No Joint: No Bone: No Bone: No Small (1-33%) None N/A Epithelialization: Debridement - Excisional Debridement - Excisional N/A Debridement: Pre-procedure Verification/Time Out 13:05 13:05 N/A Taken: Other Other N/A Pain Control: Subcutaneous, Slough Necrotic/Eschar, Subcutaneous N/A Tissue Debrided: Skin/Subcutaneous Tissue Skin/Subcutaneous Tissue N/A Level: 1.3 1.6 N/A Debridement A (sq cm): rea Curette Curette N/A Instrument: Minimum Minimum N/A Bleeding: Pressure Pressure N/A Hemostasis A chieved: 0 0 N/A Procedural Pain: 0 0 N/A Post Procedural Pain: Procedure was tolerated well Procedure was tolerated well N/A Debridement Treatment  Response: 1x1.3x0.1 0.8x2x0.3 N/A Post Debridement Measurements L x W x D (cm) 0.102 0.377 N/A Post Debridement Volume: (cm) Compression Therapy Compression Therapy N/A Procedures Performed: Debridement Debridement Treatment Notes Wound #1 (Foot) Wound Laterality: Dorsal, Right Cleanser Peri-Wound Care Sween Lotion (Moisturizing lotion) Discharge Instruction: Apply moisturizing lotion as directed Topical Primary Dressing Hydrofera Blue Classic Foam, 2x2 in Discharge Instruction: Moisten with saline prior to applying to wound bed Secondary Dressing Woven Gauze Sponge, Non-Sterile 4x4 in Discharge Instruction: Apply over primary dressing as directed. Secured With Compression Wrap ThreePress (3 layer compression wrap) Discharge Instruction: Apply three layer compression as directed. Compression Stockings Add-Ons Wound #2 (Lower Leg) Wound Laterality: Left, Anterior Cleanser Peri-Wound Care Sween Lotion (Moisturizing lotion) Discharge Instruction: Apply moisturizing lotion as directed Topical Primary Dressing Hydrofera Blue Classic Foam, 2x2 in Discharge Instruction: Moisten with saline prior to applying to wound bed Secondary Dressing Woven Gauze Sponge, Non-Sterile 4x4 in Discharge Instruction: Apply over primary dressing as directed. Secured With Compression Wrap ThreePress (3 layer compression wrap) Discharge Instruction: Apply three layer  compression as directed. Compression Stockings Add-Ons Electronic Signature(s) Signed: 02/08/2021 5:16:34 PM By: Linton Ham MD Signed: 02/08/2021 5:18:51 PM By: Deon Pilling RN, BSN Entered By: Linton Ham on 02/08/2021 13:23:14 -------------------------------------------------------------------------------- Multi-Disciplinary Care Plan Details Patient Name: Date of Service: Joe Peter A. 02/08/2021 12:30 PM Medical Record Number: [id number] Patient Account Number: [id number] Date of Birth/Sex: Treating  RN: 24-Feb-1981 (40 y.o. Hessie Diener Primary Care Johany Hansman: Harlow Ohms Other Clinician: Referring Dyana Magner: Treating Chrisean Kloth/Extender: Idamae Lusher, Areeg Weeks in Treatment: 3 Multidisciplinary Care Plan reviewed with physician Active Inactive Nutrition Nursing Diagnoses: Impaired glucose control: actual or potential Potential for alteratiion in Nutrition/Potential for imbalanced nutrition Goals: Patient/caregiver will maintain therapeutic glucose control Date Initiated: 01/18/2021 Target Resolution Date: 03/23/2021 Goal Status: Active Interventions: Assess HgA1c results as ordered upon admission and as needed Assess patient nutrition upon admission and as needed per policy Provide education on elevated blood sugars and impact on wound healing Treatment Activities: Dietary management education, guidance and counseling : 01/18/2021 Notes: Wound/Skin Impairment Nursing Diagnoses: Impaired tissue integrity Knowledge deficit related to smoking impact on wound healing Knowledge deficit related to ulceration/compromised skin integrity Goals: Patient will demonstrate a reduced rate of smoking or cessation of smoking Date Initiated: 01/18/2021 Target Resolution Date: 03/23/2021 Goal Status: Active Patient/caregiver will verbalize understanding of skin care regimen Date Initiated: 01/18/2021 Target Resolution Date: 03/23/2021 Goal Status: Active Ulcer/skin breakdown will have a volume reduction of 30% by week 4 Date Initiated: 01/18/2021 Target Resolution Date: 02/15/2021 Goal Status: Active Interventions: Assess patient/caregiver ability to obtain necessary supplies Assess patient/caregiver ability to perform ulcer/skin care regimen upon admission and as needed Assess ulceration(s) every visit Provide education on ulcer and skin care Treatment Activities: Skin care regimen initiated : 01/18/2021 Topical wound management initiated :  01/18/2021 Notes: Electronic Signature(s) Signed: 02/08/2021 5:18:51 PM By: Deon Pilling RN, BSN Entered By: Deon Pilling on 02/08/2021 13:01:39 -------------------------------------------------------------------------------- Pain Assessment Details Patient Name: Date of Service: Joe Peter A. 02/08/2021 12:30 PM Medical Record Number: [id number] Patient Account Number: [id number] Date of Birth/Sex: Treating RN: 01/01/1981 (40 y.o. Hessie Diener Primary Care Raidon Swanner: Harlow Ohms Other Clinician: Referring Mafalda Mcginniss: Treating Brookelle Pellicane/Extender: Idamae Lusher, Areeg Weeks in Treatment: 3 Active Problems Location of Pain Severity and Description of Pain Patient Has Paino Yes Site Locations Pain Location: Generalized Pain Rate the pain. Current Pain Level: 7 Worst Pain Level: 10 Least Pain Level: 0 Tolerable Pain Level: 8 Pain Management and Medication Current Pain Management: Medication: No Cold Application: No Rest: No Massage: No Activity: No T.E.N.S.: No Heat Application: No Leg drop or elevation: No Is the Current Pain Management Adequate: Adequate How does your wound impact your activities of daily livingo Sleep: No Bathing: No Appetite: No Relationship With Others: No Bladder Continence: No Emotions: No Bowel Continence: No Work: No Toileting: No Drive: No Dressing: No Hobbies: No Engineer, maintenance) Signed: 02/08/2021 5:18:51 PM By: Deon Pilling RN, BSN Entered By: Deon Pilling on 02/08/2021 12:50:55 -------------------------------------------------------------------------------- Patient/Caregiver Education Details Patient Name: Date of Service: Len Blalock 11/17/2022andnbsp12:30 PM Medical Record Number: [id number] Patient Account Number: [id number] Date of Birth/Gender: Treating RN: 04-22-1980 (40 y.o. Hessie Diener Primary Care Physician: Harlow Ohms Other Clinician: Referring Physician: Treating  Physician/Extender: Idamae Lusher, Areeg Weeks in Treatment: 3 Education Assessment Education Provided To: Patient Education Topics Provided Elevated Blood Sugar/ Impact on Healing: Handouts: Elevated Blood Sugars: How Do They Affect Wound Healing Methods: Explain/Verbal Responses: Reinforcements needed Electronic Signature(s) Signed: 02/08/2021  5:18:51 PM By: Deon Pilling RN, BSN Entered By: Deon Pilling on 02/08/2021 13:01:51 -------------------------------------------------------------------------------- Wound Assessment Details Patient Name: Date of Service: PEARSON, PICOU A. 02/08/2021 12:30 PM Medical Record Number: [id number] Patient Account Number: [id number] Date of Birth/Sex: Treating RN: [birth year] (40 y.o. Hessie Diener Primary Care Kelton Bultman: Harlow Ohms Other Clinician: Referring Coila Wardell: Treating Necola Bluestein/Extender: Idamae Lusher, Areeg Weeks in Treatment: 3 Wound Status Wound Number: 1 Primary Diabetic Wound/Ulcer of the Lower Extremity Etiology: Wound Location: Right, Dorsal Foot Wound Open Wounding Event: Blister Status: Date Acquired: 11/23/2020 Comorbid Sleep Apnea, Congestive Heart Failure, Hypertension, Type II Weeks Of Treatment: 3 History: Diabetes, Neuropathy Clustered Wound: No Photos Wound Measurements Length: (cm) 1 Width: (cm) 1.3 Depth: (cm) 0.1 Area: (cm) 1.021 Volume: (cm) 0.102 % Reduction in Area: 64.3% % Reduction in Volume: 64.3% Epithelialization: Small (1-33%) Tunneling: No Undermining: No Wound Description Classification: Grade 2 Wound Margin: Flat and Intact Exudate Amount: Medium Exudate Type: Purulent Exudate Color: yellow, brown, green Foul Odor After Cleansing: Yes Due to Product Use: No Slough/Fibrino Yes Wound Bed Granulation Amount: None Present (0%) Exposed Structure Necrotic Amount: Large (67-100%) Fascia Exposed: No Necrotic Quality: Adherent Slough Fat Layer (Subcutaneous  Tissue) Exposed: Yes Tendon Exposed: No Muscle Exposed: No Joint Exposed: No Bone Exposed: No Treatment Notes Wound #1 (Foot) Wound Laterality: Dorsal, Right Cleanser Peri-Wound Care Sween Lotion (Moisturizing lotion) Discharge Instruction: Apply moisturizing lotion as directed Topical Primary Dressing Hydrofera Blue Classic Foam, 2x2 in Discharge Instruction: Moisten with saline prior to applying to wound bed Secondary Dressing Woven Gauze Sponge, Non-Sterile 4x4 in Discharge Instruction: Apply over primary dressing as directed. Secured With Compression Wrap ThreePress (3 layer compression wrap) Discharge Instruction: Apply three layer compression as directed. Compression Stockings Add-Ons Electronic Signature(s) Signed: 02/08/2021 5:03:29 PM By: Rhae Hammock RN Signed: 02/08/2021 5:18:51 PM By: Deon Pilling RN, BSN Entered By: Rhae Hammock on 02/08/2021 13:03:19 -------------------------------------------------------------------------------- Wound Assessment Details Patient Name: Date of Service: Joe Peter A. 02/08/2021 12:30 PM Medical Record Number: [id number] Patient Account Number: [id number] Date of Birth/Sex: Treating RN: 01-08-1981 (40 y.o. Hessie Diener Primary Care Kashaun Bebo: Harlow Ohms Other Clinician: Referring Anvitha Hutmacher: Treating Vonette Grosso/Extender: Idamae Lusher, Areeg Weeks in Treatment: 3 Wound Status Wound Number: 2 Primary Diabetic Wound/Ulcer of the Lower Extremity Etiology: Wound Location: Left, Anterior Lower Leg Wound Open Wounding Event: Footwear Injury Status: Date Acquired: 02/01/2021 Comorbid Sleep Apnea, Congestive Heart Failure, Hypertension, Type II Weeks Of Treatment: 0 History: Diabetes, Neuropathy Clustered Wound: No Photos Wound Measurements Length: (cm) 0.8 Width: (cm) 2 Depth: (cm) 0.3 Area: (cm) 1.257 Volume: (cm) 0.377 % Reduction in Area: 0% % Reduction in Volume: 0% Epithelialization:  None Tunneling: No Undermining: No Wound Description Classification: Grade 2 Wound Margin: Distinct, outline attached Exudate Amount: Medium Exudate Type: Serosanguineous Exudate Color: red, brown Foul Odor After Cleansing: No Slough/Fibrino Yes Wound Bed Granulation Amount: Small (1-33%) Exposed Structure Granulation Quality: Red Fascia Exposed: No Necrotic Amount: Large (67-100%) Fat Layer (Subcutaneous Tissue) Exposed: Yes Necrotic Quality: Eschar, Adherent Slough Tendon Exposed: No Muscle Exposed: No Joint Exposed: No Bone Exposed: No Treatment Notes Wound #2 (Lower Leg) Wound Laterality: Left, Anterior Cleanser Peri-Wound Care Sween Lotion (Moisturizing lotion) Discharge Instruction: Apply moisturizing lotion as directed Topical Primary Dressing Hydrofera Blue Classic Foam, 2x2 in Discharge Instruction: Moisten with saline prior to applying to wound bed Secondary Dressing Woven Gauze Sponge, Non-Sterile 4x4 in Discharge Instruction: Apply over primary dressing as directed. Secured With Compression Wrap ThreePress (3 layer  compression wrap) Discharge Instruction: Apply three layer compression as directed. Compression Stockings Add-Ons Electronic Signature(s) Signed: 02/08/2021 5:03:29 PM By: Rhae Hammock RN Signed: 02/08/2021 5:18:51 PM By: Deon Pilling RN, BSN Entered By: Rhae Hammock on 02/08/2021 13:02:41 -------------------------------------------------------------------------------- Vitals Details Patient Name: Date of Service: Joe Peter A. 02/08/2021 12:30 PM Medical Record Number: [id number] Patient Account Number: [id number] Date of Birth/Sex: Treating RN: 01/24/1981 (40 y.o. Hessie Diener Primary Care Calen Geister: Harlow Ohms Other Clinician: Referring Josuel Koeppen: Treating Ashya Nicolaisen/Extender: Idamae Lusher, Areeg Weeks in Treatment: 3 Vital Signs Time Taken: 12:45 Temperature (F): 97.7 Height (in): 77 Pulse (bpm):  88 Weight (lbs): 485 Respiratory Rate (breaths/min): 20 Body Mass Index (BMI): 57.5 Blood Pressure (mmHg): 135/91 Reference Range: 80 - 120 mg / dl Electronic Signature(s) Signed: 02/08/2021 5:18:51 PM By: Deon Pilling RN, BSN Entered By: Deon Pilling on 02/08/2021 12:50:08

## 2021-03-04 ENCOUNTER — Inpatient Hospital Stay (HOSPITAL_COMMUNITY): Payer: 59

## 2021-03-04 ENCOUNTER — Inpatient Hospital Stay (HOSPITAL_COMMUNITY)
Admission: EM | Admit: 2021-03-04 | Discharge: 2021-03-18 | DRG: 291 | Disposition: A | Discharge status: Home / Self Care | Payer: 59 | Attending: Internal Medicine | Admitting: Internal Medicine

## 2021-03-04 ENCOUNTER — Emergency Department (HOSPITAL_COMMUNITY): Payer: 59

## 2021-03-04 ENCOUNTER — Encounter (HOSPITAL_COMMUNITY): Payer: Self-pay

## 2021-03-04 ENCOUNTER — Other Ambulatory Visit: Payer: Self-pay

## 2021-03-04 DIAGNOSIS — J4551 Severe persistent asthma with (acute) exacerbation: Secondary | ICD-10-CM | POA: Diagnosis present

## 2021-03-04 DIAGNOSIS — I429 Cardiomyopathy, unspecified: Secondary | ICD-10-CM | POA: Diagnosis present

## 2021-03-04 DIAGNOSIS — J9601 Acute respiratory failure with hypoxia: Secondary | ICD-10-CM | POA: Diagnosis present

## 2021-03-04 DIAGNOSIS — R609 Edema, unspecified: Secondary | ICD-10-CM | POA: Diagnosis not present

## 2021-03-04 DIAGNOSIS — I509 Heart failure, unspecified: Secondary | ICD-10-CM

## 2021-03-04 DIAGNOSIS — N179 Acute kidney failure, unspecified: Secondary | ICD-10-CM | POA: Diagnosis not present

## 2021-03-04 DIAGNOSIS — J9621 Acute and chronic respiratory failure with hypoxia: Secondary | ICD-10-CM | POA: Diagnosis present

## 2021-03-04 DIAGNOSIS — E871 Hypo-osmolality and hyponatremia: Secondary | ICD-10-CM | POA: Diagnosis present

## 2021-03-04 DIAGNOSIS — Z7951 Long term (current) use of inhaled steroids: Secondary | ICD-10-CM

## 2021-03-04 DIAGNOSIS — I5043 Acute on chronic combined systolic (congestive) and diastolic (congestive) heart failure: Principal | ICD-10-CM | POA: Diagnosis present

## 2021-03-04 DIAGNOSIS — E662 Morbid (severe) obesity with alveolar hypoventilation: Secondary | ICD-10-CM | POA: Diagnosis present

## 2021-03-04 DIAGNOSIS — I471 Supraventricular tachycardia: Secondary | ICD-10-CM | POA: Diagnosis not present

## 2021-03-04 DIAGNOSIS — J441 Chronic obstructive pulmonary disease with (acute) exacerbation: Secondary | ICD-10-CM | POA: Diagnosis present

## 2021-03-04 DIAGNOSIS — J189 Pneumonia, unspecified organism: Secondary | ICD-10-CM | POA: Diagnosis not present

## 2021-03-04 DIAGNOSIS — J440 Chronic obstructive pulmonary disease with acute lower respiratory infection: Secondary | ICD-10-CM | POA: Diagnosis present

## 2021-03-04 DIAGNOSIS — J9622 Acute and chronic respiratory failure with hypercapnia: Secondary | ICD-10-CM | POA: Diagnosis present

## 2021-03-04 DIAGNOSIS — F17210 Nicotine dependence, cigarettes, uncomplicated: Secondary | ICD-10-CM | POA: Diagnosis present

## 2021-03-04 DIAGNOSIS — E1140 Type 2 diabetes mellitus with diabetic neuropathy, unspecified: Secondary | ICD-10-CM | POA: Diagnosis present

## 2021-03-04 DIAGNOSIS — Z91199 Patient's noncompliance with other medical treatment and regimen due to unspecified reason: Secondary | ICD-10-CM

## 2021-03-04 DIAGNOSIS — Z9289 Personal history of other medical treatment: Secondary | ICD-10-CM

## 2021-03-04 DIAGNOSIS — E785 Hyperlipidemia, unspecified: Secondary | ICD-10-CM | POA: Diagnosis present

## 2021-03-04 DIAGNOSIS — Z452 Encounter for adjustment and management of vascular access device: Secondary | ICD-10-CM | POA: Diagnosis not present

## 2021-03-04 DIAGNOSIS — Z6841 Body Mass Index (BMI) 40.0 and over, adult: Secondary | ICD-10-CM

## 2021-03-04 DIAGNOSIS — Z833 Family history of diabetes mellitus: Secondary | ICD-10-CM

## 2021-03-04 DIAGNOSIS — E873 Alkalosis: Secondary | ICD-10-CM | POA: Diagnosis present

## 2021-03-04 DIAGNOSIS — Z79899 Other long term (current) drug therapy: Secondary | ICD-10-CM

## 2021-03-04 DIAGNOSIS — Z9114 Patient's other noncompliance with medication regimen: Secondary | ICD-10-CM | POA: Diagnosis not present

## 2021-03-04 DIAGNOSIS — R0609 Other forms of dyspnea: Secondary | ICD-10-CM | POA: Diagnosis present

## 2021-03-04 DIAGNOSIS — Z20822 Contact with and (suspected) exposure to covid-19: Secondary | ICD-10-CM | POA: Diagnosis present

## 2021-03-04 DIAGNOSIS — Q2730 Arteriovenous malformation, site unspecified: Secondary | ICD-10-CM

## 2021-03-04 DIAGNOSIS — K5900 Constipation, unspecified: Secondary | ICD-10-CM | POA: Diagnosis present

## 2021-03-04 DIAGNOSIS — F1410 Cocaine abuse, uncomplicated: Secondary | ICD-10-CM | POA: Diagnosis present

## 2021-03-04 DIAGNOSIS — E119 Type 2 diabetes mellitus without complications: Secondary | ICD-10-CM | POA: Diagnosis not present

## 2021-03-04 DIAGNOSIS — E876 Hypokalemia: Secondary | ICD-10-CM | POA: Diagnosis present

## 2021-03-04 DIAGNOSIS — Z7984 Long term (current) use of oral hypoglycemic drugs: Secondary | ICD-10-CM

## 2021-03-04 DIAGNOSIS — G929 Unspecified toxic encephalopathy: Secondary | ICD-10-CM | POA: Diagnosis present

## 2021-03-04 DIAGNOSIS — I10 Essential (primary) hypertension: Secondary | ICD-10-CM

## 2021-03-04 DIAGNOSIS — G629 Polyneuropathy, unspecified: Secondary | ICD-10-CM

## 2021-03-04 DIAGNOSIS — I5030 Unspecified diastolic (congestive) heart failure: Secondary | ICD-10-CM | POA: Diagnosis present

## 2021-03-04 DIAGNOSIS — R001 Bradycardia, unspecified: Secondary | ICD-10-CM | POA: Diagnosis not present

## 2021-03-04 HISTORY — DX: Heart failure, unspecified: I50.9

## 2021-03-04 LAB — POCT I-STAT 7, (LYTES, BLD GAS, ICA,H+H)
Acid-Base Excess: 7 mmol/L — ABNORMAL HIGH (ref 0.0–2.0)
Acid-Base Excess: 7 mmol/L — ABNORMAL HIGH (ref 0.0–2.0)
Bicarbonate: 32 mmol/L — ABNORMAL HIGH (ref 20.0–28.0)
Bicarbonate: 34.4 mmol/L — ABNORMAL HIGH (ref 20.0–28.0)
Calcium, Ion: 1.06 mmol/L — ABNORMAL LOW (ref 1.15–1.40)
Calcium, Ion: 1.15 mmol/L (ref 1.15–1.40)
HCT: 49 % (ref 39.0–52.0)
HCT: 49 % (ref 39.0–52.0)
Hemoglobin: 16.7 g/dL (ref 13.0–17.0)
Hemoglobin: 16.7 g/dL (ref 13.0–17.0)
O2 Saturation: 73 %
O2 Saturation: 95 %
Patient temperature: 36.3
Potassium: 3.8 mmol/L (ref 3.5–5.1)
Potassium: 4.1 mmol/L (ref 3.5–5.1)
Sodium: 139 mmol/L (ref 135–145)
Sodium: 140 mmol/L (ref 135–145)
TCO2: 33 mmol/L — ABNORMAL HIGH (ref 22–32)
TCO2: 36 mmol/L — ABNORMAL HIGH (ref 22–32)
pCO2 arterial: 43.9 mmHg (ref 32.0–48.0)
pCO2 arterial: 59.6 mmHg — ABNORMAL HIGH (ref 32.0–48.0)
pH, Arterial: 7.369 (ref 7.350–7.450)
pH, Arterial: 7.468 — ABNORMAL HIGH (ref 7.350–7.450)
pO2, Arterial: 41 mmHg — ABNORMAL LOW (ref 83.0–108.0)
pO2, Arterial: 68 mmHg — ABNORMAL LOW (ref 83.0–108.0)

## 2021-03-04 LAB — URINALYSIS, ROUTINE W REFLEX MICROSCOPIC
Bacteria, UA: NONE SEEN
Bilirubin Urine: NEGATIVE
Glucose, UA: NEGATIVE mg/dL
Hgb urine dipstick: NEGATIVE
Ketones, ur: NEGATIVE mg/dL
Leukocytes,Ua: NEGATIVE
Nitrite: NEGATIVE
Protein, ur: 100 mg/dL — AB
Specific Gravity, Urine: 1.012 (ref 1.005–1.030)
pH: 5 (ref 5.0–8.0)

## 2021-03-04 LAB — GLUCOSE, CAPILLARY
Glucose-Capillary: 151 mg/dL — ABNORMAL HIGH (ref 70–99)
Glucose-Capillary: 123 mg/dL — ABNORMAL HIGH (ref 70–99)
Glucose-Capillary: 163 mg/dL — ABNORMAL HIGH (ref 70–99)
Glucose-Capillary: 151 mg/dL — ABNORMAL HIGH (ref 70–99)

## 2021-03-04 LAB — CBC WITH DIFFERENTIAL/PLATELET
Abs Immature Granulocytes: 0.03 10*3/uL (ref 0.00–0.07)
Basophils Absolute: 0.1 10*3/uL (ref 0.0–0.1)
Basophils Relative: 1 %
Eosinophils Absolute: 0.2 10*3/uL (ref 0.0–0.5)
Eosinophils Relative: 2 %
HCT: 49.5 % (ref 39.0–52.0)
Hemoglobin: 15.2 g/dL (ref 13.0–17.0)
Immature Granulocytes: 0 %
Lymphocytes Relative: 15 %
Lymphs Abs: 1.8 10*3/uL (ref 0.7–4.0)
MCH: 31.4 pg (ref 26.0–34.0)
MCHC: 30.7 g/dL (ref 30.0–36.0)
MCV: 102.3 fL — ABNORMAL HIGH (ref 80.0–100.0)
Monocytes Absolute: 0.9 10*3/uL (ref 0.1–1.0)
Monocytes Relative: 7 %
Neutro Abs: 9 10*3/uL — ABNORMAL HIGH (ref 1.7–7.7)
Neutrophils Relative %: 75 %
Platelets: 235 10*3/uL (ref 150–400)
RBC: 4.84 MIL/uL (ref 4.22–5.81)
RDW: 15.1 % (ref 11.5–15.5)
WBC: 12 10*3/uL — ABNORMAL HIGH (ref 4.0–10.5)
nRBC: 0 % (ref 0.0–0.2)

## 2021-03-04 LAB — I-STAT VENOUS BLOOD GAS, ED
Acid-Base Excess: 8 mmol/L — ABNORMAL HIGH (ref 0.0–2.0)
Acid-Base Excess: 4 mmol/L — ABNORMAL HIGH (ref 0.0–2.0)
Bicarbonate: 39 mmol/L — ABNORMAL HIGH (ref 20.0–28.0)
Bicarbonate: 35.9 mmol/L — ABNORMAL HIGH (ref 20.0–28.0)
Calcium, Ion: 1.15 mmol/L (ref 1.15–1.40)
Calcium, Ion: 1.11 mmol/L — ABNORMAL LOW (ref 1.15–1.40)
HCT: 50 % (ref 39.0–52.0)
HCT: 54 % — ABNORMAL HIGH (ref 39.0–52.0)
Hemoglobin: 17 g/dL (ref 13.0–17.0)
Hemoglobin: 18.4 g/dL — ABNORMAL HIGH (ref 13.0–17.0)
O2 Saturation: 91 %
O2 Saturation: 94 %
Potassium: 3.4 mmol/L — ABNORMAL LOW (ref 3.5–5.1)
Potassium: 3.8 mmol/L (ref 3.5–5.1)
Sodium: 142 mmol/L (ref 135–145)
Sodium: 141 mmol/L (ref 135–145)
TCO2: 42 mmol/L — ABNORMAL HIGH (ref 22–32)
TCO2: 38 mmol/L — ABNORMAL HIGH (ref 22–32)
pCO2, Ven: 84.4 mmHg (ref 44.0–60.0)
pCO2, Ven: 86.6 mmHg (ref 44.0–60.0)
pH, Ven: 7.273 (ref 7.250–7.430)
pH, Ven: 7.226 — ABNORMAL LOW (ref 7.250–7.430)
pO2, Ven: 72 mmHg — ABNORMAL HIGH (ref 32.0–45.0)
pO2, Ven: 89 mmHg — ABNORMAL HIGH (ref 32.0–45.0)

## 2021-03-04 LAB — COMPREHENSIVE METABOLIC PANEL
ALT: 12 U/L (ref 0–44)
AST: 15 U/L (ref 15–41)
Albumin: 3.4 g/dL — ABNORMAL LOW (ref 3.5–5.0)
Alkaline Phosphatase: 59 U/L (ref 38–126)
Anion gap: 5 (ref 5–15)
BUN: <5 mg/dL — ABNORMAL LOW (ref 6–20)
CO2: 37 mmol/L — ABNORMAL HIGH (ref 22–32)
Calcium: 8.5 mg/dL — ABNORMAL LOW (ref 8.9–10.3)
Chloride: 98 mmol/L (ref 98–111)
Creatinine, Ser: 0.78 mg/dL (ref 0.61–1.24)
GFR, Estimated: >60 mL/min (ref >60)
Glucose, Bld: 137 mg/dL — ABNORMAL HIGH (ref 70–99)
Potassium: 3.4 mmol/L — ABNORMAL LOW (ref 3.5–5.1)
Sodium: 140 mmol/L (ref 135–145)
Total Bilirubin: 0.7 mg/dL (ref 0.3–1.2)
Total Protein: 7.7 g/dL (ref 6.5–8.1)

## 2021-03-04 LAB — RESP PANEL BY RT-PCR (FLU A&B, COVID) ARPGX2
Influenza A by PCR: NEGATIVE
Influenza B by PCR: NEGATIVE
SARS Coronavirus 2 by RT PCR: NEGATIVE

## 2021-03-04 LAB — RAPID URINE DRUG SCREEN, HOSP PERFORMED
Amphetamines: NOT DETECTED
Barbiturates: NOT DETECTED
Benzodiazepines: NOT DETECTED
Cocaine: POSITIVE — AB
Opiates: NOT DETECTED
Tetrahydrocannabinol: NOT DETECTED

## 2021-03-04 LAB — MRSA NEXT GEN BY PCR, NASAL: MRSA by PCR Next Gen: NOT DETECTED

## 2021-03-04 LAB — HEMOGLOBIN A1C
Hgb A1c MFr Bld: 6.8 % — ABNORMAL HIGH (ref 4.8–5.6)
Mean Plasma Glucose: 148.46 mg/dL

## 2021-03-04 LAB — TROPONIN I (HIGH SENSITIVITY)
Troponin I (High Sensitivity): 13 ng/L (ref <18)
Troponin I (High Sensitivity): 11 ng/L (ref <18)

## 2021-03-04 LAB — ECHOCARDIOGRAM LIMITED BUBBLE STUDY
Height: 77 in
Weight: 7777.83 oz

## 2021-03-04 LAB — BRAIN NATRIURETIC PEPTIDE: B Natriuretic Peptide: 47.1 pg/mL (ref 0.0–100.0)

## 2021-03-04 MED ORDER — METOPROLOL TARTRATE 5 MG/5ML IV SOLN
5.0000 mg | Freq: Once | INTRAVENOUS | Status: AC
  Administered 2021-03-04: 5 mg via INTRAVENOUS
  Filled 2021-03-04: qty 5

## 2021-03-04 MED ORDER — CHLORHEXIDINE GLUCONATE 0.12% ORAL RINSE (MEDLINE KIT)
15.0000 mL | Freq: Two times a day (BID) | OROMUCOSAL | Status: DC
  Administered 2021-03-04 – 2021-03-08 (×8): 15 mL via OROMUCOSAL

## 2021-03-04 MED ORDER — PANTOPRAZOLE SODIUM 40 MG IV SOLR
40.0000 mg | Freq: Every day | INTRAVENOUS | Status: DC
  Administered 2021-03-04 – 2021-03-07 (×4): 40 mg via INTRAVENOUS
  Filled 2021-03-04 (×4): qty 40

## 2021-03-04 MED ORDER — ONDANSETRON HCL 4 MG/2ML IJ SOLN
4.0000 mg | Freq: Once | INTRAMUSCULAR | Status: AC
  Administered 2021-03-04: 4 mg via INTRAVENOUS
  Filled 2021-03-04: qty 2

## 2021-03-04 MED ORDER — ROCURONIUM BROMIDE 50 MG/5ML IV SOLN
INTRAVENOUS | Status: AC | PRN
  Administered 2021-03-04: 100 mg via INTRAVENOUS

## 2021-03-04 MED ORDER — HEPARIN BOLUS VIA INFUSION
5000.0000 [IU] | Freq: Once | INTRAVENOUS | Status: AC
  Administered 2021-03-04: 5000 [IU] via INTRAVENOUS
  Filled 2021-03-04: qty 5000

## 2021-03-04 MED ORDER — INSULIN ASPART 100 UNIT/ML IJ SOLN
0.0000 [IU] | INTRAMUSCULAR | Status: DC
  Administered 2021-03-04 (×2): 3 [IU] via SUBCUTANEOUS
  Administered 2021-03-05: 2 [IU] via SUBCUTANEOUS
  Administered 2021-03-05: 3 [IU] via SUBCUTANEOUS

## 2021-03-04 MED ORDER — NOREPINEPHRINE 4 MG/250ML-% IV SOLN
0.0000 ug/min | INTRAVENOUS | Status: DC
  Administered 2021-03-04: 10 ug/min via INTRAVENOUS
  Filled 2021-03-04: qty 250

## 2021-03-04 MED ORDER — ADENOSINE 6 MG/2ML IV SOLN
12.0000 mg | Freq: Once | INTRAVENOUS | Status: AC
  Administered 2021-03-04: 12 mg via INTRAVENOUS

## 2021-03-04 MED ORDER — DOCUSATE SODIUM 50 MG/5ML PO LIQD
100.0000 mg | Freq: Two times a day (BID) | ORAL | Status: DC
  Administered 2021-03-04 – 2021-03-06 (×5): 100 mg
  Filled 2021-03-04 (×5): qty 10

## 2021-03-04 MED ORDER — CHLORHEXIDINE GLUCONATE CLOTH 2 % EX PADS
6.0000 | MEDICATED_PAD | Freq: Every day | CUTANEOUS | Status: DC
  Administered 2021-03-04 – 2021-03-18 (×14): 6 via TOPICAL

## 2021-03-04 MED ORDER — MAGNESIUM SULFATE 2 GM/50ML IV SOLN
2.0000 g | Freq: Once | INTRAVENOUS | Status: AC
  Administered 2021-03-04: 2 g via INTRAVENOUS
  Filled 2021-03-04: qty 50

## 2021-03-04 MED ORDER — ARFORMOTEROL TARTRATE 15 MCG/2ML IN NEBU
15.0000 ug | INHALATION_SOLUTION | Freq: Two times a day (BID) | RESPIRATORY_TRACT | Status: DC
  Administered 2021-03-04 – 2021-03-18 (×26): 15 ug via RESPIRATORY_TRACT
  Filled 2021-03-04 (×29): qty 2

## 2021-03-04 MED ORDER — ADENOSINE 6 MG/2ML IV SOLN
6.0000 mg | Freq: Once | INTRAVENOUS | Status: AC
  Administered 2021-03-04: 6 mg via INTRAVENOUS

## 2021-03-04 MED ORDER — IPRATROPIUM BROMIDE 0.02 % IN SOLN
0.5000 mg | Freq: Once | RESPIRATORY_TRACT | Status: AC
  Administered 2021-03-04: 0.5 mg via RESPIRATORY_TRACT
  Filled 2021-03-04: qty 2.5

## 2021-03-04 MED ORDER — FUROSEMIDE 10 MG/ML IJ SOLN
60.0000 mg | Freq: Three times a day (TID) | INTRAMUSCULAR | Status: DC
  Administered 2021-03-04 – 2021-03-05 (×2): 60 mg via INTRAVENOUS
  Filled 2021-03-04 (×2): qty 6

## 2021-03-04 MED ORDER — POLYETHYLENE GLYCOL 3350 17 G PO PACK
17.0000 g | PACK | Freq: Every day | ORAL | Status: DC
  Administered 2021-03-05: 17 g
  Filled 2021-03-04: qty 1

## 2021-03-04 MED ORDER — FENTANYL BOLUS VIA INFUSION
50.0000 ug | INTRAVENOUS | Status: DC | PRN
  Filled 2021-03-04: qty 100

## 2021-03-04 MED ORDER — FENTANYL CITRATE PF 50 MCG/ML IJ SOSY: 50.0000 ug | PREFILLED_SYRINGE | Freq: Once | INTRAMUSCULAR | Status: DC

## 2021-03-04 MED ORDER — PERFLUTREN LIPID MICROSPHERE
1.0000 mL | INTRAVENOUS | Status: AC | PRN
  Administered 2021-03-04: 2 mL via INTRAVENOUS
  Filled 2021-03-04: qty 10

## 2021-03-04 MED ORDER — HEPARIN (PORCINE) 25000 UT/250ML-% IV SOLN
2800.0000 [IU]/h | INTRAVENOUS | Status: DC
  Administered 2021-03-04: 2300 [IU]/h via INTRAVENOUS
  Administered 2021-03-05: 2800 [IU]/h via INTRAVENOUS
  Administered 2021-03-05: 2300 [IU]/h via INTRAVENOUS
  Filled 2021-03-04 (×3): qty 250

## 2021-03-04 MED ORDER — POTASSIUM CHLORIDE 20 MEQ PO PACK
40.0000 meq | PACK | Freq: Once | ORAL | Status: AC
  Administered 2021-03-04: 40 meq
  Filled 2021-03-04: qty 2

## 2021-03-04 MED ORDER — ADENOSINE 6 MG/2ML IV SOLN
INTRAVENOUS | Status: AC
  Filled 2021-03-04: qty 2

## 2021-03-04 MED ORDER — NALOXONE HCL 0.4 MG/ML IJ SOLN: 0.4000 mg | INTRAMUSCULAR | Status: DC | PRN

## 2021-03-04 MED ORDER — METHYLPREDNISOLONE SODIUM SUCC 125 MG IJ SOLR
120.0000 mg | INTRAMUSCULAR | Status: DC
  Administered 2021-03-04 – 2021-03-06 (×3): 120 mg via INTRAVENOUS
  Filled 2021-03-04 (×3): qty 2

## 2021-03-04 MED ORDER — ORAL CARE MOUTH RINSE
15.0000 mL | OROMUCOSAL | Status: DC
  Administered 2021-03-04 – 2021-03-08 (×36): 15 mL via OROMUCOSAL

## 2021-03-04 MED ORDER — SODIUM CHLORIDE 0.9 % IV SOLN
2.0000 g | INTRAVENOUS | Status: AC
  Administered 2021-03-04 – 2021-03-08 (×5): 2 g via INTRAVENOUS
  Filled 2021-03-04 (×5): qty 20

## 2021-03-04 MED ORDER — FENTANYL 2500MCG IN NS 250ML (10MCG/ML) PREMIX INFUSION
50.0000 ug/h | INTRAVENOUS | Status: DC
  Administered 2021-03-04: 50 ug/h via INTRAVENOUS
  Administered 2021-03-05: 75 ug/h via INTRAVENOUS
  Administered 2021-03-05 – 2021-03-06 (×3): 200 ug/h via INTRAVENOUS
  Administered 2021-03-07: 08:00:00 175 ug/h via INTRAVENOUS
  Filled 2021-03-04 (×7): qty 250

## 2021-03-04 MED ORDER — ENOXAPARIN SODIUM 80 MG/0.8ML IJ SOSY
80.0000 mg | PREFILLED_SYRINGE | INTRAMUSCULAR | Status: DC
  Filled 2021-03-04: qty 0.8

## 2021-03-04 MED ORDER — BUDESONIDE 0.5 MG/2ML IN SUSP
0.5000 mg | Freq: Two times a day (BID) | RESPIRATORY_TRACT | Status: DC
  Administered 2021-03-04 – 2021-03-18 (×27): 0.5 mg via RESPIRATORY_TRACT
  Filled 2021-03-04 (×28): qty 2

## 2021-03-04 MED ORDER — PROPOFOL 1000 MG/100ML IV EMUL
5.0000 ug/kg/min | INTRAVENOUS | Status: DC
  Administered 2021-03-04 (×2): 70 ug/kg/min via INTRAVENOUS
  Administered 2021-03-04: 50 ug/kg/min via INTRAVENOUS
  Administered 2021-03-04: 60 ug/kg/min via INTRAVENOUS
  Administered 2021-03-04 (×2): 100 ug/kg/min via INTRAVENOUS
  Administered 2021-03-04: 55 ug/kg/min via INTRAVENOUS
  Administered 2021-03-04: 100 ug/kg/min via INTRAVENOUS
  Administered 2021-03-04: 5 ug/kg/min via INTRAVENOUS
  Administered 2021-03-05: 50 ug/kg/min via INTRAVENOUS
  Administered 2021-03-05: 35 ug/kg/min via INTRAVENOUS
  Administered 2021-03-05 (×2): 50 ug/kg/min via INTRAVENOUS
  Administered 2021-03-05 (×2): 40 ug/kg/min via INTRAVENOUS
  Administered 2021-03-05: 45 ug/kg/min via INTRAVENOUS
  Administered 2021-03-05 (×6): 50 ug/kg/min via INTRAVENOUS
  Administered 2021-03-05: 40 ug/kg/min via INTRAVENOUS
  Administered 2021-03-06: 50 ug/kg/min via INTRAVENOUS
  Administered 2021-03-06: 55 ug/kg/min via INTRAVENOUS
  Administered 2021-03-06: 50 ug/kg/min via INTRAVENOUS
  Administered 2021-03-06: 55 ug/kg/min via INTRAVENOUS
  Administered 2021-03-06: 50 ug/kg/min via INTRAVENOUS
  Administered 2021-03-06 (×3): 60 ug/kg/min via INTRAVENOUS
  Administered 2021-03-06: 50 ug/kg/min via INTRAVENOUS
  Administered 2021-03-06: 55 ug/kg/min via INTRAVENOUS
  Administered 2021-03-06 (×2): 60 ug/kg/min via INTRAVENOUS
  Administered 2021-03-06: 55 ug/kg/min via INTRAVENOUS
  Administered 2021-03-06: 60 ug/kg/min via INTRAVENOUS
  Administered 2021-03-06: 50 ug/kg/min via INTRAVENOUS
  Administered 2021-03-06: 60 ug/kg/min via INTRAVENOUS
  Administered 2021-03-07 (×6): 50 ug/kg/min via INTRAVENOUS
  Filled 2021-03-04 (×5): qty 100
  Filled 2021-03-04: qty 200
  Filled 2021-03-04 (×2): qty 100
  Filled 2021-03-04: qty 300
  Filled 2021-03-04 (×4): qty 100
  Filled 2021-03-04: qty 200
  Filled 2021-03-04 (×3): qty 100
  Filled 2021-03-04: qty 200
  Filled 2021-03-04: qty 100
  Filled 2021-03-04: qty 300
  Filled 2021-03-04 (×2): qty 200
  Filled 2021-03-04: qty 100
  Filled 2021-03-04: qty 200
  Filled 2021-03-04 (×2): qty 100
  Filled 2021-03-04: qty 200
  Filled 2021-03-04: qty 300
  Filled 2021-03-04: qty 200

## 2021-03-04 MED ORDER — LORAZEPAM 2 MG/ML IJ SOLN
1.0000 mg | Freq: Once | INTRAMUSCULAR | Status: AC
  Administered 2021-03-04: 1 mg via INTRAVENOUS
  Filled 2021-03-04: qty 1

## 2021-03-04 MED ORDER — ADENOSINE 6 MG/2ML IV SOLN
INTRAVENOUS | Status: AC
  Filled 2021-03-04: qty 4

## 2021-03-04 MED ORDER — ETOMIDATE 2 MG/ML IV SOLN
INTRAVENOUS | Status: AC | PRN
  Administered 2021-03-04: 20 mg via INTRAVENOUS

## 2021-03-04 MED ORDER — ONDANSETRON HCL 4 MG/2ML IJ SOLN
4.0000 mg | Freq: Four times a day (QID) | INTRAMUSCULAR | Status: DC | PRN
  Administered 2021-03-16 (×2): 4 mg via INTRAVENOUS
  Filled 2021-03-04 (×2): qty 2

## 2021-03-04 MED ORDER — ALBUTEROL SULFATE (2.5 MG/3ML) 0.083% IN NEBU
5.0000 mg | INHALATION_SOLUTION | Freq: Once | RESPIRATORY_TRACT | Status: AC
  Administered 2021-03-04: 5 mg via RESPIRATORY_TRACT
  Filled 2021-03-04: qty 6

## 2021-03-04 MED ORDER — FUROSEMIDE 10 MG/ML IJ SOLN
60.0000 mg | Freq: Once | INTRAMUSCULAR | Status: AC
  Administered 2021-03-04: 60 mg via INTRAVENOUS
  Filled 2021-03-04: qty 6

## 2021-03-04 NOTE — Procedures (Signed)
Central Venous Catheter Insertion Procedure Note  Joe Carter  [id number]  15-Oct-1980  Date:03/04/21  Time:4:27 PM   Provider Performing:Amberlynn Tempesta Cipriano Mile   Procedure: Insertion of Non-tunneled Central Venous Catheter(36556) with US guidance (72182)   Indication(s) Medication administration  Consent Unable to obtain consent due to emergent nature of procedure.  Anesthesia Topical only with 1% lidocaine   Timeout Verified patient identification, verified procedure, site/side was marked, verified correct patient position, special equipment/implants available, medications/allergies/relevant history reviewed, required imaging and test results available.  Sterile Technique Maximal sterile technique including full sterile barrier drape, hand hygiene, sterile gown, sterile gloves, mask, hair covering, sterile ultrasound probe cover (if used).  Procedure Description Area of catheter insertion was cleaned with chlorhexidine and draped in sterile fashion.  With real-time ultrasound guidance a central venous catheter was placed into the left internal jugular vein. Nonpulsatile blood flow and easy flushing noted in all ports.  The catheter was sutured in place and sterile dressing applied.  Complications/Tolerance None; patient tolerated the procedure well. Chest X-ray is ordered to verify placement for internal jugular or subclavian cannulation.   Chest x-ray is not ordered for femoral cannulation.  EBL Minimal  Specimen(s) None

## 2021-03-04 NOTE — Plan of Care (Signed)

## 2021-03-04 NOTE — ED Notes (Signed)
Spoke w/ admitting providers at bedside. Holding narcan at this time d/t not safe to administer while pt on bipap d/t aspiration risk and pt unable to tolerate being off of bipap
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
## 2021-03-04 NOTE — Progress Notes (Signed)
Adding heparin gtt for now given high benefit/risk ratio.  Went into SVT after line placement ablated with adenosine push x 2.  Bubble study and heart function looks okay to me.  Updated family again.  Additional 45 min cc time  Erskine Emery MD PCCM

## 2021-03-04 NOTE — Progress Notes (Signed)
Remains severely hypoxemic, would tolerate sats > 80%.  Suspect he has been severely hypoxemic at home for multiple days/weeks.  He did demonstrate some orthodeoxia so may need to eval for shunt.  Erskine Emery MD PCCM
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
## 2021-03-04 NOTE — ED Notes (Signed)
Pt agitated and crying. Pt focused on not being able to get into his phone. Pt is not redirectable. Plunkett MD at bedside. Pt changed to 6L [place] per MD. Pt unable to tolerate d/t agitation and crying. Pt placed back on BiPap. Pt still agitated and focused on his phone. Pt talking out of his head.

## 2021-03-04 NOTE — Progress Notes (Signed)
Pt was transported via ventilator with no apparent complications. Pt is now on 62M13C. 62M RT was given report and will continue to monitor.
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
## 2021-03-04 NOTE — Procedures (Signed)
Arterial Catheter Insertion Procedure Note  JEFFIE SPIVACK  [id number]  1981-01-21  Date:03/04/21  Time:4:43 PM    Provider Performing: Rossie Muskrat R    Procedure: Insertion of Arterial Line [phone]) without US guidance  Indication(s) Blood pressure monitoring and/or need for frequent ABGs  Consent Unable to obtain consent due to emergent nature of procedure.  Anesthesia None   Time Out Verified patient identification, verified procedure, site/side was marked, verified correct patient position, special equipment/implants available, medications/allergies/relevant history reviewed, required imaging and test results available.   Sterile Technique Maximal sterile technique including full sterile barrier drape, hand hygiene, sterile gown, sterile gloves, mask, hair covering, sterile ultrasound probe cover (if used).   Procedure Description Area of catheter insertion was cleaned with chlorhexidine and draped in sterile fashion. Without real-time ultrasound guidance an arterial catheter was placed into the right radial artery.  Appropriate arterial tracings confirmed on monitor.     Complications/Tolerance None; patient tolerated the procedure well.   EBL     Specimen(s) None

## 2021-03-04 NOTE — Procedures (Signed)
Intubation Procedure Note  Joe Carter  [id number]  [birth year]  Date:03/04/21  Time:3:44 PM   Provider Performing:Jomel Whittlesey Darrick Meigs    Procedure: Intubation (80881)  Indication(s) Respiratory Failure  Consent Unable to obtain consent due to emergent nature of procedure.   Anesthesia Etomidate and Rocuronium   Time Out Verified patient identification, verified procedure, site/side was marked, verified correct patient position, special equipment/implants available, medications/allergies/relevant history reviewed, required imaging and test results available.   Sterile Technique Usual hand hygeine, masks, and gloves were used   Procedure Description Patient positioned in bed supine.  Sedation given as noted above.  Patient was intubated with endotracheal tube using Glidescope.  View was Grade 1 full glottis .  Number of attempts was 1.  Colorimetric CO2 detector was consistent with tracheal placement.   Complications/Tolerance None; patient tolerated the procedure well. Chest X-ray is ordered to verify placement.   EBL Minimal   Specimen(s) None   Mitzi Hansen, MD Internal Medicine Resident PGY-3 [facility] Pager: [phone] 03/04/2021 3:45 PM

## 2021-03-04 NOTE — ED Notes (Signed)
Pt uncooperative w/ allowing staff to place condom cath. Pt redirected after multiple attempts to redirect pt and get pt to no longer interfere w/ his care. Condom cath placed.

## 2021-03-04 NOTE — ED Triage Notes (Signed)
Pt arrived to ED via EMS from home as a respiratory distress. Pt w/ pertinent hx of asthma and CHF. Pt is supposed to be on continuous O2 at home 1-3L. Pt wears 1L all the times and increases up to 3 prn. Due to insurance complications, pt has not had supplemental oxygen or medications x 1 month. Pt has been feeling shob x 5 days which has progressively gotten worse. Per EMS upon Fire arrival pt was 74& RA. Pt was placed on NRB at 15L and sats improved to 96%. EMS continued NRB at 15L and gave a duomenb (56m albuterol, 0.581matrovent) and 12522molumedrol.Pt lethargic upon arrival to ED.

## 2021-03-04 NOTE — Progress Notes (Addendum)
[place] Progress Note Patient Name: Joe Carter DOB: 1980-08-06 MRN: [id number]   Date of Service  03/04/2021  HPI/Events of Note  Requested foley for lasix in critically ill vented patient  eICU Interventions  Order foley  11:47 PM ABG reviewed. PO2 68. Increase PEEP to 14     Intervention Category Minor Interventions: Other:  Joe Carter 03/04/2021, 8:04 PM

## 2021-03-04 NOTE — ED Notes (Signed)
Pt now resting comfortably.

## 2021-03-04 NOTE — Procedures (Signed)
Central Venous Catheter Insertion Procedure Note  CASSON CATENA  [id number]  01-07-1981  Date:03/04/21  Time:3:43 PM   Provider Performing:Ilhan Debenedetto Darrick Meigs   Procedure: Insertion of Non-tunneled Central Venous Catheter(36556) with US guidance ([id number])   Indication(s) Medication administration  Consent Unable to obtain consent due to emergent nature of procedure.  Anesthesia Topical only with 1% lidocaine   Timeout Verified patient identification, verified procedure, site/side was marked, verified correct patient position, special equipment/implants available, medications/allergies/relevant history reviewed, required imaging and test results available.  Sterile Technique Maximal sterile technique including full sterile barrier drape, hand hygiene, sterile gown, sterile gloves, mask, hair covering, sterile ultrasound probe cover (if used).  Procedure Description Area of catheter insertion was cleaned with chlorhexidine and draped in sterile fashion.  With real-time ultrasound guidance a central venous catheter was placed into the left internal jugular vein. Nonpulsatile blood flow and easy flushing noted in all ports.  The catheter was sutured in place and sterile dressing applied.  Complications/Tolerance None; patient tolerated the procedure well. Chest X-ray is ordered to verify placement for internal jugular or subclavian cannulation.   Chest x-ray is not ordered for femoral cannulation.  EBL Minimal  Specimen(s) None  Mitzi Hansen, MD Internal Medicine Resident PGY-3 [facility] Pager: [phone] 03/04/2021 3:44 PM

## 2021-03-04 NOTE — Progress Notes (Signed)
RT x2 placed right radial A-line.

## 2021-03-05 ENCOUNTER — Inpatient Hospital Stay (HOSPITAL_COMMUNITY): Payer: 59

## 2021-03-05 DIAGNOSIS — R609 Edema, unspecified: Secondary | ICD-10-CM

## 2021-03-05 LAB — BASIC METABOLIC PANEL
Anion gap: 14 (ref 5–15)
Anion gap: 9 (ref 5–15)
Anion gap: 10 (ref 5–15)
BUN: 10 mg/dL (ref 6–20)
BUN: 13 mg/dL (ref 6–20)
BUN: 19 mg/dL (ref 6–20)
CO2: 28 mmol/L (ref 22–32)
CO2: 30 mmol/L (ref 22–32)
CO2: 29 mmol/L (ref 22–32)
Calcium: 8.7 mg/dL — ABNORMAL LOW (ref 8.9–10.3)
Calcium: 8.5 mg/dL — ABNORMAL LOW (ref 8.9–10.3)
Calcium: 8.5 mg/dL — ABNORMAL LOW (ref 8.9–10.3)
Chloride: 96 mmol/L — ABNORMAL LOW (ref 98–111)
Chloride: 98 mmol/L (ref 98–111)
Chloride: 99 mmol/L (ref 98–111)
Creatinine, Ser: 0.9 mg/dL (ref 0.61–1.24)
Creatinine, Ser: 0.96 mg/dL (ref 0.61–1.24)
Creatinine, Ser: 1.09 mg/dL (ref 0.61–1.24)
GFR, Estimated: >60 mL/min (ref >60)
GFR, Estimated: >60 mL/min (ref >60)
GFR, Estimated: >60 mL/min (ref >60)
Glucose, Bld: 152 mg/dL — ABNORMAL HIGH (ref 70–99)
Glucose, Bld: 127 mg/dL — ABNORMAL HIGH (ref 70–99)
Glucose, Bld: 178 mg/dL — ABNORMAL HIGH (ref 70–99)
Potassium: 3.8 mmol/L (ref 3.5–5.1)
Potassium: 3.7 mmol/L (ref 3.5–5.1)
Potassium: 4.3 mmol/L (ref 3.5–5.1)
Sodium: 138 mmol/L (ref 135–145)
Sodium: 137 mmol/L (ref 135–145)
Sodium: 138 mmol/L (ref 135–145)

## 2021-03-05 LAB — POCT I-STAT 7, (LYTES, BLD GAS, ICA,H+H)
Acid-Base Excess: 6 mmol/L — ABNORMAL HIGH (ref 0.0–2.0)
Bicarbonate: 32 mmol/L — ABNORMAL HIGH (ref 20.0–28.0)
Calcium, Ion: 1.14 mmol/L — ABNORMAL LOW (ref 1.15–1.40)
HCT: 49 % (ref 39.0–52.0)
Hemoglobin: 16.7 g/dL (ref 13.0–17.0)
O2 Saturation: 95 %
Patient temperature: 36.3
Potassium: 3.9 mmol/L (ref 3.5–5.1)
Sodium: 140 mmol/L (ref 135–145)
TCO2: 33 mmol/L — ABNORMAL HIGH (ref 22–32)
pCO2 arterial: 47.5 mmHg (ref 32.0–48.0)
pH, Arterial: 7.433 (ref 7.350–7.450)
pO2, Arterial: 72 mmHg — ABNORMAL LOW (ref 83.0–108.0)

## 2021-03-05 LAB — CBC
HCT: 48.3 % (ref 39.0–52.0)
Hemoglobin: 15.3 g/dL (ref 13.0–17.0)
MCH: 31.4 pg (ref 26.0–34.0)
MCHC: 31.7 g/dL (ref 30.0–36.0)
MCV: 99 fL (ref 80.0–100.0)
Platelets: 246 10*3/uL (ref 150–400)
RBC: 4.88 MIL/uL (ref 4.22–5.81)
RDW: 15 % (ref 11.5–15.5)
WBC: 12.1 10*3/uL — ABNORMAL HIGH (ref 4.0–10.5)
nRBC: 0 % (ref 0.0–0.2)

## 2021-03-05 LAB — GLUCOSE, CAPILLARY
Glucose-Capillary: 171 mg/dL — ABNORMAL HIGH (ref 70–99)
Glucose-Capillary: 143 mg/dL — ABNORMAL HIGH (ref 70–99)
Glucose-Capillary: 128 mg/dL — ABNORMAL HIGH (ref 70–99)
Glucose-Capillary: 123 mg/dL — ABNORMAL HIGH (ref 70–99)
Glucose-Capillary: 155 mg/dL — ABNORMAL HIGH (ref 70–99)
Glucose-Capillary: 177 mg/dL — ABNORMAL HIGH (ref 70–99)

## 2021-03-05 LAB — MAGNESIUM
Magnesium: 2.2 mg/dL (ref 1.7–2.4)
Magnesium: 2.2 mg/dL (ref 1.7–2.4)

## 2021-03-05 LAB — HEPARIN LEVEL (UNFRACTIONATED)
Heparin Unfractionated: 0.14 IU/mL — ABNORMAL LOW (ref 0.30–0.70)
Heparin Unfractionated: 0.35 IU/mL (ref 0.30–0.70)

## 2021-03-05 LAB — PROCALCITONIN: Procalcitonin: 39.33 ng/mL

## 2021-03-05 LAB — TRIGLYCERIDES: Triglycerides: 136 mg/dL (ref <150)

## 2021-03-05 LAB — PHOSPHORUS
Phosphorus: 3.1 mg/dL (ref 2.5–4.6)
Phosphorus: 4.5 mg/dL (ref 2.5–4.6)

## 2021-03-05 MED ORDER — HEPARIN BOLUS VIA INFUSION
4000.0000 [IU] | Freq: Once | INTRAVENOUS | Status: AC
  Administered 2021-03-05: 4000 [IU] via INTRAVENOUS
  Filled 2021-03-05: qty 4000

## 2021-03-05 MED ORDER — FUROSEMIDE 10 MG/ML IJ SOLN
16.0000 mg/h | INTRAVENOUS | Status: DC
  Administered 2021-03-05: 8 mg/h via INTRAVENOUS
  Administered 2021-03-06 – 2021-03-12 (×14): 16 mg/h via INTRAVENOUS
  Filled 2021-03-05 (×18): qty 20

## 2021-03-05 MED ORDER — INSULIN ASPART 100 UNIT/ML IJ SOLN
0.0000 [IU] | INTRAMUSCULAR | Status: DC
  Administered 2021-03-05 (×2): 4 [IU] via SUBCUTANEOUS
  Administered 2021-03-05 (×2): 3 [IU] via SUBCUTANEOUS
  Administered 2021-03-06 (×2): 4 [IU] via SUBCUTANEOUS
  Administered 2021-03-06 (×2): 3 [IU] via SUBCUTANEOUS
  Administered 2021-03-07: 20:00:00 4 [IU] via SUBCUTANEOUS
  Administered 2021-03-07: 08:00:00 3 [IU] via SUBCUTANEOUS
  Administered 2021-03-07 (×2): 4 [IU] via SUBCUTANEOUS
  Administered 2021-03-08: 7 [IU] via SUBCUTANEOUS
  Administered 2021-03-08: 4 [IU] via SUBCUTANEOUS
  Administered 2021-03-08: 3 [IU] via SUBCUTANEOUS
  Administered 2021-03-08 – 2021-03-09 (×2): 4 [IU] via SUBCUTANEOUS
  Administered 2021-03-09: 3 [IU] via SUBCUTANEOUS
  Administered 2021-03-09: 4 [IU] via SUBCUTANEOUS
  Administered 2021-03-09: 3 [IU] via SUBCUTANEOUS
  Administered 2021-03-09: 4 [IU] via SUBCUTANEOUS
  Administered 2021-03-09: 7 [IU] via SUBCUTANEOUS
  Administered 2021-03-10 (×3): 3 [IU] via SUBCUTANEOUS
  Administered 2021-03-10: 11 [IU] via SUBCUTANEOUS
  Administered 2021-03-10: 7 [IU] via SUBCUTANEOUS
  Administered 2021-03-10 – 2021-03-11 (×2): 4 [IU] via SUBCUTANEOUS
  Administered 2021-03-11: 20:00:00 15 [IU] via SUBCUTANEOUS
  Administered 2021-03-11: 12:00:00 3 [IU] via SUBCUTANEOUS
  Administered 2021-03-11: 16:00:00 7 [IU] via SUBCUTANEOUS
  Administered 2021-03-11: 08:00:00 11 [IU] via SUBCUTANEOUS
  Administered 2021-03-12 (×4): 4 [IU] via SUBCUTANEOUS
  Administered 2021-03-12: 20:00:00 7 [IU] via SUBCUTANEOUS
  Administered 2021-03-12: 08:00:00 4 [IU] via SUBCUTANEOUS
  Administered 2021-03-12: 16:00:00 7 [IU] via SUBCUTANEOUS
  Administered 2021-03-13: 05:00:00 3 [IU] via SUBCUTANEOUS
  Administered 2021-03-13 (×2): 7 [IU] via SUBCUTANEOUS
  Administered 2021-03-13: 07:00:00 4 [IU] via SUBCUTANEOUS

## 2021-03-05 MED ORDER — VITAL HIGH PROTEIN PO LIQD
1000.0000 mL | ORAL | Status: DC
  Administered 2021-03-05 – 2021-03-08 (×3): 1000 mL

## 2021-03-05 MED ORDER — PROSOURCE TF PO LIQD
90.0000 mL | Freq: Every day | ORAL | Status: DC
  Administered 2021-03-05 – 2021-03-07 (×8): 90 mL
  Filled 2021-03-05 (×11): qty 90

## 2021-03-05 MED ORDER — ENOXAPARIN SODIUM 120 MG/0.8ML IJ SOSY
110.0000 mg | PREFILLED_SYRINGE | INTRAMUSCULAR | Status: DC
  Administered 2021-03-05 – 2021-03-12 (×8): 110 mg via SUBCUTANEOUS
  Filled 2021-03-05 (×8): qty 0.74

## 2021-03-05 MED ORDER — POTASSIUM CHLORIDE 20 MEQ PO PACK
40.0000 meq | PACK | ORAL | Status: AC
  Administered 2021-03-05 (×2): 40 meq
  Filled 2021-03-05 (×2): qty 2

## 2021-03-05 NOTE — Progress Notes (Signed)
Lower extremity venous has been completed.   Preliminary results in CV Proc.   Joe Carter 03/05/2021 11:47 AM

## 2021-03-05 NOTE — Progress Notes (Signed)
Initial Nutrition Assessment  DOCUMENTATION CODES:   Morbid obesity  INTERVENTION:   Tube feeds via OG: Start Vital High Protein @ 40 mL/hr (960 mL/day) 90 mL ProSource x 5 per day Provides 1360 kcal (3108 kcal total), 194 gm PRO, 803 mL free water per day.  NUTRITION DIAGNOSIS:   Inadequate oral intake related to inability to eat as evidenced by NPO status.  GOAL:   Patient will meet greater than or equal to 90% of their needs  MONITOR:   Vent status, TF tolerance, Labs, I & O's  REASON FOR ASSESSMENT:   Ventilator    ASSESSMENT:   40 y.o. male presented with SOB. PMH includes CHF, DM, and HTN. Pt admitted with acute on chronic hypoxemic and hypercapnic respiratory failure.   12/11 - intubated  No family at bedside, unable to get nutrition related history.   Patient is currently intubated on ventilator support. MV: 13.4 L/min MAP: 79 Temp (24hrs), Avg:98 F (36.7 C), Min:97.4 F (36.3 C), Max:98.6 F (37 C) Continuous Medicines: Propofol: 66.2 ml/hr (1748 kcal/day) Fentanyl  Medications reviewed and include: Colace, Fentanyl, SSI 0-15 units q4h, Solu-Medrol, Protonix, Miralax, Potassium Chloride, IV antibiotics, IV Lasix Labs reviewed: Hgb A1c 6.8%, 24 hr BG trends 123-171  UOP: 2235 mL x 24 hr I/O's: -297 mL since admit  NUTRITION - FOCUSED PHYSICAL EXAM:  Flowsheet Row Most Recent Value  Orbital Region No depletion  Upper Arm Region No depletion  Thoracic and Lumbar Region No depletion  Buccal Region Unable to assess  Temple Region No depletion  Clavicle Bone Region No depletion  Clavicle and Acromion Bone Region No depletion  Scapular Bone Region No depletion  Dorsal Hand No depletion  Patellar Region No depletion  Anterior Thigh Region No depletion  Posterior Calf Region No depletion  Edema (RD Assessment) None  Hair Unable to assess  [Cap]  Eyes Unable to assess  Mouth Unable to assess  Skin Reviewed  Nails Reviewed       Diet  Order:   Diet Order             Diet NPO time specified  Diet effective now                   EDUCATION NEEDS:   Not appropriate for education at this time  Skin:  Skin Assessment: Reviewed RN Assessment  Last BM:  No Documentation  Height:   Ht Readings from Last 1 Encounters:  03/04/21 _0  (1.956 m)    Weight:   Wt Readings from Last 1 Encounters:  03/04/21 (!) 220.5 kg    Ideal Body Weight:  94.5 kg  BMI:  Body mass index is 57.64 kg/m.  Estimated Nutritional Needs:   Kcal:  2100-2300  Protein:  190-210 grams  Fluid:  > 2 L    Gardy Montanari Louie Casa, RD, LDN Clinical Dietitian See [facility] for contact information.

## 2021-03-05 NOTE — Progress Notes (Signed)
ANTICOAGULATION CONSULT NOTE - Follow Up Consult  Pharmacy Consult for heparin Indication:  r/o VTE  Labs: Recent Labs    03/04/21 0757 03/04/21 0834 03/04/21 0957 03/04/21 1047 03/04/21 2333 03/05/21 0136 03/05/21 0338 03/05/21 0350  HGB 15.2   < >  --    < > 16.7  --  15.3 16.7  HCT 49.5   < >  --    < > 49.0  --  48.3 49.0  PLT 235  --   --   --   --   --  246  --   HEPARINUNFRC  --   --   --   --   --  0.14*  --   --   CREATININE 0.78  --   --   --   --   --   --   --   TROPONINIHS 13  --  11  --   --   --   --   --    < > = values in this interval not displayed.    Assessment: 40yo male subtherapeutic on heparin with initial dosing for concern for VTE; no infusion issues or signs of bleeding per RN.  Goal of Therapy:  Heparin level 0.3-0.7 units/ml   Plan:  Will rebolus with heparin 4000 units and increase heparin infusion by 3 units/kgABW/hr to 2800 units/hr and check level in 6 hours.    Wynona Neat, PharmD, BCPS  03/05/2021,4:38 AM
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
## 2021-03-06 DIAGNOSIS — J9601 Acute respiratory failure with hypoxia: Secondary | ICD-10-CM | POA: Diagnosis not present

## 2021-03-06 LAB — BASIC METABOLIC PANEL
Anion gap: 11 (ref 5–15)
Anion gap: 13 (ref 5–15)
Anion gap: 10 (ref 5–15)
BUN: 21 mg/dL — ABNORMAL HIGH (ref 6–20)
BUN: 29 mg/dL — ABNORMAL HIGH (ref 6–20)
BUN: 34 mg/dL — ABNORMAL HIGH (ref 6–20)
CO2: 29 mmol/L (ref 22–32)
CO2: 28 mmol/L (ref 22–32)
CO2: 29 mmol/L (ref 22–32)
Calcium: 8.9 mg/dL (ref 8.9–10.3)
Calcium: 9 mg/dL (ref 8.9–10.3)
Calcium: 8.8 mg/dL — ABNORMAL LOW (ref 8.9–10.3)
Chloride: 97 mmol/L — ABNORMAL LOW (ref 98–111)
Chloride: 98 mmol/L (ref 98–111)
Chloride: 99 mmol/L (ref 98–111)
Creatinine, Ser: 1.03 mg/dL (ref 0.61–1.24)
Creatinine, Ser: 0.9 mg/dL (ref 0.61–1.24)
Creatinine, Ser: 1.04 mg/dL (ref 0.61–1.24)
GFR, Estimated: >60 mL/min (ref >60)
GFR, Estimated: >60 mL/min (ref >60)
GFR, Estimated: >60 mL/min (ref >60)
Glucose, Bld: 179 mg/dL — ABNORMAL HIGH (ref 70–99)
Glucose, Bld: 138 mg/dL — ABNORMAL HIGH (ref 70–99)
Glucose, Bld: 207 mg/dL — ABNORMAL HIGH (ref 70–99)
Potassium: 4.1 mmol/L (ref 3.5–5.1)
Potassium: 3.6 mmol/L (ref 3.5–5.1)
Potassium: 4 mmol/L (ref 3.5–5.1)
Sodium: 137 mmol/L (ref 135–145)
Sodium: 139 mmol/L (ref 135–145)
Sodium: 138 mmol/L (ref 135–145)

## 2021-03-06 LAB — GLUCOSE, CAPILLARY
Glucose-Capillary: 149 mg/dL — ABNORMAL HIGH (ref 70–99)
Glucose-Capillary: 92 mg/dL (ref 70–99)
Glucose-Capillary: 118 mg/dL — ABNORMAL HIGH (ref 70–99)
Glucose-Capillary: 144 mg/dL — ABNORMAL HIGH (ref 70–99)
Glucose-Capillary: 197 mg/dL — ABNORMAL HIGH (ref 70–99)
Glucose-Capillary: 191 mg/dL — ABNORMAL HIGH (ref 70–99)

## 2021-03-06 LAB — MAGNESIUM
Magnesium: 2.3 mg/dL (ref 1.7–2.4)
Magnesium: 2.3 mg/dL (ref 1.7–2.4)

## 2021-03-06 LAB — PHOSPHORUS
Phosphorus: 5.4 mg/dL — ABNORMAL HIGH (ref 2.5–4.6)
Phosphorus: 5.3 mg/dL — ABNORMAL HIGH (ref 2.5–4.6)

## 2021-03-06 LAB — CBC
HCT: 47.4 % (ref 39.0–52.0)
Hemoglobin: 15.2 g/dL (ref 13.0–17.0)
MCH: 31.8 pg (ref 26.0–34.0)
MCHC: 32.1 g/dL (ref 30.0–36.0)
MCV: 99.2 fL (ref 80.0–100.0)
Platelets: 260 10*3/uL (ref 150–400)
RBC: 4.78 MIL/uL (ref 4.22–5.81)
RDW: 15.7 % — ABNORMAL HIGH (ref 11.5–15.5)
WBC: 12.9 10*3/uL — ABNORMAL HIGH (ref 4.0–10.5)
nRBC: 0 % (ref 0.0–0.2)

## 2021-03-06 MED ORDER — ALBUTEROL SULFATE (2.5 MG/3ML) 0.083% IN NEBU
2.5000 mg | INHALATION_SOLUTION | RESPIRATORY_TRACT | Status: DC | PRN
  Administered 2021-03-06: 2.5 mg via RESPIRATORY_TRACT
  Filled 2021-03-06: qty 3

## 2021-03-06 MED ORDER — MIDAZOLAM HCL 2 MG/2ML IJ SOLN: 2.0000 mg | INTRAMUSCULAR | Status: DC | PRN

## 2021-03-06 MED ORDER — POLYETHYLENE GLYCOL 3350 17 G PO PACK
17.0000 g | PACK | Freq: Two times a day (BID) | ORAL | Status: DC
  Administered 2021-03-06 (×2): 17 g
  Filled 2021-03-06 (×2): qty 1

## 2021-03-06 MED ORDER — ATROPINE SULFATE 1 MG/10ML IJ SOSY
PREFILLED_SYRINGE | INTRAMUSCULAR | Status: AC
  Filled 2021-03-06: qty 10

## 2021-03-06 NOTE — Progress Notes (Signed)
eLink Physician-Brief Progress Note Patient Name: Joe Carter DOB: Jul 16, 1980 MRN: [id number]   Date of Service  03/06/2021  HPI/Events of Note  Patient with marked bradycardia into the 40's necessitating a dialing down of his sedation on the ventilator, which is presumed to be the cause of the bradycardia.  eICU Interventions  Bilateral soft wrist restraints ordered to prevent self extubation.        Kerry Kass Germaine Ripp 03/06/2021, 5:18 AM

## 2021-03-06 NOTE — Progress Notes (Signed)
eLink Physician-Brief Progress Note Patient Name: Joe Carter DOB: 06-Dec-1980 MRN: [id number]   Date of Service  03/06/2021  HPI/Events of Note  Patient with sub-optimal sedation and wheezing.  eICU Interventions  PRN orders entered for Albuterol and versed.        Japheth Diekman U Nichlos Kunzler 03/06/2021, 6:18 AM
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
## 2021-03-07 LAB — BASIC METABOLIC PANEL
Anion gap: 10 (ref 5–15)
Anion gap: 12 (ref 5–15)
Anion gap: 13 (ref 5–15)
BUN: 37 mg/dL — ABNORMAL HIGH (ref 6–20)
BUN: 44 mg/dL — ABNORMAL HIGH (ref 6–20)
BUN: 43 mg/dL — ABNORMAL HIGH (ref 6–20)
CO2: 31 mmol/L (ref 22–32)
CO2: 33 mmol/L — ABNORMAL HIGH (ref 22–32)
CO2: 36 mmol/L — ABNORMAL HIGH (ref 22–32)
Calcium: 8.9 mg/dL (ref 8.9–10.3)
Calcium: 9 mg/dL (ref 8.9–10.3)
Calcium: 9.3 mg/dL (ref 8.9–10.3)
Chloride: 98 mmol/L (ref 98–111)
Chloride: 97 mmol/L — ABNORMAL LOW (ref 98–111)
Chloride: 92 mmol/L — ABNORMAL LOW (ref 98–111)
Creatinine, Ser: 0.88 mg/dL (ref 0.61–1.24)
Creatinine, Ser: 0.91 mg/dL (ref 0.61–1.24)
Creatinine, Ser: 0.99 mg/dL (ref 0.61–1.24)
GFR, Estimated: >60 mL/min (ref >60)
GFR, Estimated: >60 mL/min (ref >60)
GFR, Estimated: >60 mL/min (ref >60)
Glucose, Bld: 153 mg/dL — ABNORMAL HIGH (ref 70–99)
Glucose, Bld: 108 mg/dL — ABNORMAL HIGH (ref 70–99)
Glucose, Bld: 130 mg/dL — ABNORMAL HIGH (ref 70–99)
Potassium: 3.9 mmol/L (ref 3.5–5.1)
Potassium: 3.5 mmol/L (ref 3.5–5.1)
Potassium: 3.7 mmol/L (ref 3.5–5.1)
Sodium: 139 mmol/L (ref 135–145)
Sodium: 142 mmol/L (ref 135–145)
Sodium: 141 mmol/L (ref 135–145)

## 2021-03-07 LAB — POCT I-STAT 7, (LYTES, BLD GAS, ICA,H+H)
Acid-Base Excess: 9 mmol/L — ABNORMAL HIGH (ref 0.0–2.0)
Bicarbonate: 35.5 mmol/L — ABNORMAL HIGH (ref 20.0–28.0)
Calcium, Ion: 1.17 mmol/L (ref 1.15–1.40)
HCT: 45 % (ref 39.0–52.0)
Hemoglobin: 15.3 g/dL (ref 13.0–17.0)
O2 Saturation: 92 %
Patient temperature: 98.2
Potassium: 3.9 mmol/L (ref 3.5–5.1)
Sodium: 140 mmol/L (ref 135–145)
TCO2: 37 mmol/L — ABNORMAL HIGH (ref 22–32)
pCO2 arterial: 54.4 mmHg — ABNORMAL HIGH (ref 32.0–48.0)
pH, Arterial: 7.421 (ref 7.350–7.450)
pO2, Arterial: 62 mmHg — ABNORMAL LOW (ref 83.0–108.0)

## 2021-03-07 LAB — GLUCOSE, CAPILLARY
Glucose-Capillary: 101 mg/dL — ABNORMAL HIGH (ref 70–99)
Glucose-Capillary: 126 mg/dL — ABNORMAL HIGH (ref 70–99)
Glucose-Capillary: 141 mg/dL — ABNORMAL HIGH (ref 70–99)
Glucose-Capillary: 159 mg/dL — ABNORMAL HIGH (ref 70–99)
Glucose-Capillary: 166 mg/dL — ABNORMAL HIGH (ref 70–99)
Glucose-Capillary: 185 mg/dL — ABNORMAL HIGH (ref 70–99)

## 2021-03-07 LAB — CBC
HCT: 45.5 % (ref 39.0–52.0)
Hemoglobin: 14.7 g/dL (ref 13.0–17.0)
MCH: 31.7 pg (ref 26.0–34.0)
MCHC: 32.3 g/dL (ref 30.0–36.0)
MCV: 98.3 fL (ref 80.0–100.0)
Platelets: 246 10*3/uL (ref 150–400)
RBC: 4.63 MIL/uL (ref 4.22–5.81)
RDW: 15.6 % — ABNORMAL HIGH (ref 11.5–15.5)
WBC: 12.2 10*3/uL — ABNORMAL HIGH (ref 4.0–10.5)
nRBC: 0 % (ref 0.0–0.2)

## 2021-03-07 LAB — PHOSPHORUS: Phosphorus: 5.5 mg/dL — ABNORMAL HIGH (ref 2.5–4.6)

## 2021-03-07 LAB — MAGNESIUM: Magnesium: 2.5 mg/dL — ABNORMAL HIGH (ref 1.7–2.4)

## 2021-03-07 MED ORDER — DOCUSATE SODIUM 100 MG PO CAPS
100.0000 mg | ORAL_CAPSULE | Freq: Two times a day (BID) | ORAL | Status: DC
  Administered 2021-03-11 – 2021-03-15 (×7): 100 mg via ORAL
  Filled 2021-03-07 (×12): qty 1

## 2021-03-07 MED ORDER — GUAIFENESIN 100 MG/5ML PO LIQD: 10.0000 mL | ORAL | Status: DC

## 2021-03-07 MED ORDER — FENTANYL CITRATE (PF) 100 MCG/2ML IJ SOLN
INTRAMUSCULAR | Status: AC
  Filled 2021-03-07: qty 2

## 2021-03-07 MED ORDER — SODIUM CHLORIDE 0.9% FLUSH
10.0000 mL | Freq: Two times a day (BID) | INTRAVENOUS | Status: DC
  Administered 2021-03-07: 11:00:00 20 mL
  Administered 2021-03-07: 22:00:00 10 mL
  Administered 2021-03-08: 20 mL
  Administered 2021-03-08 – 2021-03-09 (×2): 10 mL
  Administered 2021-03-09: 30 mL
  Administered 2021-03-10 – 2021-03-17 (×13): 10 mL
  Administered 2021-03-17: 22:00:00 30 mL
  Administered 2021-03-18: 09:00:00 10 mL

## 2021-03-07 MED ORDER — METHYLPREDNISOLONE SODIUM SUCC 125 MG IJ SOLR
80.0000 mg | INTRAMUSCULAR | Status: DC
Start: 2021-03-07 — End: 2021-03-08
  Administered 2021-03-07: 13:00:00 80 mg via INTRAVENOUS
  Filled 2021-03-07: qty 2

## 2021-03-07 MED ORDER — SENNA 8.6 MG PO TABS
2.0000 | ORAL_TABLET | Freq: Two times a day (BID) | ORAL | Status: DC
  Filled 2021-03-07: qty 2

## 2021-03-07 MED ORDER — METOLAZONE 2.5 MG PO TABS: 5.0000 mg | ORAL_TABLET | Freq: Once | ORAL | Status: DC

## 2021-03-07 MED ORDER — SODIUM CHLORIDE 0.9% FLUSH: 10.0000 mL | INTRAVENOUS | Status: DC | PRN

## 2021-03-07 MED ORDER — POLYETHYLENE GLYCOL 3350 17 G PO PACK: 17.0000 g | PACK | Freq: Two times a day (BID) | ORAL | Status: DC

## 2021-03-07 MED ORDER — GUAIFENESIN ER 600 MG PO TB12: 600.0000 mg | ORAL_TABLET | Freq: Two times a day (BID) | ORAL | Status: DC

## 2021-03-07 MED ORDER — SENNA 8.6 MG PO TABS: 2.0000 | ORAL_TABLET | Freq: Two times a day (BID) | ORAL | Status: DC

## 2021-03-07 MED ORDER — KETAMINE HCL 50 MG/5ML IJ SOSY
PREFILLED_SYRINGE | INTRAMUSCULAR | Status: AC
  Filled 2021-03-07: qty 5

## 2021-03-07 MED ORDER — ETOMIDATE 2 MG/ML IV SOLN
INTRAVENOUS | Status: AC
  Filled 2021-03-07: qty 20

## 2021-03-07 MED ORDER — GUAIFENESIN ER 600 MG PO TB12
600.0000 mg | ORAL_TABLET | Freq: Two times a day (BID) | ORAL | Status: DC
  Administered 2021-03-07 – 2021-03-18 (×22): 600 mg via ORAL
  Filled 2021-03-07 (×23): qty 1

## 2021-03-07 MED ORDER — INSULIN DETEMIR 100 UNIT/ML ~~LOC~~ SOLN
5.0000 [IU] | Freq: Every day | SUBCUTANEOUS | Status: DC
  Filled 2021-03-07: qty 0.05

## 2021-03-07 MED ORDER — ROCURONIUM BROMIDE 10 MG/ML (PF) SYRINGE
PREFILLED_SYRINGE | INTRAVENOUS | Status: AC
  Filled 2021-03-07: qty 10

## 2021-03-07 MED ORDER — METOLAZONE 2.5 MG PO TABS
5.0000 mg | ORAL_TABLET | Freq: Once | ORAL | Status: AC
  Administered 2021-03-07: 18:00:00 5 mg via ORAL
  Filled 2021-03-07: qty 2

## 2021-03-07 MED ORDER — SUCCINYLCHOLINE CHLORIDE 200 MG/10ML IV SOSY
PREFILLED_SYRINGE | INTRAVENOUS | Status: AC
  Filled 2021-03-07: qty 10

## 2021-03-07 MED ORDER — MIDAZOLAM HCL 2 MG/2ML IJ SOLN
INTRAMUSCULAR | Status: AC
  Filled 2021-03-07: qty 2
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
## 2021-03-07 NOTE — Procedures (Signed)
Extubation Procedure Note  Patient Details:   Name: Joe Carter DOB: [birth year] MRN: [id number]   Airway Documentation:    Vent end date: 03/07/21 Vent end time: 0910   Evaluation  O2 sats: stable throughout Complications: No apparent complications Patient did tolerate procedure well. Bilateral Breath Sounds: Rhonchi, Diminished   Yes  Patient extubated per order to bipap. Positive cuff leak was noted prior to extubation. Patient is alert and is able to follow commands. Vitals are stable. RT will continue to monitor.   Joe Carter 03/07/2021, 9:49 AM

## 2021-03-08 LAB — BASIC METABOLIC PANEL
Anion gap: 16 — ABNORMAL HIGH (ref 5–15)
Anion gap: 13 (ref 5–15)
BUN: 46 mg/dL — ABNORMAL HIGH (ref 6–20)
BUN: 50 mg/dL — ABNORMAL HIGH (ref 6–20)
CO2: 38 mmol/L — ABNORMAL HIGH (ref 22–32)
CO2: 38 mmol/L — ABNORMAL HIGH (ref 22–32)
Calcium: 9.8 mg/dL (ref 8.9–10.3)
Calcium: 9.6 mg/dL (ref 8.9–10.3)
Chloride: 91 mmol/L — ABNORMAL LOW (ref 98–111)
Chloride: 89 mmol/L — ABNORMAL LOW (ref 98–111)
Creatinine, Ser: 1.01 mg/dL (ref 0.61–1.24)
Creatinine, Ser: 1.06 mg/dL (ref 0.61–1.24)
GFR, Estimated: >60 mL/min (ref >60)
GFR, Estimated: >60 mL/min (ref >60)
Glucose, Bld: 101 mg/dL — ABNORMAL HIGH (ref 70–99)
Glucose, Bld: 170 mg/dL — ABNORMAL HIGH (ref 70–99)
Potassium: 3.4 mmol/L — ABNORMAL LOW (ref 3.5–5.1)
Potassium: 3.3 mmol/L — ABNORMAL LOW (ref 3.5–5.1)
Sodium: 145 mmol/L (ref 135–145)
Sodium: 140 mmol/L (ref 135–145)

## 2021-03-08 LAB — CBC
HCT: 52.4 % — ABNORMAL HIGH (ref 39.0–52.0)
Hemoglobin: 17.1 g/dL — ABNORMAL HIGH (ref 13.0–17.0)
MCH: 32 pg (ref 26.0–34.0)
MCHC: 32.6 g/dL (ref 30.0–36.0)
MCV: 98.1 fL (ref 80.0–100.0)
Platelets: 268 10*3/uL (ref 150–400)
RBC: 5.34 MIL/uL (ref 4.22–5.81)
RDW: 15.6 % — ABNORMAL HIGH (ref 11.5–15.5)
WBC: 16.6 10*3/uL — ABNORMAL HIGH (ref 4.0–10.5)
nRBC: 0 % (ref 0.0–0.2)

## 2021-03-08 LAB — GLUCOSE, CAPILLARY
Glucose-Capillary: 110 mg/dL — ABNORMAL HIGH (ref 70–99)
Glucose-Capillary: 117 mg/dL — ABNORMAL HIGH (ref 70–99)
Glucose-Capillary: 152 mg/dL — ABNORMAL HIGH (ref 70–99)
Glucose-Capillary: 160 mg/dL — ABNORMAL HIGH (ref 70–99)
Glucose-Capillary: 167 mg/dL — ABNORMAL HIGH (ref 70–99)
Glucose-Capillary: 212 mg/dL — ABNORMAL HIGH (ref 70–99)

## 2021-03-08 LAB — PHOSPHORUS: Phosphorus: 5.3 mg/dL — ABNORMAL HIGH (ref 2.5–4.6)

## 2021-03-08 LAB — MAGNESIUM: Magnesium: 2.5 mg/dL — ABNORMAL HIGH (ref 1.7–2.4)

## 2021-03-08 MED ORDER — CHLORHEXIDINE GLUCONATE 0.12 % MT SOLN
15.0000 mL | Freq: Two times a day (BID) | OROMUCOSAL | Status: DC
  Administered 2021-03-08 – 2021-03-11 (×6): 15 mL via OROMUCOSAL
  Filled 2021-03-08 (×6): qty 15

## 2021-03-08 MED ORDER — POTASSIUM CHLORIDE 10 MEQ/100ML IV SOLN
10.0000 meq | INTRAVENOUS | Status: AC
  Administered 2021-03-08 (×4): 10 meq via INTRAVENOUS
  Filled 2021-03-08 (×4): qty 100

## 2021-03-08 MED ORDER — ORAL CARE MOUTH RINSE
15.0000 mL | Freq: Two times a day (BID) | OROMUCOSAL | Status: DC
  Administered 2021-03-09 – 2021-03-10 (×4): 15 mL via OROMUCOSAL

## 2021-03-08 MED ORDER — METHYLPREDNISOLONE SODIUM SUCC 125 MG IJ SOLR
40.0000 mg | INTRAMUSCULAR | Status: DC
Start: 2021-03-08 — End: 2021-03-11
  Administered 2021-03-08 – 2021-03-10 (×3): 40 mg via INTRAVENOUS
  Filled 2021-03-08 (×3): qty 2

## 2021-03-08 NOTE — Evaluation (Signed)
Physical Therapy Evaluation Patient Details Name: Joe Carter MRN: [id number] DOB: 26-Sep-1980 Today's Date: 03/08/2021  History of Present Illness  40 yo admitted 12/11 with SOB and AMS with metabolic encephalopathy, cocaine (+), mixed respiratory failure with intubation 12/11-12/14. PMhx:obesity, DM, HFrEF, asthma  Clinical Impression  Pt very pleasant, normally independent and lives with mom, aunt and uncle and works at [employer]. Pt currently on [place] at 100% FiO2, 30 L with 15L partial rebreather throughout session with SpO2 80-92% with sats 82-87% consistently with mobility and HR up to 155 with standing, 127 at rest. Pt with decreased strength, activity tolerance, cardiopulmonary function who is currently a high fall risk and will benefit from acute therapy to maximize mobility, safety and strength to decrease burden of care. Encouraged bed level HEP, assisting with all mobility with nursing and requested bari recliner for room.       Recommendations for follow up therapy are one component of a multi-disciplinary discharge planning process, led by the attending physician.  Recommendations may be updated based on patient status, additional functional criteria and insurance authorization.  Follow Up Recommendations Acute inpatient rehab (3hours/day)    Assistance Recommended at Discharge Frequent or constant Supervision/Assistance  Functional Status Assessment Patient has had a recent decline in their functional status and demonstrates the ability to make significant improvements in function in a reasonable and predictable amount of time.  Equipment Recommendations  Rolling walker (2 wheels)    Recommendations for Other Services       Precautions / Restrictions Precautions Precautions: Fall;Other (comment) Precaution Comments: SpO2>80%, obesity Restrictions Weight Bearing Restrictions: No      Mobility  Bed Mobility Overal bed mobility: Needs Assistance Bed Mobility: Supine  to Sit;Sit to Supine     Supine to sit: Min assist Sit to supine: Min assist   General bed mobility comments: min assist with increased time to pivot toward left side of bed. Return to bed with assist to lift legs fully to surface. Max +2 with cues and pt assisting with head rail to slide toward [facility]    Transfers Overall transfer level: Needs assistance   Transfers: Sit to/from Stand Sit to Stand: Mod assist;+2 physical assistance           General transfer comment: mod +2 physical assist with use of pad at sacrum and bil knees blocked to rise from surface with assist to fully extend knees. Pt maintained standing grossly 30-60 sec each trial with assist for pericare in standing and pt able to take single side step toward HOB. HR up to 155 with standing and SpO2 drop to 80% but partial rebreather unable to be maintained in standing with only HHFNC    Ambulation/Gait               General Gait Details: not yet able  Stairs            Wheelchair Mobility    Modified Rankin (Stroke Patients Only)       Balance Overall balance assessment: Needs assistance   Sitting balance-Leahy Scale: Fair Sitting balance - Comments: EOb with guarding for lines with cues to shift weight anteriorly to keep feet on the floor     Standing balance-Leahy Scale: Poor Standing balance comment: knees blocked and bil UE supported                             Pertinent Vitals/Pain Pain Assessment: No/denies pain  Home Living Family/patient expects to be discharged to:: Private residence Living Arrangements: Parent;Other relatives Available Help at Discharge: Other (Comment);Family Type of Home: House Home Access: Stairs to enter Entrance Stairs-Rails: Right;Left;Can reach both Technical brewer of Steps: 3   Home Layout: One level Home Equipment: Electronics engineer Comments: works full-time at [employer], public transportation, does not drive.    Prior  Function Prior Level of Function : Independent/Modified Independent;Working/employed             Mobility Comments: pt denies h/o falls in the last 6 mos       Hand Dominance        Extremity/Trunk Assessment   Upper Extremity Assessment Upper Extremity Assessment: Defer to OT evaluation    Lower Extremity Assessment Lower Extremity Assessment: Generalized weakness    Cervical / Trunk Assessment Cervical / Trunk Assessment: Lordotic (increased body habitus)  Communication   Communication: No difficulties  Cognition Arousal/Alertness: Awake/alert Behavior During Therapy: WFL for tasks assessed/performed Overall Cognitive Status: Within Functional Limits for tasks assessed                                          General Comments      Exercises     Assessment/Plan    PT Assessment Patient needs continued PT services  PT Problem List Decreased strength;Decreased mobility;Decreased activity tolerance;Cardiopulmonary status limiting activity;Decreased skin integrity;Decreased balance;Decreased knowledge of use of DME;Obesity       PT Treatment Interventions DME instruction;Therapeutic exercise;Functional mobility training;Therapeutic activities;Patient/family education;Neuromuscular re-education;Gait training;Balance training    PT Goals (Current goals can be found in the Care Plan section)  Acute Rehab PT Goals Patient Stated Goal: 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[employer], Doctor, hospital football PT Goal Formulation: With patient Time For Goal Achievement: 03/22/21 Potential to Achieve Goals: Fair    Frequency Min 3X/week   Barriers to discharge Decreased caregiver support      Co-evaluation PT/OT/SLP Co-Evaluation/Treatment: Yes Reason for Co-Treatment: Complexity of the patient's impairments (multi-system involvement);For patient/therapist safety PT goals addressed during session: Mobility/safety with mobility;Strengthening/ROM         AM-PAC PT  "6 Clicks" Mobility  Outcome Measure Help needed turning from your back to your side while in a flat bed without using bedrails?: A Little Help needed moving from lying on your back to sitting on the side of a flat bed without using bedrails?: A Little Help needed moving to and from a bed to a chair (including a wheelchair)?: Total Help needed standing up from a chair using your arms (e.g., wheelchair or bedside chair)?: Total Help needed to walk in hospital room?: Total Help needed climbing 3-5 steps with a railing? : Total 6 Click Score: 10    End of Session Equipment Utilized During Treatment: Oxygen Activity Tolerance: Patient tolerated treatment well Patient left: in bed;with call bell/phone within reach Nurse Communication: Mobility status PT Visit Diagnosis: Other abnormalities of gait and mobility (R26.89);Difficulty in walking, not elsewhere classified (R26.2);Muscle weakness (generalized) (M62.81)    Time: 9935-7017 PT Time Calculation (min) (ACUTE ONLY): 27 min   Charges:   PT Evaluation $PT Eval Moderate Complexity: 1 Mod          [place], PT Acute Rehabilitation Services Pager: [phone] Office: [phone]   Berlene Dixson B Ninah Moccio 03/08/2021, 11:01 AM

## 2021-03-08 NOTE — Progress Notes (Signed)
Inpatient Rehab Admissions Coordinator:   Per therapy recommendations, pt was screened for CIR appropriateness by Shann Medal, PT, DPT.  At this time, pts supplemental oxygen needs are outside the parameters for CIR (will need to be on 8L or less via nasal cannula without desat during mobility).  We will follow from a distance.   Shann Medal, PT, DPT Admissions Coordinator [phone] 03/08/21  12:31 PM
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
## 2021-03-08 NOTE — Evaluation (Signed)
Occupational Therapy Evaluation Patient Details Name: Joe Carter MRN: [id number] DOB: 17-May-1980 Today's Date: 03/08/2021   History of Present Illness 40 yo admitted 12/11 with SOB and AMS with metabolic encephalopathy, cocaine (+), mixed respiratory failure with intubation 12/11-12/14. PMhx:obesity, DM, HFrEF, asthma   Clinical Impression   Pt admitted with the above diagnoses and presents with below problem list. Pt will benefit from continued acute OT to address the below listed deficits and maximize independence with basic ADLs prior to d/c to venue below. PTA pt was independent with ADLs, works full-time at [employer], utilizes public transportation. He lives with his mother, brother, aunt and uncle. Pt currently on [place] at 100% FiO2, 30 L with 15L partial rebreather throughout session with SpO2 80-92% with sats 82-87% consistently with mobility and HR up to 155 with standing, 127 at rest. Pt currently needs mod A with UB ADLs, max A +2 for LB ADLs. Able to stand 3x from EOB and sidestep once with mod physical assist +2.        Recommendations for follow up therapy are one component of a multi-disciplinary discharge planning process, led by the attending physician.  Recommendations may be updated based on patient status, additional functional criteria and insurance authorization.   Follow Up Recommendations  Acute inpatient rehab (3hours/day)    Assistance Recommended at Discharge Intermittent Supervision/Assistance  Functional Status Assessment  Patient has had a recent decline in their functional status and demonstrates the ability to make significant improvements in function in a reasonable and predictable amount of time.  Equipment Recommendations  Other (comment) (TBD)    Recommendations for Other Services       Precautions / Restrictions Precautions Precautions: Fall;Other (comment) Precaution Comments: SpO2>80%, obesity Restrictions Weight Bearing Restrictions: No       Mobility Bed Mobility Overal bed mobility: Needs Assistance Bed Mobility: Supine to Sit;Sit to Supine     Supine to sit: Min assist Sit to supine: Min assist   General bed mobility comments: min assist with increased time to pivot toward left side of bed. Return to bed with assist to lift legs fully to surface. Max +2 with cues and pt assisting with head rail to slide toward [facility]    Transfers Overall transfer level: Needs assistance Equipment used: 2 person hand held assist Transfers: Sit to/from Stand Sit to Stand: Mod assist;+2 physical assistance           General transfer comment: mod +2 physical assist with use of pad at sacrum and bil knees blocked to rise from surface with assist to fully extend knees. Pt maintained standing grossly 30-60 sec each trial with assist for pericare in standing and pt able to take single side step toward HOB. HR up to 155 with standing and SpO2 drop to 80% but partial rebreather unable to be maintained in standing with only HHFNC      Balance Overall balance assessment: Needs assistance   Sitting balance-Leahy Scale: Fair Sitting balance - Comments: EOB with guarding for lines with cues to shift weight anteriorly to keep feet on the floor     Standing balance-Leahy Scale: Poor Standing balance comment: knees blocked and bil UE supported                           ADL either performed or assessed with clinical judgement   ADL Overall ADL's : Needs assistance/impaired Eating/Feeding: Set up;Bed level;Sitting   Grooming: Moderate assistance;Sitting   Upper Body  Bathing: Moderate assistance;Sitting   Lower Body Bathing: Maximal assistance;Sit to/from stand;+2 for physical assistance;+2 for safety/equipment   Upper Body Dressing : Moderate assistance;Sitting   Lower Body Dressing: Maximal assistance;+2 for physical assistance;+2 for safety/equipment;Sit to/from stand                 General ADL Comments: supported  sitting for UB ADLs. single to BUE support needed for sitting  EOB. total A for Pericare in standing with mod A +2 for standing.     Vision         Perception     Praxis      Pertinent Vitals/Pain Pain Assessment: No/denies pain     Hand Dominance     Extremity/Trunk Assessment Upper Extremity Assessment Upper Extremity Assessment: Generalized weakness   Lower Extremity Assessment Lower Extremity Assessment: Defer to PT evaluation   Cervical / Trunk Assessment Cervical / Trunk Assessment: Other exceptions (increased body habitus)   Communication Communication Communication: No difficulties   Cognition Arousal/Alertness: Awake/alert Behavior During Therapy: WFL for tasks assessed/performed Overall Cognitive Status: Within Functional Limits for tasks assessed                                       General Comments       Exercises     Shoulder Instructions      Home Living Family/patient expects to be discharged to:: Private residence Living Arrangements: Parent;Other relatives (mother is disabled, brother works) Available Help at Discharge: Other (Comment);Family (mom is disabled. brother works. pt also mentioned aunt & uncle.) Type of Home: House Home Access: Stairs to enter Technical brewer of Steps: 3 Entrance Stairs-Rails: Right;Left;Can reach both Home Layout: One level     Bathroom Shower/Tub: Walk-in shower         Home Equipment: Building services engineer Comments: works full-time at [employer], public transportation, does not drive.      Prior Functioning/Environment Prior Level of Function : Independent/Modified Independent;Working/employed             Mobility Comments: pt denies h/o falls in the last 6 mos          OT Problem List: Decreased strength;Decreased activity tolerance;Impaired balance (sitting and/or standing);Decreased knowledge of use of DME or AE;Decreased knowledge of precautions;Cardiopulmonary  status limiting activity;Obesity      OT Treatment/Interventions: Self-care/ADL training;Therapeutic exercise;Energy conservation;DME and/or AE instruction;Therapeutic activities;Patient/family education;Balance training    OT Goals(Current goals can be found in the care plan section) Acute Rehab OT Goals Patient Stated Goal: regain independence OT Goal Formulation: With patient Time For Goal Achievement: 03/22/21 Potential to Achieve Goals: Good ADL Goals Pt Will Perform Grooming: with set-up;sitting Pt Will Perform Upper Body Bathing: with set-up;sitting Pt Will Perform Lower Body Bathing: with mod assist;sit to/from stand Pt Will Transfer to Toilet: with mod assist;ambulating Pt Will Perform Toileting - Clothing Manipulation and hygiene: with mod assist;sit to/from stand  OT Frequency: Min 2X/week   Barriers to D/C:            Co-evaluation PT/OT/SLP Co-Evaluation/Treatment: Yes Reason for Co-Treatment: Complexity of the patient's impairments (multi-system involvement);For patient/therapist safety PT goals addressed during session: Mobility/safety with mobility;Strengthening/ROM OT goals addressed during session: ADL's and self-care      AM-PAC OT "6 Clicks" Daily Activity     Outcome Measure Help from another person eating meals?: A Little Help from another person taking care of  personal grooming?: A Little Help from another person toileting, which includes using toliet, bedpan, or urinal?: Total Help from another person bathing (including washing, rinsing, drying)?: Total Help from another person to put on and taking off regular upper body clothing?: A Lot Help from another person to put on and taking off regular lower body clothing?: Total 6 Click Score: 11   End of Session Equipment Utilized During Treatment: Oxygen Nurse Communication: Other (comment);Mobility status (nurse present for most of session)  Activity Tolerance: Patient limited by fatigue;Patient  tolerated treatment well Patient left: in bed;with call bell/phone within reach  OT Visit Diagnosis: Unsteadiness on feet (R26.81);Muscle weakness (generalized) (M62.81)                Time: 4099-2780 OT Time Calculation (min): 29 min Charges:  OT General Charges $OT Visit: 1 Visit OT Evaluation $OT Eval Moderate Complexity: [place], OT Acute Rehabilitation Services Pager: [phone] Office: [phone]   [name] 03/08/2021, 12:22 PM

## 2021-03-09 LAB — CBC
HCT: 54.6 % — ABNORMAL HIGH (ref 39.0–52.0)
Hemoglobin: 17.5 g/dL — ABNORMAL HIGH (ref 13.0–17.0)
MCH: 30.8 pg (ref 26.0–34.0)
MCHC: 32.1 g/dL (ref 30.0–36.0)
MCV: 96 fL (ref 80.0–100.0)
Platelets: 269 10*3/uL (ref 150–400)
RBC: 5.69 MIL/uL (ref 4.22–5.81)
RDW: 15.2 % (ref 11.5–15.5)
WBC: 17.4 10*3/uL — ABNORMAL HIGH (ref 4.0–10.5)
nRBC: 0 % (ref 0.0–0.2)

## 2021-03-09 LAB — BASIC METABOLIC PANEL
Anion gap: 14 (ref 5–15)
Anion gap: 14 (ref 5–15)
BUN: 59 mg/dL — ABNORMAL HIGH (ref 6–20)
BUN: 60 mg/dL — ABNORMAL HIGH (ref 6–20)
CO2: 35 mmol/L — ABNORMAL HIGH (ref 22–32)
CO2: 37 mmol/L — ABNORMAL HIGH (ref 22–32)
Calcium: 9.8 mg/dL (ref 8.9–10.3)
Calcium: 10.2 mg/dL (ref 8.9–10.3)
Chloride: 87 mmol/L — ABNORMAL LOW (ref 98–111)
Chloride: 86 mmol/L — ABNORMAL LOW (ref 98–111)
Creatinine, Ser: 1.03 mg/dL (ref 0.61–1.24)
Creatinine, Ser: 1.1 mg/dL (ref 0.61–1.24)
GFR, Estimated: >60 mL/min (ref >60)
GFR, Estimated: >60 mL/min (ref >60)
Glucose, Bld: 132 mg/dL — ABNORMAL HIGH (ref 70–99)
Glucose, Bld: 175 mg/dL — ABNORMAL HIGH (ref 70–99)
Potassium: 3.3 mmol/L — ABNORMAL LOW (ref 3.5–5.1)
Potassium: 3.6 mmol/L (ref 3.5–5.1)
Sodium: 136 mmol/L (ref 135–145)
Sodium: 137 mmol/L (ref 135–145)

## 2021-03-09 LAB — GLUCOSE, CAPILLARY
Glucose-Capillary: 117 mg/dL — ABNORMAL HIGH (ref 70–99)
Glucose-Capillary: 127 mg/dL — ABNORMAL HIGH (ref 70–99)
Glucose-Capillary: 141 mg/dL — ABNORMAL HIGH (ref 70–99)
Glucose-Capillary: 151 mg/dL — ABNORMAL HIGH (ref 70–99)
Glucose-Capillary: 197 mg/dL — ABNORMAL HIGH (ref 70–99)
Glucose-Capillary: 216 mg/dL — ABNORMAL HIGH (ref 70–99)

## 2021-03-09 LAB — MAGNESIUM: Magnesium: 2.7 mg/dL — ABNORMAL HIGH (ref 1.7–2.4)

## 2021-03-09 LAB — PHOSPHORUS: Phosphorus: 5.1 mg/dL — ABNORMAL HIGH (ref 2.5–4.6)

## 2021-03-09 MED ORDER — DULOXETINE HCL 20 MG PO CPEP
40.0000 mg | ORAL_CAPSULE | Freq: Every day | ORAL | Status: DC
  Administered 2021-03-09 – 2021-03-18 (×10): 40 mg via ORAL
  Filled 2021-03-09 (×10): qty 2

## 2021-03-09 MED ORDER — POTASSIUM CHLORIDE 10 MEQ/100ML IV SOLN
10.0000 meq | INTRAVENOUS | Status: DC
  Administered 2021-03-09: 10 meq via INTRAVENOUS
  Filled 2021-03-09: qty 100

## 2021-03-09 MED ORDER — ATORVASTATIN CALCIUM 40 MG PO TABS
40.0000 mg | ORAL_TABLET | Freq: Every day | ORAL | Status: DC
  Administered 2021-03-09 – 2021-03-18 (×10): 40 mg via ORAL
  Filled 2021-03-09 (×10): qty 1

## 2021-03-09 MED ORDER — POTASSIUM CHLORIDE CRYS ER 20 MEQ PO TBCR
40.0000 meq | EXTENDED_RELEASE_TABLET | ORAL | Status: DC
  Administered 2021-03-09: 40 meq via ORAL
  Filled 2021-03-09: qty 2

## 2021-03-09 MED ORDER — GABAPENTIN 300 MG PO CAPS
300.0000 mg | ORAL_CAPSULE | Freq: Three times a day (TID) | ORAL | Status: DC
  Administered 2021-03-09 – 2021-03-18 (×28): 300 mg via ORAL
  Filled 2021-03-09 (×28): qty 1

## 2021-03-09 MED ORDER — REVEFENACIN 175 MCG/3ML IN SOLN
175.0000 ug | Freq: Every day | RESPIRATORY_TRACT | Status: DC
  Administered 2021-03-09 – 2021-03-18 (×9): 175 ug via RESPIRATORY_TRACT
  Filled 2021-03-09 (×10): qty 3

## 2021-03-09 MED ORDER — POLYETHYLENE GLYCOL 3350 17 G PO PACK
17.0000 g | PACK | Freq: Every day | ORAL | Status: DC
  Administered 2021-03-16: 10:00:00 17 g via ORAL
  Filled 2021-03-09 (×5): qty 1

## 2021-03-09 MED ORDER — POTASSIUM CHLORIDE CRYS ER 20 MEQ PO TBCR
40.0000 meq | EXTENDED_RELEASE_TABLET | Freq: Once | ORAL | Status: AC
  Administered 2021-03-09: 40 meq via ORAL
  Filled 2021-03-09: qty 2

## 2021-03-09 MED ORDER — POTASSIUM CHLORIDE CRYS ER 20 MEQ PO TBCR
40.0000 meq | EXTENDED_RELEASE_TABLET | ORAL | Status: AC
  Administered 2021-03-09 – 2021-03-10 (×4): 40 meq via ORAL
  Filled 2021-03-09 (×6): qty 2

## 2021-03-09 NOTE — Progress Notes (Signed)
Nutrition Follow-up  DOCUMENTATION CODES:   Morbid obesity  INTERVENTION:   Prosource Plus 30 ml PO BID, each packet provides 100 kcal and 15 gm protein. Ensure Enlive po daily, each supplement provides 350 kcal and 20 grams of protein Continue mechanical soft (D3-thin) diet.   NUTRITION DIAGNOSIS:   Inadequate oral intake related to inability to eat as evidenced by NPO status.  Ongoing   GOAL:   Patient will meet greater than or equal to 90% of their needs  Progressing   MONITOR:   Vent status, TF tolerance, Labs, I & O's  REASON FOR ASSESSMENT:   Ventilator    ASSESSMENT:   40 y.o. male presented with SOB. PMH includes CHF, DM, and HTN. Pt admitted with acute on chronic hypoxemic and hypercapnic respiratory failure.  Discussed patient in ICU rounds and with RN today. Extubated 12/14. TF off since extubation. Now on a dysphagia 3 diet with thin liquids d/t poor dentition. He consumed 75% of lunch yesterday, but intake of meals is poor overall per discussion with RN.   Labs reviewed. K 3.3, phos 5.1, mag 2.7 CBG: [phone]  Medications reviewed and include Novolog, Solumedrol, Miralax, KCl, Lasix.  Weight 215.3 kg on 12/13, down from 220.5 kg on 12/11.  Weight loss r/t negative fluid balance. I/O -13.4 L since admission.  Diet Order:   Diet Order             DIET DYS 3 Room service appropriate? Yes; Fluid consistency: Thin  Diet effective now                   EDUCATION NEEDS:   Not appropriate for education at this time  Skin:  Skin Assessment: Skin Integrity Issues: Skin Integrity Issues:: Other (Comment) Other: venous stasis ulcer to L ankle and R foot  Last BM:  12/16 type 7  Height:   Ht Readings from Last 1 Encounters:  03/04/21 6' 5" (1.956 m)    Weight:   Wt Readings from Last 1 Encounters:  03/06/21 (!) 215.3 kg    Ideal Body Weight:  94.5 kg  BMI:  Body mass index is 56.29 kg/m.  Estimated Nutritional Needs:    Kcal:  2000-2300  Protein:  150-200 gm  Fluid:  > 2 L    Lucas Mallow, RD, LDN, CNSC Please refer to Amion for contact information.
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
## 2021-03-10 LAB — BASIC METABOLIC PANEL
Anion gap: 12 (ref 5–15)
Anion gap: 15 (ref 5–15)
Anion gap: 13 (ref 5–15)
BUN: 56 mg/dL — ABNORMAL HIGH (ref 6–20)
BUN: 58 mg/dL — ABNORMAL HIGH (ref 6–20)
BUN: 56 mg/dL — ABNORMAL HIGH (ref 6–20)
CO2: 36 mmol/L — ABNORMAL HIGH (ref 22–32)
CO2: 32 mmol/L (ref 22–32)
CO2: 31 mmol/L (ref 22–32)
Calcium: 9.9 mg/dL (ref 8.9–10.3)
Calcium: 9.8 mg/dL (ref 8.9–10.3)
Calcium: 9.7 mg/dL (ref 8.9–10.3)
Chloride: 87 mmol/L — ABNORMAL LOW (ref 98–111)
Chloride: 88 mmol/L — ABNORMAL LOW (ref 98–111)
Chloride: 88 mmol/L — ABNORMAL LOW (ref 98–111)
Creatinine, Ser: 1.02 mg/dL (ref 0.61–1.24)
Creatinine, Ser: 0.97 mg/dL (ref 0.61–1.24)
Creatinine, Ser: 0.92 mg/dL (ref 0.61–1.24)
GFR, Estimated: >60 mL/min (ref >60)
GFR, Estimated: >60 mL/min (ref >60)
GFR, Estimated: >60 mL/min (ref >60)
Glucose, Bld: 130 mg/dL — ABNORMAL HIGH (ref 70–99)
Glucose, Bld: 118 mg/dL — ABNORMAL HIGH (ref 70–99)
Glucose, Bld: 173 mg/dL — ABNORMAL HIGH (ref 70–99)
Potassium: 4.4 mmol/L (ref 3.5–5.1)
Potassium: 4.3 mmol/L (ref 3.5–5.1)
Potassium: 4.9 mmol/L (ref 3.5–5.1)
Sodium: 135 mmol/L (ref 135–145)
Sodium: 135 mmol/L (ref 135–145)
Sodium: 132 mmol/L — ABNORMAL LOW (ref 135–145)

## 2021-03-10 LAB — CBC
HCT: 55.1 % — ABNORMAL HIGH (ref 39.0–52.0)
Hemoglobin: 17.8 g/dL — ABNORMAL HIGH (ref 13.0–17.0)
MCH: 30.9 pg (ref 26.0–34.0)
MCHC: 32.3 g/dL (ref 30.0–36.0)
MCV: 95.7 fL (ref 80.0–100.0)
Platelets: 288 10*3/uL (ref 150–400)
RBC: 5.76 MIL/uL (ref 4.22–5.81)
RDW: 14.6 % (ref 11.5–15.5)
WBC: 17.4 10*3/uL — ABNORMAL HIGH (ref 4.0–10.5)
nRBC: 0 % (ref 0.0–0.2)

## 2021-03-10 LAB — GLUCOSE, CAPILLARY
Glucose-Capillary: 139 mg/dL — ABNORMAL HIGH (ref 70–99)
Glucose-Capillary: 133 mg/dL — ABNORMAL HIGH (ref 70–99)
Glucose-Capillary: 208 mg/dL — ABNORMAL HIGH (ref 70–99)
Glucose-Capillary: 200 mg/dL — ABNORMAL HIGH (ref 70–99)
Glucose-Capillary: 261 mg/dL — ABNORMAL HIGH (ref 70–99)
Glucose-Capillary: 146 mg/dL — ABNORMAL HIGH (ref 70–99)

## 2021-03-10 MED ORDER — ACETAMINOPHEN 325 MG PO TABS
650.0000 mg | ORAL_TABLET | ORAL | Status: DC | PRN
  Administered 2021-03-10 – 2021-03-17 (×6): 650 mg via ORAL
  Filled 2021-03-10 (×6): qty 2
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
## 2021-03-11 LAB — CBC
HCT: 57 % — ABNORMAL HIGH (ref 39.0–52.0)
Hemoglobin: 18 g/dL — ABNORMAL HIGH (ref 13.0–17.0)
MCH: 30.2 pg (ref 26.0–34.0)
MCHC: 31.6 g/dL (ref 30.0–36.0)
MCV: 95.5 fL (ref 80.0–100.0)
Platelets: 310 10*3/uL (ref 150–400)
RBC: 5.97 MIL/uL — ABNORMAL HIGH (ref 4.22–5.81)
RDW: 14.5 % (ref 11.5–15.5)
WBC: 17.5 10*3/uL — ABNORMAL HIGH (ref 4.0–10.5)
nRBC: 0 % (ref 0.0–0.2)

## 2021-03-11 LAB — BASIC METABOLIC PANEL
Anion gap: 14 (ref 5–15)
Anion gap: 15 (ref 5–15)
BUN: 59 mg/dL — ABNORMAL HIGH (ref 6–20)
BUN: 62 mg/dL — ABNORMAL HIGH (ref 6–20)
CO2: 29 mmol/L (ref 22–32)
CO2: 28 mmol/L (ref 22–32)
Calcium: 9.6 mg/dL (ref 8.9–10.3)
Calcium: 9.5 mg/dL (ref 8.9–10.3)
Chloride: 87 mmol/L — ABNORMAL LOW (ref 98–111)
Chloride: 86 mmol/L — ABNORMAL LOW (ref 98–111)
Creatinine, Ser: 1.03 mg/dL (ref 0.61–1.24)
Creatinine, Ser: 1.03 mg/dL (ref 0.61–1.24)
GFR, Estimated: >60 mL/min (ref >60)
GFR, Estimated: >60 mL/min (ref >60)
Glucose, Bld: 249 mg/dL — ABNORMAL HIGH (ref 70–99)
Glucose, Bld: 218 mg/dL — ABNORMAL HIGH (ref 70–99)
Potassium: 4 mmol/L (ref 3.5–5.1)
Potassium: 5.1 mmol/L (ref 3.5–5.1)
Sodium: 130 mmol/L — ABNORMAL LOW (ref 135–145)
Sodium: 129 mmol/L — ABNORMAL LOW (ref 135–145)

## 2021-03-11 LAB — GLUCOSE, CAPILLARY
Glucose-Capillary: 141 mg/dL — ABNORMAL HIGH (ref 70–99)
Glucose-Capillary: 157 mg/dL — ABNORMAL HIGH (ref 70–99)
Glucose-Capillary: 184 mg/dL — ABNORMAL HIGH (ref 70–99)
Glucose-Capillary: 225 mg/dL — ABNORMAL HIGH (ref 70–99)
Glucose-Capillary: 255 mg/dL — ABNORMAL HIGH (ref 70–99)
Glucose-Capillary: 305 mg/dL — ABNORMAL HIGH (ref 70–99)

## 2021-03-11 MED ORDER — PREDNISONE 20 MG PO TABS
30.0000 mg | ORAL_TABLET | Freq: Every day | ORAL | Status: AC
  Administered 2021-03-11 – 2021-03-13 (×3): 30 mg via ORAL
  Filled 2021-03-11 (×3): qty 1

## 2021-03-11 MED ORDER — ORAL CARE MOUTH RINSE
15.0000 mL | Freq: Two times a day (BID) | OROMUCOSAL | Status: DC
  Administered 2021-03-11 – 2021-03-17 (×12): 15 mL via OROMUCOSAL

## 2021-03-11 MED ORDER — METOLAZONE 2.5 MG PO TABS
5.0000 mg | ORAL_TABLET | Freq: Once | ORAL | Status: AC
  Administered 2021-03-11: 10:00:00 5 mg via ORAL
  Filled 2021-03-11: qty 2

## 2021-03-11 MED ORDER — CYCLOBENZAPRINE HCL 5 MG PO TABS
5.0000 mg | ORAL_TABLET | Freq: Two times a day (BID) | ORAL | Status: DC | PRN
  Administered 2021-03-11 – 2021-03-17 (×6): 5 mg via ORAL
  Filled 2021-03-11 (×6): qty 1

## 2021-03-11 MED ORDER — PREDNISONE 20 MG PO TABS
20.0000 mg | ORAL_TABLET | Freq: Every day | ORAL | Status: AC
  Administered 2021-03-14 – 2021-03-16 (×3): 20 mg via ORAL
  Filled 2021-03-11 (×3): qty 1

## 2021-03-11 MED ORDER — PREDNISONE 10 MG PO TABS
10.0000 mg | ORAL_TABLET | Freq: Every day | ORAL | Status: DC
  Administered 2021-03-17 – 2021-03-18 (×2): 10 mg via ORAL
  Filled 2021-03-11 (×3): qty 1
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
## 2021-03-12 LAB — BASIC METABOLIC PANEL
Anion gap: 15 (ref 5–15)
Anion gap: 13 (ref 5–15)
BUN: 61 mg/dL — ABNORMAL HIGH (ref 6–20)
BUN: 62 mg/dL — ABNORMAL HIGH (ref 6–20)
CO2: 29 mmol/L (ref 22–32)
CO2: 37 mmol/L — ABNORMAL HIGH (ref 22–32)
Calcium: 9.4 mg/dL (ref 8.9–10.3)
Calcium: 9.9 mg/dL (ref 8.9–10.3)
Chloride: 89 mmol/L — ABNORMAL LOW (ref 98–111)
Chloride: 83 mmol/L — ABNORMAL LOW (ref 98–111)
Creatinine, Ser: 1.03 mg/dL (ref 0.61–1.24)
Creatinine, Ser: 1.22 mg/dL (ref 0.61–1.24)
GFR, Estimated: >60 mL/min (ref >60)
GFR, Estimated: >60 mL/min (ref >60)
Glucose, Bld: 125 mg/dL — ABNORMAL HIGH (ref 70–99)
Glucose, Bld: 136 mg/dL — ABNORMAL HIGH (ref 70–99)
Potassium: 4.4 mmol/L (ref 3.5–5.1)
Potassium: 4.5 mmol/L (ref 3.5–5.1)
Sodium: 133 mmol/L — ABNORMAL LOW (ref 135–145)
Sodium: 133 mmol/L — ABNORMAL LOW (ref 135–145)

## 2021-03-12 LAB — CBC
HCT: 58.2 % — ABNORMAL HIGH (ref 39.0–52.0)
Hemoglobin: 18.5 g/dL — ABNORMAL HIGH (ref 13.0–17.0)
MCH: 30.4 pg (ref 26.0–34.0)
MCHC: 31.8 g/dL (ref 30.0–36.0)
MCV: 95.6 fL (ref 80.0–100.0)
Platelets: 304 10*3/uL (ref 150–400)
RBC: 6.09 MIL/uL — ABNORMAL HIGH (ref 4.22–5.81)
RDW: 14.5 % (ref 11.5–15.5)
WBC: 16.9 10*3/uL — ABNORMAL HIGH (ref 4.0–10.5)
nRBC: 0 % (ref 0.0–0.2)

## 2021-03-12 LAB — GLUCOSE, CAPILLARY
Glucose-Capillary: 167 mg/dL — ABNORMAL HIGH (ref 70–99)
Glucose-Capillary: 168 mg/dL — ABNORMAL HIGH (ref 70–99)
Glucose-Capillary: 168 mg/dL — ABNORMAL HIGH (ref 70–99)
Glucose-Capillary: 197 mg/dL — ABNORMAL HIGH (ref 70–99)
Glucose-Capillary: 218 mg/dL — ABNORMAL HIGH (ref 70–99)
Glucose-Capillary: 236 mg/dL — ABNORMAL HIGH (ref 70–99)

## 2021-03-12 MED ORDER — METOLAZONE 2.5 MG PO TABS
5.0000 mg | ORAL_TABLET | Freq: Once | ORAL | Status: AC
  Administered 2021-03-12: 09:00:00 5 mg via ORAL
  Filled 2021-03-12: qty 2

## 2021-03-12 NOTE — Progress Notes (Signed)
Inpatient Diabetes Program Recommendations  AACE/ADA: New Consensus Statement on Inpatient Glycemic Control (2015)  Target Ranges:  Prepandial:   less than 140 mg/dL      Peak postprandial:   less than 180 mg/dL (1-2 hours)      Critically ill patients:  140 - 180 mg/dL   Lab Results  Component Value Date   GLUCAP 168 (H) 03/12/2021   HGBA1C 6.8 (H) 03/04/2021    Review of Glycemic Control  Latest Reference Range & Units 03/11/21 03:47 03/11/21 08:10 03/11/21 11:27 03/11/21 15:23 03/11/21 19:25 03/11/21 23:42 03/12/21 05:11 03/12/21 07:25  Glucose-Capillary 70 - 99 mg/dL 157 (H) 255 (H) 141 (H) 225 (H) 305 (H) 184 (H) 168 (H) 168 (H)   Diabetes history: DM 2 Outpatient Diabetes medications: farxiga in the past Current orders for Inpatient glycemic control:  Novolog 0-20 units Q4 hours  PO Prednisone 30 mg Daily A1c 6.8% on 12/11  Inpatient Diabetes Program Recommendations:    Glucose trends increase after meal intake and prednisone dose  -  consider Novolog 5 units tid meal coverage if eating >50% of meals  Thanks,  Tama Headings RN, MSN, BC-ADM Inpatient Diabetes Coordinator Team Pager [phone] (8a-5p)

## 2021-03-12 NOTE — Progress Notes (Signed)
Physical Therapy Treatment Patient Details Name: Joe Carter MRN: [id number] DOB: 1980-04-03 Today's Date: 03/12/2021   History of Present Illness 40 yo admitted 12/11 with SOB and AMS with metabolic encephalopathy, cocaine (+), mixed respiratory failure with intubation 12/11-12/14. PMhx:obesity, DM, HFrEF, asthma    PT Comments    Pt very pleasant and moving well this session with increased standing and mobility. Pt able to pivot to chair today but limited by drop in SPO2 to 78% end of session and bump from 80-90% FiO2. Pt educated for HEP, progression, transfers and OOB for toileting.   HHFNC 35L , 80-90% FIO2 HR 120    Recommendations for follow up therapy are one component of a multi-disciplinary discharge planning process, led by the attending physician.  Recommendations may be updated based on patient status, additional functional criteria and insurance authorization.  Follow Up Recommendations  Acute inpatient rehab (3hours/day)     Assistance Recommended at Discharge Frequent or constant Supervision/Assistance  Equipment Recommendations  Rolling walker (2 wheels)    Recommendations for Other Services       Precautions / Restrictions Precautions Precautions: Fall;Other (comment) Precaution Comments: SpO2>80%, obesity Restrictions Weight Bearing Restrictions: No     Mobility  Bed Mobility Overal bed mobility: Needs Assistance Bed Mobility: Supine to Sit     Supine to sit: Min guard     General bed mobility comments: HOb 15 degrees with pt able to transition to sitting EOB on left without physical assist, guarding for lines    Transfers Overall transfer level: Needs assistance   Transfers: Sit to/from Stand;Bed to chair/wheelchair/BSC Sit to Stand: Min guard     Step pivot transfers: Min guard     General transfer comment: pt able to stand from bed without physical assist, guarding for lines and safety with pivot to chair holding chair poles to  transfer desaturation to 82% on 80% FiO2 during transfer at 35L with slow recovery to 88% SPO2    Ambulation/Gait               General Gait Details: not yet able   Stairs             Wheelchair Mobility    Modified Rankin (Stroke Patients Only)       Balance Overall balance assessment: Mild deficits observed, not formally tested     Sitting balance - Comments: EOb without LOB       Standing balance comment: single UE on chair pole                            Cognition Arousal/Alertness: Awake/alert Behavior During Therapy: WFL for tasks assessed/performed Overall Cognitive Status: Within Functional Limits for tasks assessed                                          Exercises General Exercises - Lower Extremity Long Arc Quad: AROM;Both;10 reps;Seated Hip Flexion/Marching: AROM;Both;Seated;10 reps    General Comments        Pertinent Vitals/Pain Pain Assessment: 0-10 Pain Score: 4  Pain Location: right abdomen muscle spasm x 2 during session Pain Descriptors / Indicators: Spasm Pain Intervention(s): Limited activity within patient's tolerance;Monitored during session;Repositioned    Home Living  Prior Function            PT Goals (current goals can now be found in the care plan section) Progress towards PT goals: Progressing toward goals    Frequency    Min 3X/week      PT Plan      Co-evaluation              AM-PAC PT "6 Clicks" Mobility   Outcome Measure  Help needed turning from your back to your side while in a flat bed without using bedrails?: A Little Help needed moving from lying on your back to sitting on the side of a flat bed without using bedrails?: A Little Help needed moving to and from a bed to a chair (including a wheelchair)?: A Lot Help needed standing up from a chair using your arms (e.g., wheelchair or bedside chair)?: A Little Help needed to  walk in hospital room?: A Lot Help needed climbing 3-5 steps with a railing? : Total 6 Click Score: 14    End of Session Equipment Utilized During Treatment: Oxygen Activity Tolerance: Patient tolerated treatment well Patient left: in chair;with call bell/phone within reach Nurse Communication: Mobility status PT Visit Diagnosis: Other abnormalities of gait and mobility (R26.89);Difficulty in walking, not elsewhere classified (R26.2);Muscle weakness (generalized) (M62.81)     Time: 0830-0900 PT Time Calculation (min) (ACUTE ONLY): 30 min  Charges:  $Therapeutic Exercise: 8-22 mins $Therapeutic Activity: 8-22 mins                     Yonah Tangeman P, PT Acute Rehabilitation Services Pager: [phone] Office: [place] 03/12/2021, 10:56 AM

## 2021-03-12 NOTE — Progress Notes (Signed)
Pt placed on BIPAP for night rest. Placed a new mask on pat due to his leaking because of a tear in it.  Patient tolerating well at this time.
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
## 2021-03-12 NOTE — Progress Notes (Signed)
Patient removed from BiPAP at this time due to patient request. BiPAP continuously has to be repositioned due to large leak, alarming very often. Patient with complaints of a headache at this time. RN made aware. Patient placed back on the HFNC at 40Lpm and 90% FiO2. Vitals stable. Will continue to monitor.

## 2021-03-13 ENCOUNTER — Inpatient Hospital Stay (HOSPITAL_COMMUNITY): Payer: 59

## 2021-03-13 DIAGNOSIS — E119 Type 2 diabetes mellitus without complications: Secondary | ICD-10-CM

## 2021-03-13 DIAGNOSIS — E662 Morbid (severe) obesity with alveolar hypoventilation: Secondary | ICD-10-CM

## 2021-03-13 DIAGNOSIS — Q2730 Arteriovenous malformation, site unspecified: Secondary | ICD-10-CM

## 2021-03-13 LAB — GLUCOSE, CAPILLARY
Glucose-Capillary: 149 mg/dL — ABNORMAL HIGH (ref 70–99)
Glucose-Capillary: 156 mg/dL — ABNORMAL HIGH (ref 70–99)
Glucose-Capillary: 219 mg/dL — ABNORMAL HIGH (ref 70–99)
Glucose-Capillary: 247 mg/dL — ABNORMAL HIGH (ref 70–99)
Glucose-Capillary: 146 mg/dL — ABNORMAL HIGH (ref 70–99)
Glucose-Capillary: 142 mg/dL — ABNORMAL HIGH (ref 70–99)

## 2021-03-13 LAB — BASIC METABOLIC PANEL
Anion gap: 13 (ref 5–15)
Anion gap: 14 (ref 5–15)
BUN: 68 mg/dL — ABNORMAL HIGH (ref 6–20)
BUN: 68 mg/dL — ABNORMAL HIGH (ref 6–20)
CO2: 34 mmol/L — ABNORMAL HIGH (ref 22–32)
CO2: 33 mmol/L — ABNORMAL HIGH (ref 22–32)
Calcium: 9.7 mg/dL (ref 8.9–10.3)
Calcium: 9.7 mg/dL (ref 8.9–10.3)
Chloride: 85 mmol/L — ABNORMAL LOW (ref 98–111)
Chloride: 82 mmol/L — ABNORMAL LOW (ref 98–111)
Creatinine, Ser: 1.23 mg/dL (ref 0.61–1.24)
Creatinine, Ser: 1.12 mg/dL (ref 0.61–1.24)
GFR, Estimated: >60 mL/min (ref >60)
GFR, Estimated: >60 mL/min (ref >60)
Glucose, Bld: 146 mg/dL — ABNORMAL HIGH (ref 70–99)
Glucose, Bld: 117 mg/dL — ABNORMAL HIGH (ref 70–99)
Potassium: 3.7 mmol/L (ref 3.5–5.1)
Potassium: 4.9 mmol/L (ref 3.5–5.1)
Sodium: 132 mmol/L — ABNORMAL LOW (ref 135–145)
Sodium: 129 mmol/L — ABNORMAL LOW (ref 135–145)

## 2021-03-13 LAB — CBC
HCT: 57.4 % — ABNORMAL HIGH (ref 39.0–52.0)
Hemoglobin: 18.9 g/dL — ABNORMAL HIGH (ref 13.0–17.0)
MCH: 31.2 pg (ref 26.0–34.0)
MCHC: 32.9 g/dL (ref 30.0–36.0)
MCV: 94.7 fL (ref 80.0–100.0)
Platelets: 295 10*3/uL (ref 150–400)
RBC: 6.06 MIL/uL — ABNORMAL HIGH (ref 4.22–5.81)
RDW: 14.2 % (ref 11.5–15.5)
WBC: 17.2 10*3/uL — ABNORMAL HIGH (ref 4.0–10.5)
nRBC: 0 % (ref 0.0–0.2)

## 2021-03-13 MED ORDER — ENOXAPARIN SODIUM 100 MG/ML IJ SOSY
100.0000 mg | PREFILLED_SYRINGE | INTRAMUSCULAR | Status: DC
  Administered 2021-03-13 – 2021-03-17 (×5): 100 mg via SUBCUTANEOUS
  Filled 2021-03-13 (×7): qty 1

## 2021-03-13 MED ORDER — INSULIN ASPART 100 UNIT/ML IJ SOLN
0.0000 [IU] | Freq: Three times a day (TID) | INTRAMUSCULAR | Status: DC
  Administered 2021-03-13: 21:00:00 3 [IU] via SUBCUTANEOUS
  Administered 2021-03-14 (×2): 4 [IU] via SUBCUTANEOUS
  Administered 2021-03-14: 22:00:00 15 [IU] via SUBCUTANEOUS
  Administered 2021-03-14: 17:00:00 7 [IU] via SUBCUTANEOUS
  Administered 2021-03-15: 09:00:00 11 [IU] via SUBCUTANEOUS
  Administered 2021-03-15 (×3): 7 [IU] via SUBCUTANEOUS
  Administered 2021-03-16: 20:00:00 11 [IU] via SUBCUTANEOUS
  Administered 2021-03-16: 17:00:00 15 [IU] via SUBCUTANEOUS
  Administered 2021-03-16: 10:00:00 4 [IU] via SUBCUTANEOUS
  Administered 2021-03-16: 13:00:00 15 [IU] via SUBCUTANEOUS
  Administered 2021-03-17: 11:00:00 7 [IU] via SUBCUTANEOUS
  Administered 2021-03-17: 17:00:00 4 [IU] via SUBCUTANEOUS
  Administered 2021-03-17: 13:00:00 15 [IU] via SUBCUTANEOUS
  Administered 2021-03-17 – 2021-03-18 (×3): 4 [IU] via SUBCUTANEOUS

## 2021-03-13 MED ORDER — IOHEXOL 350 MG/ML SOLN
100.0000 mL | Freq: Once | INTRAVENOUS | Status: AC | PRN
  Administered 2021-03-13: 17:00:00 100 mL via INTRAVENOUS

## 2021-03-13 NOTE — Progress Notes (Signed)
Occupational Therapy Treatment Patient Details Name: Joe Carter MRN: [id number] DOB: 1980-04-09 Today's Date: 03/13/2021   History of present illness 40 yo admitted 12/11 with SOB and AMS with metabolic encephalopathy, cocaine (+), mixed respiratory failure with intubation 12/11-12/14. PMhx:obesity, DM, HFrEF, asthma   OT comments  Pt progressing towards acute OT goals. Able to take pivotal steps in full circle at min guard level, using single UE support. Min guard for bed mobility. Seated: HR 103-115. O2 92, RR 20-25, BP 135/119. FiO2 during transfer at [facility]. D/c plan remains appropriate.    Recommendations for follow up therapy are one component of a multi-disciplinary discharge planning process, led by the attending physician.  Recommendations may be updated based on patient status, additional functional criteria and insurance authorization.    Follow Up Recommendations  Acute inpatient rehab (3hours/day)    Assistance Recommended at Discharge Intermittent Supervision/Assistance  Equipment Recommendations  Other (comment) (TBD)    Recommendations for Other Services      Precautions / Restrictions Precautions Precautions: Fall;Other (comment) Precaution Comments: SpO2>85%, obesity Restrictions Weight Bearing Restrictions: No       Mobility Bed Mobility Overal bed mobility: Needs Assistance Bed Mobility: Sit to Supine       Sit to supine: Min guard        Transfers Overall transfer level: Needs assistance Equipment used: 1 person hand held assist Transfers: Sit to/from Stand;Bed to chair/wheelchair/BSC Sit to Stand: Min guard Stand pivot transfers: Min guard         General transfer comment: pivoted in full circle d/t lines. min guard 2 for safety with lines     Balance Overall balance assessment: Mild deficits observed, not formally tested   Sitting balance-Leahy Scale: Fair       Standing balance-Leahy Scale: Poor Standing balance comment: seeks  single UE support                           ADL either performed or assessed with clinical judgement   ADL Overall ADL's : Needs assistance/impaired                         Toilet Transfer: Min guard;Stand-pivot;+2 for safety/equipment             General ADL Comments: min guard assist for taking pivotal steps, +2 for safety with lines/leads    Extremity/Trunk Assessment Upper Extremity Assessment Upper Extremity Assessment: Generalized weakness   Lower Extremity Assessment Lower Extremity Assessment: Defer to PT evaluation        Vision       Perception     Praxis      Cognition Arousal/Alertness: Awake/alert Behavior During Therapy: Flat affect;WFL for tasks assessed/performed Overall Cognitive Status: Within Functional Limits for tasks assessed                                 General Comments: Pt upset that he was just told by MD that he won't be able to go home tomorrow. Joe Carter is scheduled to be born today. Offered listening space and chaplain consult, pt agreeable to consult.          Exercises     Shoulder Instructions       General Comments      Pertinent Vitals/ Pain       Pain Assessment: Faces Faces Pain Scale: Hurts little more  Pain Location: right abdomen muscle spasm 1x during OTsession Pain Descriptors / Indicators: Spasm Pain Intervention(s): Monitored during session  Home Living                                          Prior Functioning/Environment              Frequency  Min 2X/week        Progress Toward Goals  OT Goals(current goals can now be found in the care plan section)  Progress towards OT goals: Progressing toward goals  Acute Rehab OT Goals Patient Stated Goal: regain independence, see grandson who is due to be born 12/21 OT Goal Formulation: With patient Time For Goal Achievement: 03/22/21 Potential to Achieve Goals: Good ADL Goals Pt Will Perform  Grooming: with set-up;sitting Pt Will Perform Upper Body Bathing: with set-up;sitting Pt Will Perform Lower Body Bathing: with mod assist;sit to/from stand Pt Will Transfer to Toilet: with mod assist;ambulating Pt Will Perform Toileting - Clothing Manipulation and hygiene: with mod assist;sit to/from stand  Plan Discharge plan remains appropriate    Co-evaluation                 AM-PAC OT "6 Clicks" Daily Activity     Outcome Measure   Help from another person eating meals?: None Help from another person taking care of personal grooming?: A Little Help from another person toileting, which includes using toliet, bedpan, or urinal?: A Lot Help from another person bathing (including washing, rinsing, drying)?: A Lot Help from another person to put on and taking off regular upper body clothing?: A Lot Help from another person to put on and taking off regular lower body clothing?: Total 6 Click Score: 14    End of Session Equipment Utilized During Treatment: Oxygen  OT Visit Diagnosis: Unsteadiness on feet (R26.81);Muscle weakness (generalized) (M62.81)   Activity Tolerance Patient limited by fatigue;Patient tolerated treatment well   Patient Left in bed;with call bell/phone within reach   Nurse Communication Other (comment) (nurse present for session)        Time: 8485-9276 OT Time Calculation (min): 21 min  Charges: OT General Charges $OT Visit: 1 Visit OT Treatments $Self Care/Home Management : 8-22 mins  Joe Carter, OT Acute Rehabilitation Services Office: [phone]   Joe Carter 03/13/2021, 2:33 PM

## 2021-03-13 NOTE — Progress Notes (Signed)
Physical Therapy Treatment Patient Details Name: Joe Carter MRN: [id number] DOB: 06/07/1980 Today's Date: 03/13/2021   History of Present Illness 39 yo admitted 12/11 with SOB and AMS with metabolic encephalopathy, cocaine (+), mixed respiratory failure with intubation 12/11-12/14. PMhx:obesity, DM, HFrEF, asthma    PT Comments    Pt pleasant and progressing with activity able to stand x 3 trials this session, pivot to chair and perform bil LE HEP. Pt with seated rest between standing trials with pt dropping to 86% on 80% at 30L and down to 70% FiO2 end of session at 94%. Pt with education for HEP and will continue to work toward increased mobility.   HR 123-130    Recommendations for follow up therapy are one component of a multi-disciplinary discharge planning process, led by the attending physician.  Recommendations may be updated based on patient status, additional functional criteria and insurance authorization.  Follow Up Recommendations  Acute inpatient rehab (3hours/day)     Assistance Recommended at Discharge Frequent or constant Supervision/Assistance  Equipment Recommendations  Rolling walker (2 wheels)    Recommendations for Other Services       Precautions / Restrictions Precautions Precautions: Fall;Other (comment) Precaution Comments: SpO2>85%, obesity Restrictions Weight Bearing Restrictions: No     Mobility  Bed Mobility Overal bed mobility: Needs Assistance Bed Mobility: Supine to Sit     Supine to sit: Min guard     General bed mobility comments: guarding for safety and lines    Transfers Overall transfer level: Needs assistance   Transfers: Sit to/from Stand;Bed to chair/wheelchair/BSC Sit to Stand: Min guard     Step pivot transfers: Min guard     General transfer comment: pt able to stand from bed without physical assist, guarding for lines and safety with pivot to chair holding chair poles to transfer desaturation to 86% on 80% FiO2  during transfer at 30L with recovery to 92% SPO2. pt stood with standing marching x 10 then forward/backward stepping at chair 2 steps forward and back x 3    Ambulation/Gait               General Gait Details: not yet able   Stairs             Wheelchair Mobility    Modified Rankin (Stroke Patients Only)       Balance Overall balance assessment: Mild deficits observed, not formally tested   Sitting balance-Leahy Scale: Fair Sitting balance - Comments: EOb without LOB       Standing balance comment: single UE on chair pole                            Cognition Arousal/Alertness: Awake/alert Behavior During Therapy: WFL for tasks assessed/performed Overall Cognitive Status: Within Functional Limits for tasks assessed                                          Exercises General Exercises - Lower Extremity Long Arc Quad: AROM;Both;Seated;20 reps Hip Flexion/Marching: AROM;Both;10 reps;Standing    General Comments        Pertinent Vitals/Pain Pain Assessment: No/denies pain    Home Living                          Prior Function  PT Goals (current goals can now be found in the care plan section) Progress towards PT goals: Progressing toward goals    Frequency    Min 3X/week      PT Plan Current plan remains appropriate    Co-evaluation              AM-PAC PT "6 Clicks" Mobility   Outcome Measure  Help needed turning from your back to your side while in a flat bed without using bedrails?: A Little Help needed moving from lying on your back to sitting on the side of a flat bed without using bedrails?: A Little Help needed moving to and from a bed to a chair (including a wheelchair)?: A Lot Help needed standing up from a chair using your arms (e.g., wheelchair or bedside chair)?: A Little Help needed to walk in hospital room?: A Lot Help needed climbing 3-5 steps with a railing? :  Total 6 Click Score: 14    End of Session Equipment Utilized During Treatment: Oxygen Activity Tolerance: Patient tolerated treatment well Patient left: in chair;with call bell/phone within reach Nurse Communication: Mobility status PT Visit Diagnosis: Other abnormalities of gait and mobility (R26.89);Difficulty in walking, not elsewhere classified (R26.2);Muscle weakness (generalized) (M62.81)     Time: 8937-3428 PT Time Calculation (min) (ACUTE ONLY): 25 min  Charges:  $Therapeutic Exercise: 8-22 mins $Therapeutic Activity: 8-22 mins                     Chanler Mendonca P, PT Acute Rehabilitation Services Pager: [phone] Office: [place] [facility] 03/13/2021, 9:16 AM
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
## 2021-03-13 NOTE — Progress Notes (Signed)
Pt placed on BIPAP for night rest.  RT will continue to monitor throughout the night.

## 2021-03-14 DIAGNOSIS — Z6841 Body Mass Index (BMI) 40.0 and over, adult: Secondary | ICD-10-CM

## 2021-03-14 LAB — GLUCOSE, CAPILLARY
Glucose-Capillary: 181 mg/dL — ABNORMAL HIGH (ref 70–99)
Glucose-Capillary: 177 mg/dL — ABNORMAL HIGH (ref 70–99)
Glucose-Capillary: 222 mg/dL — ABNORMAL HIGH (ref 70–99)
Glucose-Capillary: 310 mg/dL — ABNORMAL HIGH (ref 70–99)
Glucose-Capillary: 222 mg/dL — ABNORMAL HIGH (ref 70–99)

## 2021-03-14 LAB — CBC
HCT: 57.4 % — ABNORMAL HIGH (ref 39.0–52.0)
Hemoglobin: 19.1 g/dL — ABNORMAL HIGH (ref 13.0–17.0)
MCH: 31.2 pg (ref 26.0–34.0)
MCHC: 33.3 g/dL (ref 30.0–36.0)
MCV: 93.8 fL (ref 80.0–100.0)
Platelets: 238 10*3/uL (ref 150–400)
RBC: 6.12 MIL/uL — ABNORMAL HIGH (ref 4.22–5.81)
RDW: 14.1 % (ref 11.5–15.5)
WBC: 16.3 10*3/uL — ABNORMAL HIGH (ref 4.0–10.5)
nRBC: 0 % (ref 0.0–0.2)

## 2021-03-14 LAB — BASIC METABOLIC PANEL
Anion gap: 12 (ref 5–15)
Anion gap: 13 (ref 5–15)
BUN: 67 mg/dL — ABNORMAL HIGH (ref 6–20)
BUN: 66 mg/dL — ABNORMAL HIGH (ref 6–20)
CO2: 34 mmol/L — ABNORMAL HIGH (ref 22–32)
CO2: 27 mmol/L (ref 22–32)
Calcium: 9.4 mg/dL (ref 8.9–10.3)
Calcium: 9.1 mg/dL (ref 8.9–10.3)
Chloride: 82 mmol/L — ABNORMAL LOW (ref 98–111)
Chloride: 83 mmol/L — ABNORMAL LOW (ref 98–111)
Creatinine, Ser: 1.22 mg/dL (ref 0.61–1.24)
Creatinine, Ser: 1.25 mg/dL — ABNORMAL HIGH (ref 0.61–1.24)
GFR, Estimated: >60 mL/min (ref >60)
GFR, Estimated: >60 mL/min (ref >60)
Glucose, Bld: 226 mg/dL — ABNORMAL HIGH (ref 70–99)
Glucose, Bld: 347 mg/dL — ABNORMAL HIGH (ref 70–99)
Potassium: 3.9 mmol/L (ref 3.5–5.1)
Potassium: 4.5 mmol/L (ref 3.5–5.1)
Sodium: 128 mmol/L — ABNORMAL LOW (ref 135–145)
Sodium: 123 mmol/L — ABNORMAL LOW (ref 135–145)

## 2021-03-14 MED ORDER — FUROSEMIDE 10 MG/ML IJ SOLN
80.0000 mg | Freq: Two times a day (BID) | INTRAMUSCULAR | Status: DC
  Administered 2021-03-14 – 2021-03-16 (×6): 80 mg via INTRAVENOUS
  Filled 2021-03-14 (×7): qty 8

## 2021-03-14 MED ORDER — INSULIN ASPART 100 UNIT/ML IJ SOLN
5.0000 [IU] | Freq: Three times a day (TID) | INTRAMUSCULAR | Status: DC
  Administered 2021-03-14 – 2021-03-17 (×10): 5 [IU] via SUBCUTANEOUS
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
## 2021-03-14 NOTE — Progress Notes (Signed)
RT removed pt from heated HFNC and placed pt on 12L HFNC. Pt tolerating well at this time. RN aware, MD aware, RT will continue to monitor.     03/14/21 1130  Therapy Vitals  Pulse Rate 94  Resp 16  MEWS Score/Color  MEWS Score 0  MEWS Score Color Green  Oxygen Therapy/Pulse Ox  O2 Device (S)  HFNC ([place])  O2 Therapy Oxygen humidified  O2 Flow Rate (L/min) 12 L/min  SpO2 96 %

## 2021-03-14 NOTE — TOC Initial Note (Signed)
Transition of Care [facility]) - Initial/Assessment Note    Patient Details  Name: Joe Carter MRN: [id number] Date of Birth: 03-17-1981  [facility]) CM/SW Contact:    Sharin Mons, RN Phone Number: 03/14/2021, 3:57 PM  Clinical Narrative:                 Admitted with acute on chronic hypoxemic and hypercapnic respiratory failure. From home with family. States PTA independent with ADL's. DME : home oxygen provided by [employer]. States without bipap.  States has problems affording copay on medications. States income limited. Works for [employer]. States little monies left after paying basic house hold expenses. States has applied for Medicaid, determination pending. NCM shared [facility] and [facility] pharmacy. Pt stated interested. Appointment scheduled  and noted on AVS . Clinic can address medication issues and assist with needs.  TOC team following and will assist with needs.   Expected Discharge Plan: Home/Self Care Barriers to Discharge: Continued Medical Work up   Patient Goals and CMS Choice        Expected Discharge Plan and Services Expected Discharge Plan: Home/Self Care   Discharge Planning Services: CM Consult   Living arrangements for the past 2 months:  (mom, brother , aunt and uncle)                                      Prior Living Arrangements/Services Living arrangements for the past 2 months:  (mom, brother , aunt and uncle) Lives with:: Relatives Patient language and need for interpreter reviewed:: Yes Do you feel safe going back to the place where you live?: Yes      Need for Family Participation in Patient Care: Yes (Comment) Care giver support system in place?: Yes (comment)   Criminal Activity/Legal Involvement Pertinent to Current Situation/Hospitalization: No - Comment as needed  Activities of Daily Living      Permission Sought/Granted   Permission granted to share information with : Yes, Verbal Permission Granted  Share  Information with NAME: Joe Carter (Mother)  [phone]           Emotional Assessment Appearance:: Appears stated age Attitude/Demeanor/Rapport: Engaged Affect (typically observed): Accepting Orientation: : Oriented to Self, Oriented to Place, Oriented to  Time, Oriented to Situation Alcohol / Substance Use: Tobacco Use (states doesn't need help to stop smoking) Psych Involvement: No (comment)  Admission diagnosis:  Severe persistent asthma with exacerbation [J45.51] Acute respiratory failure with hypoxia (HCC) [J96.01] History of ETT [Z92.89] Acute on chronic respiratory failure with hypoxia and hypercapnia (HCC) [U82.80, J96.22] Acute on chronic congestive heart failure, unspecified heart failure type [facility]) [I50.9] Patient Active Problem List   Diagnosis Date Noted   Acute respiratory failure with hypoxia ([place]) 03/04/2021   Acute on chronic heart failure ([place]) 12/22/2020   Acute on chronic diastolic (congestive) heart failure ([place]) 12/12/2020   Foot ulcer, right, with fat layer exposed ([place]) 11/29/2020   Diabetes mellitus ([place]) 11/29/2020   Obesity hypoventilation syndrome ([place]) 03/49/1791   Diastolic heart failure ([place])    Acute respiratory failure with hypoxia and hypercarbia ([place]) 07/06/2020   Morbid obesity with BMI of 60.0-69.9, adult ([place]) 04/13/2020   Neuropathy 08/01/2016   Back pain 08/01/2016   Essential hypertension 08/01/2016   PCP:  Mike Craze, DO Pharmacy:   [id number] [place] [place] [address]Phone:: [phone]:  [facility] [address] [place] [address] Phone: [phone] Fax: [phone]     Social Determinants of Health (SDOH) Interventions    Readmission Risk Interventions No flowsheet data found.

## 2021-03-14 NOTE — Plan of Care (Signed)

## 2021-03-14 NOTE — Consult Note (Signed)
° °  [facility] Inpatient Consult   03/14/2021  Joe Carter 11-16-1980 [id number]  [place] [facility] [ACO] Patient: [facility]  Primary Care Provider:  Della Goo, [facility] is an embedded provider with a Chronic Care Management team and program, and is listed for the transition of care follow up and appointments.  Patient was screened for Embedded practice service needs for chronic care management and encounters found patient to be outreach with [place] Social Worker for Chronic Care Management without success  Plan: Notification sent to the [place] Management and made aware of TOC needs for post hospital needs.  Please contact for further questions,  Natividad Brood, RN BSN [facility] Liaison  [phone] business mobile phone Toll free office [phone]  Fax number: [phone] [email or web address]

## 2021-03-15 LAB — BASIC METABOLIC PANEL
Anion gap: 10 (ref 5–15)
Anion gap: 13 (ref 5–15)
BUN: 57 mg/dL — ABNORMAL HIGH (ref 6–20)
BUN: 49 mg/dL — ABNORMAL HIGH (ref 6–20)
CO2: 34 mmol/L — ABNORMAL HIGH (ref 22–32)
CO2: 28 mmol/L (ref 22–32)
Calcium: 9.3 mg/dL (ref 8.9–10.3)
Calcium: 9 mg/dL (ref 8.9–10.3)
Chloride: 84 mmol/L — ABNORMAL LOW (ref 98–111)
Chloride: 84 mmol/L — ABNORMAL LOW (ref 98–111)
Creatinine, Ser: 1.06 mg/dL (ref 0.61–1.24)
Creatinine, Ser: 1.08 mg/dL (ref 0.61–1.24)
GFR, Estimated: >60 mL/min (ref >60)
GFR, Estimated: >60 mL/min (ref >60)
Glucose, Bld: 194 mg/dL — ABNORMAL HIGH (ref 70–99)
Glucose, Bld: 262 mg/dL — ABNORMAL HIGH (ref 70–99)
Potassium: 3.2 mmol/L — ABNORMAL LOW (ref 3.5–5.1)
Potassium: 4 mmol/L (ref 3.5–5.1)
Sodium: 128 mmol/L — ABNORMAL LOW (ref 135–145)
Sodium: 125 mmol/L — ABNORMAL LOW (ref 135–145)

## 2021-03-15 LAB — CBC
HCT: 52.3 % — ABNORMAL HIGH (ref 39.0–52.0)
Hemoglobin: 16.8 g/dL (ref 13.0–17.0)
MCH: 30.4 pg (ref 26.0–34.0)
MCHC: 32.1 g/dL (ref 30.0–36.0)
MCV: 94.6 fL (ref 80.0–100.0)
Platelets: 275 10*3/uL (ref 150–400)
RBC: 5.53 MIL/uL (ref 4.22–5.81)
RDW: 13.8 % (ref 11.5–15.5)
WBC: 16 10*3/uL — ABNORMAL HIGH (ref 4.0–10.5)
nRBC: 0 % (ref 0.0–0.2)

## 2021-03-15 LAB — GLUCOSE, CAPILLARY
Glucose-Capillary: 145 mg/dL — ABNORMAL HIGH (ref 70–99)
Glucose-Capillary: 175 mg/dL — ABNORMAL HIGH (ref 70–99)
Glucose-Capillary: 202 mg/dL — ABNORMAL HIGH (ref 70–99)
Glucose-Capillary: 227 mg/dL — ABNORMAL HIGH (ref 70–99)
Glucose-Capillary: 245 mg/dL — ABNORMAL HIGH (ref 70–99)
Glucose-Capillary: 284 mg/dL — ABNORMAL HIGH (ref 70–99)

## 2021-03-15 LAB — MAGNESIUM: Magnesium: 2.6 mg/dL — ABNORMAL HIGH (ref 1.7–2.4)

## 2021-03-15 MED ORDER — ACETAZOLAMIDE 250 MG PO TABS
500.0000 mg | ORAL_TABLET | Freq: Once | ORAL | Status: AC
  Administered 2021-03-15: 13:00:00 500 mg via ORAL
  Filled 2021-03-15: qty 2

## 2021-03-15 MED ORDER — POTASSIUM CHLORIDE CRYS ER 20 MEQ PO TBCR
40.0000 meq | EXTENDED_RELEASE_TABLET | Freq: Two times a day (BID) | ORAL | Status: AC
  Administered 2021-03-15 (×2): 40 meq via ORAL
  Filled 2021-03-15 (×2): qty 2

## 2021-03-15 MED ORDER — GLUCERNA SHAKE PO LIQD
237.0000 mL | Freq: Three times a day (TID) | ORAL | Status: DC
  Administered 2021-03-15 – 2021-03-18 (×8): 237 mL via ORAL
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
## 2021-03-15 NOTE — Progress Notes (Addendum)
Subjective:  O/N: Transferred from ICU to Progressive  Joe Carter was seen and evaluated at bedside. Joe Carter states that Joe Carter is doing well this morning. Joe Carter said Joe Carter slept well last night, tolerated BiPAP. It did wake him up when Joe Carter would beep through the night, otherwise Joe Carter slept well.  Joe Carter denies any headaches, nausea, vomiting, fevers, chest pain, or abdominal pain.  Does endorse a history of uncontrolled asthma, but feels like it is coming to the hospital his asthma symptoms have been doing better. Joe Carter does state that Joe Carter feels like Joe Carter has to use the restroom.  Objective:  Vital signs in last 24 hours: Vitals:   03/14/21 2135 03/14/21 2322 03/15/21 0020 03/15/21 0357  BP:  (!) 140/99  (!) 126/97  Pulse:  95 (!) 102 89  Resp:  _0 Temp:  97.7 F (36.5 C)  (!) 97.5 F (36.4 C)  TempSrc:  Oral    SpO2: 97% 97% 97% 99%  Weight:      Height:       Physical Exam Constitutional:      General: Joe Carter is not in acute distress.    Appearance: Joe Carter is obese. Joe Carter is not diaphoretic.     Comments: Lying comfortably in bed, alert, morbidly obese, no acute distress  HENT:     Head: Normocephalic and atraumatic.  Cardiovascular:     Rate and Rhythm: Normal rate and regular rhythm.     Pulses: Normal pulses.     Heart sounds: No murmur heard.   No gallop.  Pulmonary:     Effort: Pulmonary effort is normal.     Comments: Saturating at 95% on 10 L HFNC, being in full senses, crackles and decreased breath sounds appreciated at the lower lung bases bilaterally. Chest:     Chest wall: No tenderness.  Abdominal:     General: Abdomen is flat. Bowel sounds are normal.     Palpations: Abdomen is soft.     Tenderness: There is no abdominal tenderness.  Neurological:     Mental Status: Joe Carter is alert.     Assessment/Plan:  Principal Problem:   Acute respiratory failure with hypoxia (HCC) Active Problems:   Morbid obesity with BMI of 60.0-69.9, adult (HCC)   Diastolic heart failure (HCC)   Obesity  hypoventilation syndrome (Joe Carter)   Diabetes mellitus (Joe Carter)   In summary, Joe Carter is a 40 year old male with a history HFpEF, asthma, and morbid obesity who initially presented on the 11th for respiratory failure, in the setting of being out of medications for his chronic conditions several months and smoking crack with possible opiate contamination, which required vent support, and Joe Carter successfully extubated to BiPAP on the 12/14. Joe Carter has received Rocephin for CAP coverage, Steroids, and IV Lasix.  Intracardiac and intrapulmonary shunting has been ruled out via CT angio chest.  Joe Carter is approximately -16 L on Lasix. Tolerating high flow oxygen and was transferred to progressive on 03/15/2021.  Joe Carter is a 40 year old male with morbid obesity, HFpEF asthma, OHS/OSA, who presented with shortness of breath and decreased mentation and was admitted for acute on chronic hypoxic and hypercapnic respiratory failure.  #Multi-Etiological Acute on Chronic Hypoxic and Hypercapnic Respiratory Failure likely 2/2 to AoC HFpEF, OHS/OSA, Asthma Exacerbation Afebrile overnight. Patient continues to require supplemental O2 (On 3L at home), Joe Carter is currently saturating at 95% on 10 L.  On physical exam, crackles are appreciated at the lower lung bases bilaterally Joe Carter has diuresed off 1 L,  and his total net negative 1.5 L. We will continue to diurese and to continue working with PT and OT. - Brovana 15 MCG twice daily - Pulmicort 0.5 mg twice daily - Albuterol every 4 hours as needed - Yupelri 175 MCG daily - Continue prednisone taper, 20 mg within end date of 12/23, 10 mg 12/24-12/27 - Mucinex 600 mg twice daily - Continue IV Lasix 80 mg twice daily - Trend BMP replenish potassium to greater than 4 and magnesium greater than 2 in the setting of HF - Nightly BiPAP  Hx of DM: Farxiga at home last A1c was 6.8 currently on a steroid taper which was reduced yesterday.  CBG this 175, will continue to observe and make  adjustments as needed. - Resistant SSI - NovoLog 5 units mealtime  Neuropathy: - Continue gabapentin 300 mg 3 times daily - Continue duloxetine 20 mg daily  HLD - Atorvastatin 40 mg daily  Prior to Admission Living Arrangement: Home Anticipated Discharge Location: Pending medical work-up, inpatient rehab Barriers to Discharge: Continue medical work-up Dispo: Anticipated discharge in approximately 3-5 day(s).   Joe Mercury, MD 03/15/2021, 6:46 AM After 5pm on weekdays and 1pm on weekends: On Call pager [phone]   Internal Medicine Attending:   I saw and examined the patient. I reviewed the residents note and I agree with the residents findings and plan as documented in the residents note.  Joe Brothers, MD

## 2021-03-15 NOTE — Progress Notes (Addendum)
Inpatient Diabetes Program Recommendations  AACE/ADA: New Consensus Statement on Inpatient Glycemic Control (2015)  Target Ranges:  Prepandial:   less than 140 mg/dL      Peak postprandial:   less than 180 mg/dL (1-2 hours)      Critically ill patients:  140 - 180 mg/dL   Lab Results  Component Value Date   GLUCAP 227 (H) 03/15/2021   HGBA1C 6.8 (H) 03/04/2021    Review of Glycemic Control  Latest Reference Range & Units 03/15/21 03:53 03/15/21 08:21 03/15/21 11:28  Glucose-Capillary 70 - 99 mg/dL 175 (H) 284 (H) 227 (H)  (H): Data is abnormally high Diabetes history: DM 2 Outpatient Diabetes medications: farxiga in the past Current orders for Inpatient glycemic control:  Novolog 0-20 units TID & HS, Novolog 5 units TID   PO Prednisone 20 mg Daily A1c 6.8% on 12/11   Inpatient Diabetes Program Recommendations:     In the setting of steroids, consider increasing Novolog 8 units TID (Assuming patient is consuming >50% of meals).   Thanks, Bronson Curb, MSN, RNC-OB Diabetes Coordinator [phone] (8a-5p)

## 2021-03-15 NOTE — Progress Notes (Signed)
Nutrition Follow-up  DOCUMENTATION CODES:   Morbid obesity  INTERVENTION:  -Glucerna Shake po TID, each supplement provides 220 kcal and 10 grams of protein  NUTRITION DIAGNOSIS:   Inadequate oral intake related to inability to eat as evidenced by NPO status.  Progressing, pt on dysphagia 3 diet with thin liquids   GOAL:   Patient will meet greater than or equal to 90% of their needs  progressing  MONITOR:   Vent status, TF tolerance, Labs, I & O's  REASON FOR ASSESSMENT:   Ventilator    ASSESSMENT:   40 y.o. male presented with SOB. PMH includes CHF, DM, and HTN. Pt admitted with acute on chronic hypoxemic and hypercapnic respiratory failure.  12/14 - extubated  Pt reports improved appetite with last 6 meals charted as 0-100% completed (58% avg meal intake)  UOP: 1751m x24 hours  Medications: Scheduled Meds:  arformoterol  15 mcg Nebulization BID   atorvastatin  40 mg Oral Daily   budesonide (PULMICORT) nebulizer solution  0.5 mg Nebulization BID   Chlorhexidine Gluconate Cloth  6 each Topical Daily   docusate sodium  100 mg Oral BID   DULoxetine  40 mg Oral Daily   enoxaparin (LOVENOX) injection  100 mg Subcutaneous Q24H   furosemide  80 mg Intravenous BID   gabapentin  300 mg Oral TID   guaiFENesin  600 mg Oral BID   insulin aspart  0-20 Units Subcutaneous TID AC & HS   insulin aspart  5 Units Subcutaneous TID WC   mouth rinse  15 mL Mouth Rinse BID   polyethylene glycol  17 g Oral Daily   potassium chloride  40 mEq Oral BID   predniSONE  20 mg Oral Q breakfast   Followed by   [Derrill MemoON 03/17/2021] predniSONE  10 mg Oral Q breakfast   revefenacin  175 mcg Nebulization Daily   sodium chloride flush  10-40 mL Intracatheter Q12H   Labs: Recent Labs  Lab 03/09/21 0146 03/09/21 0913 03/14/21 0838 03/14/21 1847 03/15/21 0734  NA  --    < > 128* 123* 128*  K  --    < > 3.9 4.5 3.2*  CL  --    < > 82* 83* 84*  CO2  --    < > 34* 27 34*  BUN  --     < > 67* 66* 57*  CREATININE  --    < > 1.22 1.25* 1.06  CALCIUM  --    < > 9.4 9.1 9.3  MG 2.7*  --   --   --  2.6*  PHOS 5.1*  --   --   --   --   GLUCOSE  --    < > 226* 347* 194*   < > = values in this interval not displayed.  CBGs: 175-310 x24 hours  Diabetes coordinator following  Diet Order:   Diet Order             DIET DYS 3 Room service appropriate? Yes; Fluid consistency: Thin  Diet effective now                   EDUCATION NEEDS:   Not appropriate for education at this time  Skin:  Skin Assessment: Skin Integrity Issues: Skin Integrity Issues:: Other (Comment) Other: venous stasis ulcer to L ankle and R foot  Last BM:  12/21  Height:   Ht Readings from Last 1 Encounters:  03/04/21 _0  (1.956 m)  Weight:   Wt Readings from Last 1 Encounters:  03/13/21 (!) 203.1 kg    Ideal Body Weight:  94.5 kg  BMI:  Body mass index is 53.09 kg/m.  Estimated Nutritional Needs:   Kcal:  2000-2300  Protein:  150-200 gm  Fluid:  > 2 L     Jermiya Reichl A., MS, RD, LDN (she/her/hers) RD pager number and weekend/on-call pager number located in [place].

## 2021-03-15 NOTE — Plan of Care (Signed)

## 2021-03-16 LAB — BASIC METABOLIC PANEL
Anion gap: 12 (ref 5–15)
Anion gap: 14 (ref 5–15)
BUN: 45 mg/dL — ABNORMAL HIGH (ref 6–20)
BUN: 45 mg/dL — ABNORMAL HIGH (ref 6–20)
CO2: 31 mmol/L (ref 22–32)
CO2: 27 mmol/L (ref 22–32)
Calcium: 9.5 mg/dL (ref 8.9–10.3)
Calcium: 9 mg/dL (ref 8.9–10.3)
Chloride: 86 mmol/L — ABNORMAL LOW (ref 98–111)
Chloride: 84 mmol/L — ABNORMAL LOW (ref 98–111)
Creatinine, Ser: 0.9 mg/dL (ref 0.61–1.24)
Creatinine, Ser: 1.13 mg/dL (ref 0.61–1.24)
GFR, Estimated: >60 mL/min (ref >60)
GFR, Estimated: >60 mL/min (ref >60)
Glucose, Bld: 161 mg/dL — ABNORMAL HIGH (ref 70–99)
Glucose, Bld: 374 mg/dL — ABNORMAL HIGH (ref 70–99)
Potassium: 3.8 mmol/L (ref 3.5–5.1)
Potassium: 4.1 mmol/L (ref 3.5–5.1)
Sodium: 129 mmol/L — ABNORMAL LOW (ref 135–145)
Sodium: 125 mmol/L — ABNORMAL LOW (ref 135–145)

## 2021-03-16 LAB — GLUCOSE, CAPILLARY
Glucose-Capillary: 166 mg/dL — ABNORMAL HIGH (ref 70–99)
Glucose-Capillary: 177 mg/dL — ABNORMAL HIGH (ref 70–99)
Glucose-Capillary: 307 mg/dL — ABNORMAL HIGH (ref 70–99)
Glucose-Capillary: 344 mg/dL — ABNORMAL HIGH (ref 70–99)
Glucose-Capillary: 282 mg/dL — ABNORMAL HIGH (ref 70–99)

## 2021-03-16 MED ORDER — SENNOSIDES-DOCUSATE SODIUM 8.6-50 MG PO TABS
2.0000 | ORAL_TABLET | Freq: Two times a day (BID) | ORAL | Status: DC
  Administered 2021-03-16 – 2021-03-18 (×5): 2 via ORAL
  Filled 2021-03-16 (×5): qty 2

## 2021-03-16 MED ORDER — POLYETHYLENE GLYCOL 3350 17 G PO PACK
17.0000 g | PACK | Freq: Every day | ORAL | Status: DC
  Administered 2021-03-16 – 2021-03-17 (×2): 17 g via ORAL
  Filled 2021-03-16 (×2): qty 1

## 2021-03-16 NOTE — Progress Notes (Signed)
Physical Therapy Treatment Patient Details Name: Joe Carter MRN: [id number] DOB: 1980/10/09 Today's Date: 03/16/2021   History of Present Illness 40 yo admitted 12/11 with SOB and AMS with metabolic encephalopathy, cocaine (+), mixed respiratory failure with intubation 12/11-12/14. PMhx:obesity, DM, HFrEF, asthma    PT Comments    Pt tolerates treatment well despite not utilizing oxygen. Pt sats at 91-92% on room air at rest, and 90% on room air after ambulating. Pt will benefit from use of a bariatric rolling walker as this is wider and will allow the pt to maintain the device closer to his base of support and improve his posture. Pt is encouraged to continue frequently mobilizing to aide in improving activity tolerance. PT updates recommendations to outpatient PT at the time of discharge.   Recommendations for follow up therapy are one component of a multi-disciplinary discharge planning process, led by the attending physician.  Recommendations may be updated based on patient status, additional functional criteria and insurance authorization.  Follow Up Recommendations  Outpatient PT     Assistance Recommended at Discharge Intermittent Supervision/Assistance  Equipment Recommendations  Other (comment) (bariatric rolling walker)    Recommendations for Other Services       Precautions / Restrictions Precautions Precautions: Fall;Other (comment) Precaution Comments: SpO2>85%, obesity Restrictions Weight Bearing Restrictions: No     Mobility  Bed Mobility                    Transfers Overall transfer level: Needs assistance Equipment used: Rolling walker (2 wheels) Transfers: Sit to/from Stand Sit to Stand: Supervision                Ambulation/Gait Ambulation/Gait assistance: Supervision Gait Distance (Feet): 80 Feet (2 laps across room and back) Assistive device: Rolling walker (2 wheels) Gait Pattern/deviations: Step-through pattern Gait velocity:  reduced Gait velocity interpretation: <1.8 ft/sec, indicate of risk for recurrent falls   General Gait Details: pt with slowed step-through gait, widened BOS, mild increase in trunk flexion over walker   Stairs             Wheelchair Mobility    Modified Rankin (Stroke Patients Only)       Balance Overall balance assessment: Needs assistance Sitting-balance support: No upper extremity supported;Feet supported Sitting balance-Leahy Scale: Fair     Standing balance support: Reliant on assistive device for balance Standing balance-Leahy Scale: Poor                              Cognition Arousal/Alertness: Awake/alert Behavior During Therapy: WFL for tasks assessed/performed Overall Cognitive Status: Within Functional Limits for tasks assessed                                          Exercises      General Comments General comments (skin integrity, edema, etc.): upon PT arrival pt sitting in recliner with all lines including oxygen disconnected. At rest pt saturating 91-92% on room air. Pt ambulates on room air, denying SOB. Pt sats upon completion of ambulation at 90%. RN made awaren pt had taken oxygen off.      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[employer], Doctor, hospital football Progress towards PT goals: Progressing toward goals    Frequency    Min 3X/week      PT Plan Current plan remains appropriate    Co-evaluation              AM-PAC PT "6 Clicks" Mobility   Outcome Measure  Help needed turning from your back to your side while in a flat bed without  using bedrails?: A Little Help needed moving from lying on your back to sitting on the side of a flat bed without using bedrails?: A Little Help needed moving to and from a bed to a chair (including a wheelchair)?: A Little Help needed standing up from a chair using your arms (e.g., wheelchair or bedside chair)?: A Little Help needed to walk in hospital room?: A Little Help needed climbing 3-5 steps with a railing? : A Lot 6 Click Score: 17    End of Session   Activity Tolerance: Patient tolerated treatment well Patient left: in chair;with call bell/phone within reach Nurse Communication: Mobility status PT Visit Diagnosis: Other abnormalities of gait and mobility (R26.89);Difficulty in walking, not elsewhere classified (R26.2);Muscle weakness (generalized) (M62.81)     Time: 2419-9144 PT Time Calculation (min) (ACUTE ONLY): 13 min  Charges:  $Gait Training: 8-22 mins                     Zenaida Niece, PT, DPT Acute Rehabilitation Pager: [phone] Office Lovilia Gaynor [place] 03/16/2021, 2:43 PM

## 2021-03-16 NOTE — Plan of Care (Signed)
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
## 2021-03-16 NOTE — Progress Notes (Addendum)
Subjective: I seen [name] at bedside, he was lying in bed. He appears uncomfortable.  He states that he is uncomfortable and is experiencing abdominal pain.  He states that it started last night before lunch.  He describes it as a constant stabbing pain along the upper and lower abdomen.  He also endorses nausea, but has not vomited.  He states he is having difficulty having a bowel movement.  He states when he attempts he feels the urge but nothing comes out.  Objective:  Vital signs in last 24 hours: Vitals:   03/15/21 2005 03/15/21 2349 03/16/21 0100 03/16/21 0400  BP: (!) 132/92 (!) 139/96 138/88 139/85  Pulse: 99 85 81 82  Resp: _0 Temp: 98.4 F (36.9 C) 97.6 F (36.4 C)  98.3 F (36.8 C)  TempSrc: Oral Oral  Oral  SpO2: 90% 98% 96% 98%  Weight:      Height:       BMP Latest Ref Rng & Units 03/15/2021 03/15/2021 03/14/2021  Glucose 70 - 99 mg/dL 262(H) 194(H) 347(H)  BUN 6 - 20 mg/dL 49(H) 57(H) 66(H)  Creatinine 0.61 - 1.24 mg/dL 1.08 1.06 1.25(H)  BUN/Creat Ratio 9 - 20 - - -  Sodium 135 - 145 mmol/L 125(L) 128(L) 123(L)  Potassium 3.5 - 5.1 mmol/L 4.0 3.2(L) 4.5  Chloride 98 - 111 mmol/L 84(L) 84(L) 83(L)  CO2 22 - 32 mmol/L 28 34(H) 27  Calcium 8.9 - 10.3 mg/dL 9.0 9.3 9.1   CBC Latest Ref Rng & Units 03/15/2021 03/14/2021 03/13/2021  WBC 4.0 - 10.5 K/uL 16.0(H) 16.3(H) 17.2(H)  Hemoglobin 13.0 - 17.0 g/dL 16.8 19.1(H) 18.9(H)  Hematocrit 39.0 - 52.0 % 52.3(H) 57.4(H) 57.4(H)  Platelets 150 - 400 K/uL 275 238 295   Physical Exam Constitutional:      Appearance: He is morbidly obese.     Interventions: Nasal cannula in place.     Comments: Pt appears to be uncomfortable  HENT:     Head: Normocephalic and atraumatic.  Cardiovascular:     Rate and Rhythm: Normal rate.     Comments: Difficult to auscultate heart sounds due to large body habitus Pulmonary:     Effort: Pulmonary effort is normal.     Breath sounds: Normal air entry.     Comments:  Difficult to auscultate lung sounds due to large body habitus; unable to appreciate rales. Abdominal:     General: Abdomen is flat.     Palpations: Abdomen is rigid.     Tenderness: There is abdominal tenderness in the right upper quadrant, right lower quadrant, epigastric area, left upper quadrant and left lower quadrant.     Comments: Unable to appreciate bowel sounds due to large body habitus  Skin:    General: Skin is warm and dry.  Neurological:     General: No focal deficit present.     Mental Status: He is alert and oriented to person, place, and time.  Psychiatric:        Behavior: Behavior is cooperative.     Assessment/Plan:  Principal Problem:   Acute respiratory failure with hypoxia (HCC) Active Problems:   Morbid obesity with BMI of 60.0-69.9, adult (HCC)   Diastolic heart failure (HCC)   Obesity hypoventilation syndrome (HCC)   Diabetes mellitus ([place])  Multi-Etiological Acute on Chronic Hypoxic and Hypercapnic Respiratory Failure likely 2/2 to AoC HFpEF, OHS/OSA, Asthma Exacerbation Afebrile overnight; still requiring HFNC at 8L improved from previous day. Pt  continues  to be diuresed, output 3.5L overnight. Lungs difficult to auscultate due to large body habitus, however did not appreciate crackles.  --Brovana 15 MCG twice daily - Pulmicort 0.5 mg twice daily - Albuterol every 4 hours as needed - Yupelri 175 MCG daily - Continue prednisone taper, 20 mg within end date of 12/23, 10 mg 12/24-12/27 - Mucinex 600 mg twice daily - Continue IV Lasix 80 mg twice daily - Trend BMP replenish potassium to greater than 4 and magnesium greater than 2 in the setting of HF - Nightly BiPAP  #Constipation Pt c/o abd pain with associated nausea that began yesterday afternoon. He endorses tenesmus and has not had a bowel movement in 2 days. Pt received a dose of colace last night without relief. CT angio of the chest 03/13/21 captured the upper abd, findings unremarkable. Abd  discomfort likely associated with constipation. Will order bowel regimen, if pain persists, will consider portal abd x-ray if suspecting ileus.  --Senokot 2 tablets BID and Miralax  #Hyponatremia Pt has been chronically hyponatremic over the last several days. Pt is asymptomatic. Pt is currently being diuresed, likely the source of his electrolyte imbalance. Cr levels has trended back to normal limits. --Continue to monitor for symptoms --Trend BMP  Hx of DM: Home medication include: Wilder Glade; last A1c was 6.8% (02/2021) currently on a steroid taper. CBG has been acceptable <170 in the setting of steroid use. Will continue to observe and make adjustments as needed. - Resistant SSI - NovoLog 5 units mealtime  Neuropathy: - Continue gabapentin 300 mg 3 times daily - Continue duloxetine 20 mg daily   HLD - Atorvastatin 40 mg daily  Prior to Admission Living Arrangement: Anticipated Discharge Location: Barriers to Discharge: Dispo: Anticipated discharge in approximately 1-3 day(s).   Timothy Lasso, MD 03/16/2021, 6:12 AM Pager: [phone] After 5pm on weekdays and 1pm on weekends: On Call pager [phone]

## 2021-03-16 NOTE — Progress Notes (Signed)
Pt placed on bipap for the night.

## 2021-03-16 NOTE — Progress Notes (Signed)
Upon arrival to pt room, bipap mask off pt but machine still running/alarming. Pt with HFNC at 10L on at this time. Bipap placed into standby and HFNC weaned to 8L at this time. No distress noted at this time. Pt declines scheduled nebs at this time but endorses abdominal pain. RT will continue to monitor and be available as needed.

## 2021-03-17 LAB — URINALYSIS, MICROSCOPIC (REFLEX): RBC / HPF: >50 RBC/hpf (ref 0–5)

## 2021-03-17 LAB — CBC
HCT: 48.6 % (ref 39.0–52.0)
Hemoglobin: 16.1 g/dL (ref 13.0–17.0)
MCH: 30.8 pg (ref 26.0–34.0)
MCHC: 33.1 g/dL (ref 30.0–36.0)
MCV: 92.9 fL (ref 80.0–100.0)
Platelets: 285 K/uL (ref 150–400)
RBC: 5.23 MIL/uL (ref 4.22–5.81)
RDW: 13.5 % (ref 11.5–15.5)
WBC: 14.3 K/uL — ABNORMAL HIGH (ref 4.0–10.5)
nRBC: 0 % (ref 0.0–0.2)

## 2021-03-17 LAB — URINALYSIS, ROUTINE W REFLEX MICROSCOPIC
Bilirubin Urine: NEGATIVE
Glucose, UA: 250 mg/dL — AB
Ketones, ur: NEGATIVE mg/dL
Nitrite: NEGATIVE
Protein, ur: NEGATIVE mg/dL
Specific Gravity, Urine: 1.01 (ref 1.005–1.030)
pH: 6 (ref 5.0–8.0)

## 2021-03-17 LAB — BASIC METABOLIC PANEL WITH GFR
Anion gap: 13 (ref 5–15)
BUN: 40 mg/dL — ABNORMAL HIGH (ref 6–20)
CO2: 30 mmol/L (ref 22–32)
Calcium: 9.4 mg/dL (ref 8.9–10.3)
Chloride: 84 mmol/L — ABNORMAL LOW (ref 98–111)
Creatinine, Ser: 1.07 mg/dL (ref 0.61–1.24)
GFR, Estimated: >60 mL/min
Glucose, Bld: 179 mg/dL — ABNORMAL HIGH (ref 70–99)
Potassium: 3.8 mmol/L (ref 3.5–5.1)
Sodium: 127 mmol/L — ABNORMAL LOW (ref 135–145)

## 2021-03-17 LAB — GLUCOSE, CAPILLARY
Glucose-Capillary: 222 mg/dL — ABNORMAL HIGH (ref 70–99)
Glucose-Capillary: 316 mg/dL — ABNORMAL HIGH (ref 70–99)
Glucose-Capillary: 188 mg/dL — ABNORMAL HIGH (ref 70–99)
Glucose-Capillary: 167 mg/dL — ABNORMAL HIGH (ref 70–99)

## 2021-03-17 MED ORDER — FUROSEMIDE 40 MG PO TABS
80.0000 mg | ORAL_TABLET | Freq: Every day | ORAL | Status: DC
  Administered 2021-03-17 – 2021-03-18 (×2): 80 mg via ORAL
  Filled 2021-03-17 (×2): qty 2

## 2021-03-17 MED ORDER — POTASSIUM CHLORIDE CRYS ER 20 MEQ PO TBCR
40.0000 meq | EXTENDED_RELEASE_TABLET | Freq: Once | ORAL | Status: AC
Start: 2021-03-17 — End: 2021-03-17
  Administered 2021-03-17: 11:00:00 40 meq via ORAL
  Filled 2021-03-17: qty 2

## 2021-03-17 MED ORDER — INSULIN ASPART 100 UNIT/ML IJ SOLN
10.0000 [IU] | Freq: Three times a day (TID) | INTRAMUSCULAR | Status: DC
  Administered 2021-03-17 (×2): 10 [IU] via SUBCUTANEOUS

## 2021-03-17 NOTE — Progress Notes (Signed)
Subjective: I seen Mr. Caradine at bedside, he was lying in bed.  He states that he is breathing better.  Denies any shortness of breath or chest pain.  He still endorsed some abdominal pain.  However, stated that he had a bowel movement yesterday without straining.  He went on to say, after dinner yesterday the abdominal pain resumed.  He also noted 2 episodes of vomiting overnight.  Patient states still endorses tenesmus.  Patient denies any urinary changes.  Objective:  Vital signs in last 24 hours: Vitals:   03/17/21 0330 03/17/21 0726 03/17/21 0820 03/17/21 1205  BP: (!) 143/100 109/74    Pulse: 98  84   Resp: 18  14   Temp: 98.6 F (37 C) 98 F (36.7 C)  98.3 F (36.8 C)  TempSrc: Oral Oral  Oral  SpO2: 100%  98%   Weight:      Height:       BMP Latest Ref Rng & Units 03/17/2021 03/16/2021 03/16/2021  Glucose 70 - 99 mg/dL 179(H) 374(H) 161(H)  BUN 6 - 20 mg/dL 40(H) 45(H) 45(H)  Creatinine 0.61 - 1.24 mg/dL 1.07 1.13 0.90  BUN/Creat Ratio 9 - 20 - - -  Sodium 135 - 145 mmol/L 127(L) 125(L) 129(L)  Potassium 3.5 - 5.1 mmol/L 3.8 4.1 3.8  Chloride 98 - 111 mmol/L 84(L) 84(L) 86(L)  CO2 22 - 32 mmol/L _0 Calcium 8.9 - 10.3 mg/dL 9.4 9.0 9.5   CBC Latest Ref Rng & Units 03/17/2021 03/15/2021 03/14/2021  WBC 4.0 - 10.5 K/uL 14.3(H) 16.0(H) 16.3(H)  Hemoglobin 13.0 - 17.0 g/dL 16.1 16.8 19.1(H)  Hematocrit 39.0 - 52.0 % 48.6 52.3(H) 57.4(H)  Platelets 150 - 400 K/uL 285 275 238   Physical Exam Constitutional:      Appearance: He is morbidly obese.     Interventions: Nasal cannula in place.     Comments: Pt appears to be uncomfortable  HENT:     Head: Normocephalic and atraumatic.  Cardiovascular:     Rate and Rhythm: Normal rate and regular rhythm.     Heart sounds: Heart sounds are distant.     Comments: Difficult to auscultate heart sounds due to large body habitus Pulmonary:     Effort: Pulmonary effort is normal.     Breath sounds: Normal air entry.      Comments: Difficult to auscultate lung sounds due to large body habitus; unable to appreciate rales. Abdominal:     General: Abdomen is protuberant. Bowel sounds are normal.     Palpations: Abdomen is soft.     Tenderness: There is abdominal tenderness.  Genitourinary:    Comments: Foley catheter present; pink-tinged urine noted in Foley. Skin:    General: Skin is warm and dry.  Neurological:     General: No focal deficit present.     Mental Status: He is alert and oriented to person, place, and time.  Psychiatric:        Behavior: Behavior is cooperative.     Assessment/Plan:  Principal Problem:   Acute respiratory failure with hypoxia (HCC) Active Problems:   Morbid obesity with BMI of 60.0-69.9, adult (HCC)   Diastolic heart failure (HCC)   Obesity hypoventilation syndrome (HCC)   Diabetes mellitus ([place])  Multi-Etiological Acute on Chronic Hypoxic and Hypercapnic Respiratory Failure likely 2/2 to AoC HFpEF, OHS/OSA, Asthma Exacerbation Afebrile overnight; still requiring HFNC at 7L improved from previous day, although turned off oxygen while evaluating the patient and he  saturated well. Pt continues to be diuresed, output 1.3L overnight, net -36.6L since admission. Lungs difficult to auscultate due to large body habitus, however did not appreciate crackles.  --Brovana 15 MCG twice daily - Pulmicort 0.5 mg twice daily - Albuterol every 4 hours as needed - Yupelri 175 MCG daily - Continue prednisone taper 10 mg 12/24-12/27 - Mucinex 600 mg twice daily - Lasix 80 mg oral daily  - Trend BMP replenish potassium to greater than 4 and magnesium greater than 2 in the setting of HF - Nightly BiPAP - Ambulatory pulse ox   #Constipation Pt c/o abd pain with associated nausea that began 2-3 days ago. Last bowel movement was yesterday after receiving senna and miralax, although patient still feels constipated. Abd discomfort likely associated with constipation. Will continue bowel  regimen.  --Senokot 2 tablets BID and Miralax  #Hypervolemic Hyponatremia Pt has been chronically hyponatremic over the last several days, improved from 125 to 127 today. Pt is asymptomatic. Pt is currently being diuresed, likely the source of his electrolyte imbalance is his volume overload. Cr levels has trended back to normal limits. --Continue to monitor for symptoms --Trend BMP  Hx of DM: Home medication include: Wilder Glade; last A1c was 6.8% (02/2021) currently on a steroid taper. CBG have been elevated ranging from 179-316 in the last 24hrs. Will continue to observe and make adjustments as needed. - Resistant SSI - Increase novoLog to 10 units mealtime  Neuropathy: - Continue gabapentin 300 mg 3 times daily - Continue duloxetine 20 mg daily   HLD - Atorvastatin 40 mg daily  Hematuria Patient noted to have pink-tinged urine in Foley.  Patient denies any urinary complaints.  Patient is afebrile.  And does endorse abdominal pain which is likely attributed to constipation.  However, can exist if patient has urinary infection.  On physical exam suprapubic area did not appear distended. --UA obtained --Remove Foley with PVR    Prior to Admission Living Arrangement: Anticipated Discharge Location: Barriers to Discharge: Dispo: Anticipated discharge in approximately 1-3 day(s).   Timothy Lasso, MD 03/17/2021, 12:33 PM Pager: [phone] After 5pm on weekdays and 1pm on weekends: On Call pager [phone]
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
## 2021-03-17 NOTE — Progress Notes (Signed)
SATURATION QUALIFICATIONS: (This note is used to comply with regulatory documentation for home oxygen)  Patient Saturations on Room Air at Rest = 93 - 94%  Patient Saturations on Room Air while Ambulating = 94 - 97%  Patient Saturations on 2 Liters of oxygen while Ambulating = 100%  Please briefly explain why patient needs home oxygen:

## 2021-03-18 LAB — BASIC METABOLIC PANEL
Anion gap: 13 (ref 5–15)
BUN: 32 mg/dL — ABNORMAL HIGH (ref 6–20)
CO2: 32 mmol/L (ref 22–32)
Calcium: 9.6 mg/dL (ref 8.9–10.3)
Chloride: 85 mmol/L — ABNORMAL LOW (ref 98–111)
Creatinine, Ser: 1.01 mg/dL (ref 0.61–1.24)
GFR, Estimated: >60 mL/min (ref >60)
Glucose, Bld: 165 mg/dL — ABNORMAL HIGH (ref 70–99)
Potassium: 3.8 mmol/L (ref 3.5–5.1)
Sodium: 130 mmol/L — ABNORMAL LOW (ref 135–145)

## 2021-03-18 LAB — GLUCOSE, CAPILLARY
Glucose-Capillary: 163 mg/dL — ABNORMAL HIGH (ref 70–99)
Glucose-Capillary: 177 mg/dL — ABNORMAL HIGH (ref 70–99)

## 2021-03-18 LAB — CBC
HCT: 48.9 % (ref 39.0–52.0)
Hemoglobin: 15.9 g/dL (ref 13.0–17.0)
MCH: 30.3 pg (ref 26.0–34.0)
MCHC: 32.5 g/dL (ref 30.0–36.0)
MCV: 93.3 fL (ref 80.0–100.0)
Platelets: 235 10*3/uL (ref 150–400)
RBC: 5.24 MIL/uL (ref 4.22–5.81)
RDW: 13.6 % (ref 11.5–15.5)
WBC: 12.4 10*3/uL — ABNORMAL HIGH (ref 4.0–10.5)
nRBC: 0 % (ref 0.0–0.2)

## 2021-03-18 MED ORDER — ALBUTEROL SULFATE (2.5 MG/3ML) 0.083% IN NEBU: 2.5000 mg | INHALATION_SOLUTION | RESPIRATORY_TRACT | 12 refills | Status: AC | PRN

## 2021-03-18 MED ORDER — BUDESONIDE 0.5 MG/2ML IN SUSP: 0.5000 mg | Freq: Two times a day (BID) | RESPIRATORY_TRACT | 0 refills | Status: AC

## 2021-03-18 MED ORDER — ATORVASTATIN CALCIUM 40 MG PO TABS: 40.0000 mg | ORAL_TABLET | Freq: Every day | ORAL | 0 refills | Status: DC

## 2021-03-18 MED ORDER — PREDNISONE 10 MG PO TABS: 10.0000 mg | ORAL_TABLET | Freq: Every day | ORAL | 0 refills | Status: AC

## 2021-03-18 MED ORDER — ARFORMOTEROL TARTRATE 15 MCG/2ML IN NEBU: 15.0000 ug | INHALATION_SOLUTION | Freq: Two times a day (BID) | RESPIRATORY_TRACT | 0 refills | Status: AC

## 2021-03-18 MED ORDER — REVEFENACIN 175 MCG/3ML IN SOLN: 175.0000 ug | Freq: Every day | RESPIRATORY_TRACT | 0 refills | Status: AC

## 2021-03-18 NOTE — TOC Transition Note (Signed)
Transition of Care [facility]) - CM/SW Discharge Note   Patient Details  Name: Joe Carter MRN: [id number] Date of Birth: 1980-08-15  [facility] CM/SW Contact:  Bartholomew Crews, RN Phone Number: [phone] 03/18/2021, 2:27 PM   Clinical Narrative:     Patient requesting bariatric RW. Has not met deductible for insurance and had out of pocket cost $215. Patient declined RW at this time.   Final next level of care: Home/Self Care Barriers to Discharge: No Barriers Identified   Patient Goals and CMS Choice Patient states their goals for this hospitalization and ongoing recovery are:: return home with family CMS Medicare.gov Compare Post Acute Care list provided to:: Patient Choice offered to / list presented to : Patient  Discharge Placement                       Discharge Plan and Services   Discharge Planning Services: CM Consult            DME Arranged: Oxygen DME Agency: [employer] Date DME Agency Contacted: 03/18/21 Time DME Agency Contacted: [phone] Representative spoke with at DME Agency: [place]: NA [place] Agency: NA        Social Determinants of Health ([place]) Interventions     Readmission Risk Interventions No flowsheet data found.

## 2021-03-18 NOTE — Plan of Care (Signed)

## 2021-03-18 NOTE — Progress Notes (Signed)
Pt refused ambulation to  check need of O2 for for home upon D/C. Informed provider assigned to pt via phone.

## 2021-03-18 NOTE — Plan of Care (Signed)
Problem: Education: Goal: Knowledge of General Education information will improve Description: Including pain rating scale, medication(s)/side effects and non-pharmacologic comfort measures 03/18/2021 1153 by Morene Rankins, LPN Outcome: Adequate for Discharge 03/18/2021 0809 by Morene Rankins, LPN Outcome: Progressing   Problem: Health Behavior/Discharge Planning: Goal: Ability to manage health-related needs will improve 03/18/2021 1153 by Morene Rankins, LPN Outcome: Adequate for Discharge 03/18/2021 0809 by Morene Rankins, LPN Outcome: Progressing   Problem: Clinical Measurements: Goal: Ability to maintain clinical measurements within normal limits will improve 03/18/2021 1153 by Morene Rankins, LPN Outcome: Adequate for Discharge 03/18/2021 0809 by Morene Rankins, LPN Outcome: Progressing Goal: Will remain free from infection 03/18/2021 1153 by Morene Rankins, LPN Outcome: Adequate for Discharge 03/18/2021 0809 by Morene Rankins, LPN Outcome: Progressing Goal: Diagnostic test results will improve 03/18/2021 1153 by Morene Rankins, LPN Outcome: Adequate for Discharge 03/18/2021 0809 by Morene Rankins, LPN Outcome: Progressing Goal: Respiratory complications will improve 03/18/2021 1153 by Morene Rankins, LPN Outcome: Adequate for Discharge 03/18/2021 0809 by Morene Rankins, LPN Outcome: Progressing Goal: Cardiovascular complication will be avoided 03/18/2021 1153 by Morene Rankins, LPN Outcome: Adequate for Discharge 03/18/2021 0809 by Morene Rankins, LPN Outcome: Progressing   Problem: Activity: Goal: Risk for activity intolerance will decrease 03/18/2021 1153 by Morene Rankins, LPN Outcome: Adequate for Discharge 03/18/2021 0809 by Morene Rankins, LPN Outcome: Progressing   Problem: Nutrition: Goal: Adequate nutrition will be maintained 03/18/2021 1153 by Morene Rankins, LPN Outcome: Adequate for Discharge 03/18/2021 0809  by Morene Rankins, LPN Outcome: Progressing   Problem: Coping: Goal: Level of anxiety will decrease 03/18/2021 1153 by Morene Rankins, LPN Outcome: Adequate for Discharge 03/18/2021 0809 by Morene Rankins, LPN Outcome: Progressing   Problem: Elimination: Goal: Will not experience complications related to bowel motility 03/18/2021 1153 by Morene Rankins, LPN Outcome: Adequate for Discharge 03/18/2021 0809 by Morene Rankins, LPN Outcome: Progressing Goal: Will not experience complications related to urinary retention 03/18/2021 1153 by Morene Rankins, LPN Outcome: Adequate for Discharge 03/18/2021 0809 by Morene Rankins, LPN Outcome: Progressing   Problem: Pain Managment: Goal: General experience of comfort will improve 03/18/2021 1153 by Morene Rankins, LPN Outcome: Adequate for Discharge 03/18/2021 0809 by Morene Rankins, LPN Outcome: Progressing   Problem: Safety: Goal: Ability to remain free from injury will improve 03/18/2021 1153 by Morene Rankins, LPN Outcome: Adequate for Discharge 03/18/2021 0809 by Morene Rankins, LPN Outcome: Progressing   Problem: Skin Integrity: Goal: Risk for impaired skin integrity will decrease 03/18/2021 1153 by Morene Rankins, LPN Outcome: Adequate for Discharge 03/18/2021 0809 by Morene Rankins, LPN Outcome: Progressing   Problem: Safety: Goal: Non-violent Restraint(s) 03/18/2021 1153 by Morene Rankins, LPN Outcome: Adequate for Discharge 03/18/2021 0809 by Morene Rankins, LPN Outcome: Progressing   Problem: Acute Rehab PT Goals(only PT should resolve) Goal: Pt Will Go Supine/Side To Sit Outcome: Adequate for Discharge Goal: Patient Will Transfer Sit To/From Stand Outcome: Adequate for Discharge Goal: Pt Will Transfer Bed To Chair/Chair To Bed Outcome: Adequate for Discharge Goal: Pt Will Ambulate Outcome: Adequate for Discharge Goal: Pt/caregiver will Perform Home Exercise Program Outcome:  Adequate for Discharge   Problem: Acute Rehab OT Goals (only OT should resolve) Goal: Pt. Will Perform Grooming Outcome: Adequate for Discharge Goal: Pt. Will Perform Upper Body Bathing Outcome: Adequate for Discharge Goal: Pt. Will Perform Lower Body Bathing Outcome: Adequate for Discharge  Goal: Pt. Will Transfer To Toilet Outcome: Adequate for Discharge Goal: Pt. Will Perform Toileting-Clothing Manipulation Outcome: Adequate for Discharge

## 2021-03-18 NOTE — Discharge Summary (Signed)
Name: Joe Carter MRN: [id number] DOB: 12-Oct-1980 40 y.o. PCP: Mike Craze, DO  Date of Admission: 03/04/2021  7:50 AM Date of Discharge: 03/18/21 Attending Physician: Angelica Pou, MD  Discharge Diagnosis: 1. 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  Hypervolemic hyponatremia 3.  Constipation 4.  Type 2 diabetes 5.  Neuropathy 6.  HLD  Discharge Medications: Allergies as of 03/18/2021   No Known Allergies      Medication List     TAKE these medications    Advair HFA 115-21 MCG/ACT inhaler Generic drug: fluticasone-salmeterol INHALE 2 PUFFS INTO THE LUNGS TWICE A DAY   albuterol (2.5 MG/3ML) 0.083% nebulizer solution Commonly known as: PROVENTIL Take 3 mLs (2.5 mg total) by nebulization every 4 (four) hours as needed for wheezing or shortness of breath.   arformoterol 15 MCG/2ML Nebu Commonly known as: BROVANA Take 2 mLs (15 mcg total) by nebulization 2 (two) times daily.   atorvastatin 40 MG tablet Commonly known as: Lipitor Take 1 tablet (40 mg total) by mouth daily.   budesonide 0.5 MG/2ML nebulizer solution Commonly known as: PULMICORT Take 2 mLs (0.5 mg total) by nebulization 2 (two) times daily.   dapagliflozin propanediol 10 MG Tabs tablet Commonly known as: FARXIGA Take 1 tablet (10 mg total) by mouth daily.   DULoxetine 30 MG capsule Commonly known as: CYMBALTA TAKE 1 CAPSULE BY MOUTH EVERY DAY   furosemide 80 MG tablet Commonly known as: LASIX TAKE 1 TABLET BY MOUTH EVERY DAY   gabapentin 300 MG capsule Commonly known as: NEURONTIN Take 1 capsule (300 mg total) by mouth 3 (three) times daily.   predniSONE 10 MG tablet Commonly known as: DELTASONE Take 1 tablet (10 mg total) by mouth daily with breakfast for 2 days.   revefenacin 175 MCG/3ML nebulizer solution Commonly known as: YUPELRI Take 3 mLs (175 mcg total) by nebulization  daily. Start taking on: March 19, 2021               Discharge Care Instructions  (From admission, onward)           Start     Ordered   03/18/21 0000  Discharge wound care:       Comments: Continue to apply moisturizer to lower extremities daily.  Follow-up with wound care if needed.   03/18/21 1135            Disposition and follow-up:   Joe Carter was discharged from [facility] in Stable condition.  At the hospital follow up visit please address:  1.  Patient on 3 L of oxygen at home = patient sent home with oxygen tanks and that company will visit patient at home tomorrow to ensure his portable tanks are full.  Pt inhalers were also refilled. Pt was sating well on RA prior to DC. Revaluate use of inhalers and oxygen requirement needs at [place]. Patient is to follow-up with [facility] clinic.  2.  Labs / imaging needed at time of follow-up: BMP; ambulatory pul ox; referral for outpatient physical therapy  3.  Pending labs/ test needing follow-up: None  Follow-up Appointments:  Follow-up [place] Follow up on 05/08/2021.   Why: 9:30 am with Dr. Joya Gaskins. Contact information: [place] [address] [place]. Call.   Why: find  out about benefits Contact information: [place] @  [facility]: CONTACT FOR D/C PLANNING NEEDS.PHONE # [phone]                Hospital Course by problem list: Joe Carter is a 40 year old male with a history HFpEF, asthma, and morbid obesity who initially presented on the 11th for respiratory failure, in the setting of being out of medications for his chronic conditions several months and smoking crack with possible opiate contamination, which required vent support, and he successfully extubated to BiPAP on the 12/14. He has received Rocephin for CAP coverage, Steroids, and IV Lasix.  Intracardiac and  intrapulmonary shunting has been ruled out via CT angio chest.  He is approximately -16 L on Lasix. Tolerating high flow oxygen and was transferred to progressive on 03/15/2021.  Joe Carter, 03/15/2021 to 03/18/2021; patient was initially on high flow nasal cannula at 15 L and oxygen requirement continued to improve.  Today patient satting above 88% on room air.  He denies any shortness of breath.  No increased work of breathing noted.  Patient used BiPAP machine nightly.  Patient continued to be diuresed and net -37 L since admission.  Patient was transitioned from IV Lasix to oral Lasix after adequate diuresis.  Patient was chronically hyponatremic likely secondary to hypervolemia.  Patient was asymptomatic.  BMP obtained prior to discharge shows improvement in sodium levels (127-->130).  Patient did develop some mild abdominal pain secondary to constipation which has since resolved and bowel regiment.  Foley was removed yesterday, patient was noted to have mild hematuria.  He denied any urinary complaints.  UA significant for RBCs but no infection.  PVR = 0.  All other chronic conditions such as diabetes, neuropathy, HLD were well controlled. Patient was stable and clinically adequate for discharge.   Discharge Exam:   BP 113/71 (BP Location: Left Wrist)    Pulse 88    Temp 97.9 F (36.6 C) (Oral)    Resp 17    Ht _0  (1.956 m)    Wt (!) 203.1 kg    SpO2 91%    BMI 53.09 kg/m  Discharge exam: Physical Exam Constitutional:      General: He is awake.  HENT:     Head: Normocephalic and atraumatic.  Cardiovascular:     Rate and Rhythm: Normal rate and regular rhythm.     Heart sounds: Normal heart sounds.  Pulmonary:     Effort: Pulmonary effort is normal.     Breath sounds: Normal breath sounds and air entry.  Abdominal:     General: Abdomen is protuberant.     Palpations: Abdomen is soft.     Tenderness: There is no abdominal tenderness.  Skin:    General: Skin is  warm and dry.  Neurological:     General: No focal deficit present.     Mental Status: He is alert.  Psychiatric:        Behavior: Behavior normal. Behavior is cooperative.     Pertinent Labs, Studies, and Procedures:  DG Chest 1 View  Result Date: 03/05/2021 CLINICAL DATA:  40 year old male with history of intubation. EXAM: CHEST  1 VIEW COMPARISON:  03/04/2021 FINDINGS: Similar position of indwelling endotracheal tube with the tip in the midthoracic trachea. Unchanged position of left IJ central venous catheter with the catheter tip in the superior vena cava. Gastric decompression tube distal tip terminates off the inferior aspect of this image. Unchanged mild cardiomegaly. Similar appearance  of scattered hazy and streaky opacities with a basilar predominance. No pneumothorax or significant pleural effusion. IMPRESSION: 1. Unchanged and appropriate position of indwelling support lines and tubes, as described. 2. Persistent, similar appearing basal predominant hazy and streaky opacities, favored to represent atelectasis however developing infectious/inflammatory etiology could appear similarly. Electronically Signed   By: Ruthann Cancer M.D.   On: 03/05/2021 08:09   DG CHEST PORT 1 VIEW  Result Date: 03/04/2021 CLINICAL DATA:  Encounter for central line placement. EXAM: PORTABLE CHEST 1 VIEW COMPARISON:  Chest radiograph performed earlier on the same date FINDINGS: The heart is enlarged. Bilateral lower lobe hazy opacities which may represent atelectasis with small bilateral pleural effusions. Underlying airspace disease can not be excluded. Left IJ access central line with distal tip in the SVC. IMPRESSION: 1.  Left IJ access central line with distal tip in the SVC. 2.  No other significant interval change. Electronically Signed   By: Keane Police D.O.   On: 03/04/2021 17:05   DG Chest Portable 1 View  Result Date: 03/04/2021 CLINICAL DATA:  Endotracheal tube verification. EXAM: PORTABLE  CHEST 1 VIEW COMPARISON:  Chest radiograph performed earlier on the same date FINDINGS: The heart is enlarged.  Low lung volumes with bibasilar atelectasis. Endotracheal tube with distal tip at the level of the clavicular heads. NG tube coursing below the diaphragm with distal tip not included. Left IJ access central line with distal tip in the left IJ or at the junction of the left IJ and subclavian vein. IMPRESSION: 1.  Lines and tubes as above. 2.  Low lung volumes with bibasilar atelectasis. Electronically Signed   By: Keane Police D.O.   On: 03/04/2021 15:09   DG Chest Port 1 View  Result Date: 03/04/2021 CLINICAL DATA:  Shortness of breath.  Respiratory distress. EXAM: PORTABLE CHEST 1 VIEW COMPARISON:  Chest XR, 12/22/2020.  CT chest, 07/06/2020. FINDINGS: Cardiomediastinal silhouette is within normal limits given technique and degree of inflation. Hypoinflation. Trace LEFT basilar streaky opacities, likely atelectasis. Lungs are well inflated. No pleural effusion or pneumothorax. No acute displaced fracture. IMPRESSION: 1. Hypoinflation with trace LEFT basilar atelectasis. 2. No acute superimposed cardiopulmonary process. Electronically Signed   By: Michaelle Birks M.D.   On: 03/04/2021 08:27   DG Abd Portable 1 View  Result Date: 03/04/2021 CLINICAL DATA:  Enteric catheter placement EXAM: PORTABLE ABDOMEN - 1 VIEW COMPARISON:  03/04/2021 FINDINGS: Frontal view of the lower chest and upper abdomen demonstrates enteric catheter tip and side port projecting over the gastric body. Bowel gas pattern is unremarkable. Increased patchy consolidation at the lung bases may reflect progressive atelectasis. No acute bony abnormality. IMPRESSION: 1. Enteric catheter projecting over gastric body. Electronically Signed   By: Randa Ngo M.D.   On: 03/04/2021 15:06   CT ANGIO CHEST AORTA W/CM &/OR WO/CM  Result Date: 03/13/2021 CLINICAL DATA:  Pulmonary AVM. EXAM: CT ANGIOGRAPHY CHEST WITH CONTRAST  TECHNIQUE: Multidetector CT imaging of the chest was performed using the standard protocol during bolus administration of intravenous contrast. Multiplanar CT image reconstructions and MIPs were obtained to evaluate the vascular anatomy. CONTRAST:  171m OMNIPAQUE IOHEXOL 350 MG/ML SOLN COMPARISON:  CT a 07/07/2020 6 FINDINGS: Cardiovascular: Study time to opacified the aorta. There is equal opacification of the aorta and pulmonary arteries. No acute findings aorta great vessels. No evidence of pulmonary embolism. No evidence of pulmonary AVM. Exam not timed to opacified the pulmonary arteries. Mediastinum/Nodes: No axillary or supraclavicular adenopathy. No mediastinal or  hilar adenopathy. No pericardial fluid. Esophagus normal. Lungs/Pleura: There is dense bibasilar consolidation atelectasis. Patchy nodularity in the upper lobes. Ground-glass opacity in the upper lobes. Upper Abdomen: Limited view of the liver, kidneys, pancreas are unremarkable. Normal adrenal glands. Musculoskeletal: No aggressive osseous lesion. Review of the MIP images confirms the above findings. IMPRESSION: 1. Bibasilar consolidation suggest multifocal pneumonia versus aspiration pneumonitis. 2. Patchy airspace in the upper lobes with ground-glass opacities suggest multifocal infection and potential superimposed mild edema. 3. No evidence of pulmonary AVM. The study is not timed to assess the pulmonary arteries. No evidence of pulmonary AVM on comparison CTPA 07/06/2020. Electronically Signed   By: Suzy Bouchard M.D.   On: 03/13/2021 19:23   ECHOCARDIOGRAM LIMITED BUBBLE STUDY  Result Date: 03/04/2021    ECHOCARDIOGRAM LIMITED REPORT   Patient Name:   Joe Carter Date of Exam: 03/04/2021 Medical Rec #:  [id number]      Height:       77.0 in Accession #:    [id number]     Weight:       486.1 lb Date of Birth:  [birth year]      BSA:          3.263 m Patient Age:    36 years       BP:           105/58 mmHg Patient Gender: M               HR:           73 bpm. Exam Location:  Inpatient Procedure: Limited Echo, Intracardiac Opacification Agent and Saline Contrast            Bubble Study STAT ECHO Indications:    Respiratory failure  History:        Patient has prior history of Echocardiogram examinations, most                 recent 07/10/2020. Arrythmias:Tachycardia; Risk Factors:Morbid                 obesity. Respiratory failure requiring mechanical ventilation.  Sonographer:    Merrie Roof RDCS Referring Phys: [id number] Candee Furbish  Sonographer Comments: Technically challenging study due to limited acoustic windows, Technically difficult study due to poor echo windows, echo performed with patient supine and on artificial respirator and patient is morbidly obese. IMPRESSIONS  1. Very difficult study. Bubble study appears to be negative. Global LV/RV function is normal.  2. Left ventricular ejection fraction, by estimation, is 60 to 65%. The left ventricle has normal function. The left ventricle has no regional wall motion abnormalities.  3. Right ventricular systolic function is normal. The right ventricular size is normal.  4. The mitral valve is grossly normal. No evidence of mitral valve regurgitation. No evidence of mitral stenosis.  5. Agitated saline contrast bubble study was negative, with no evidence of any interatrial shunt. FINDINGS  Left Ventricle: Left ventricular ejection fraction, by estimation, is 60 to 65%. The left ventricle has normal function. The left ventricle has no regional wall motion abnormalities. Definity contrast agent was given IV to delineate the left ventricular  endocardial borders. Right Ventricle: The right ventricular size is normal. No increase in right ventricular wall thickness. Right ventricular systolic function is normal. Left Atrium: Left atrial size was normal in size. Right Atrium: Right atrial size was normal in size. Pericardium: There is no evidence of pericardial effusion. Mitral Valve: The mitral  valve is grossly normal. No  evidence of mitral valve stenosis. Tricuspid Valve: The tricuspid valve is grossly normal. Tricuspid valve regurgitation is not demonstrated. No evidence of tricuspid stenosis. Venous: IVC assessment for right atrial pressure unable to be performed due to mechanical ventilation. IAS/Shunts: Agitated saline contrast was given intravenously to evaluate for intracardiac shunting. Agitated saline contrast bubble study was negative, with no evidence of any interatrial shunt. Eleonore Chiquito MD Electronically signed by Eleonore Chiquito MD Signature Date/Time: 03/04/2021/7:35:13 PM    Final    VAS [place] LOWER EXTREMITY VENOUS (DVT)  Result Date: 03/05/2021  Lower Venous DVT Study Patient Name:  Joe Carter  Date of Exam:   03/05/2021 Medical Rec #: [id number]       Accession #:    [id number] Date of Birth: 05-Jun-1980       Patient Gender: M Patient Age:   38 years Exam Location:  [facility] Procedure:      VAS [place] LOWER EXTREMITY VENOUS (DVT) Referring Phys: Ina Homes --------------------------------------------------------------------------------  Indications: Edema.  Limitations: Poor ultrasound/tissue interface and body habitus. Comparison Study: no prior Performing Technologist: Archie Patten RVS  Examination Guidelines: A complete evaluation includes B-mode imaging, spectral Doppler, color Doppler, and power Doppler as needed of all accessible portions of each vessel. Bilateral testing is considered an integral part of a complete examination. Limited examinations for reoccurring indications may be performed as noted. The reflux portion of the exam is performed with the patient in reverse Trendelenburg.  +---------+---------------+---------+-----------+----------+--------------+  RIGHT     Compressibility Phasicity Spontaneity Properties Thrombus Aging  +---------+---------------+---------+-----------+----------+--------------+  CFV       Full            Yes       Yes                                     +---------+---------------+---------+-----------+----------+--------------+  SFJ       Full                                                             +---------+---------------+---------+-----------+----------+--------------+  FV Prox   Full                                                             +---------+---------------+---------+-----------+----------+--------------+  FV Mid    Full                                                             +---------+---------------+---------+-----------+----------+--------------+  FV Distal Full                                                             +---------+---------------+---------+-----------+----------+--------------+  PFV       Full                                                             +---------+---------------+---------+-----------+----------+--------------+  POP       Full            Yes       Yes                                    +---------+---------------+---------+-----------+----------+--------------+  PTV       Full                                                             +---------+---------------+---------+-----------+----------+--------------+  PERO      Full                                                             +---------+---------------+---------+-----------+----------+--------------+   +---------+---------------+---------+-----------+----------+--------------+  LEFT      Compressibility Phasicity Spontaneity Properties Thrombus Aging  +---------+---------------+---------+-----------+----------+--------------+  CFV       Full            Yes       Yes                                    +---------+---------------+---------+-----------+----------+--------------+  SFJ       Full                                                             +---------+---------------+---------+-----------+----------+--------------+  FV Prox   Full                                                              +---------+---------------+---------+-----------+----------+--------------+  FV Mid    Full                                                             +---------+---------------+---------+-----------+----------+--------------+  FV Distal Full                                                             +---------+---------------+---------+-----------+----------+--------------+  PFV       Full                                                             +---------+---------------+---------+-----------+----------+--------------+  POP       Full            Yes       Yes                                    +---------+---------------+---------+-----------+----------+--------------+  PTV       Full                                                             +---------+---------------+---------+-----------+----------+--------------+  PERO      Full                                                             +---------+---------------+---------+-----------+----------+--------------+     Summary: BILATERAL: - No evidence of deep vein thrombosis seen in the lower extremities, bilaterally. -No evidence of popliteal cyst, bilaterally.   *See table(s) above for measurements and observations. Electronically signed by Servando Snare MD on 03/05/2021 at 4:20:14 PM.    Final      Discharge Instructions: Discharge Instructions     Call MD for:  difficulty breathing, headache or visual disturbances   Complete by: As directed    Call MD for:  extreme fatigue   Complete by: As directed    Call MD for:  hives   Complete by: As directed    Call MD for:  persistant dizziness or light-headedness   Complete by: As directed    Call MD for:  persistant nausea and vomiting   Complete by: As directed    Call MD for:  redness, tenderness, or signs of infection (pain, swelling, redness, odor or green/yellow discharge around incision site)   Complete by: As directed    Call MD for:  severe uncontrolled pain   Complete by: As  directed    Call MD for:  temperature >100.4   Complete by: As directed    Diet - low sodium heart healthy   Complete by: As directed    Discharge instructions   Complete by: As directed    Glad that we were able to get you home on Christmas Day!  Please continue to take all your medications as directed.  Please take the prednisone tomorrow (Monday) and Tuesday to complete the regimen.  [facility] that supplies your oxygen will be giving you some to go home today as well as coming to your home tomorrow to ensure that your tanks are full.  [facility] will schedule a hospital follow-up; please try and come to this appointment, they will call you to confirm.   Discharge wound care:   Complete by:  As directed    Continue to apply moisturizer to lower extremities daily.  Follow-up with wound care if needed.   Increase activity slowly   Complete by: As directed        Signed: Timothy Lasso, MD 03/18/2021, 11:35 AM   Pager: [phone]

## 2021-03-18 NOTE — TOC Transition Note (Signed)
Transition of Care [facility]) - CM/SW Discharge Note   Patient Details  Name: Joe Carter MRN: [id number] Date of Birth: 1980-12-27  [facility] of [facility] CM/SW Contact:  Bartholomew Crews, RN Phone Number: [phone] 03/18/2021, 9:48 AM   Clinical Narrative:     Spoke with patient at the bedside to discuss post acute transition. Patient lives with mother and brother. Stated he will call his grandfather to provide transportation home. Has home oxygen through [employer], but stated portable tanks are empty. He has a Paramedic. [employer] to provide portable tanks to bedside for transition home today. [employer] scheduled home visit for tomorrow to assess portable tanks. [employer] to verify benefit with insurance on Tuesday and will notify patient of the outcome. Patient also has a bipap at home through [employer]. No furhter TOC needs identified at this time.   Final next level of care: Home/Self Care Barriers to Discharge: No Barriers Identified   Patient Goals and CMS Choice Patient states their goals for this hospitalization and ongoing recovery are:: return home with family CMS Medicare.gov Compare Post Acute Care list provided to:: Patient Choice offered to / list presented to : Patient  Discharge Placement                       Discharge Plan and Services   Discharge Planning Services: CM Consult            DME Arranged: Oxygen DME Agency: [employer] Date DME Agency Contacted: 03/18/21 Time DME Agency Contacted: [phone] Representative spoke with at DME Agency: [place]: NA [place] Agency: NA        Social Determinants of Health ([place]) Interventions     Readmission Risk Interventions No flowsheet data found.

## 2021-03-20 ENCOUNTER — Telehealth: Payer: Self-pay | Admitting: Internal Medicine

## 2021-03-20 NOTE — Telephone Encounter (Signed)
TOC HFU appointment scheduled on 03/28/2021 at 3:15 pm with Dr. Laural Golden.  Unable to contact patient via telephone, but left detailed message with appointment date and time.

## 2021-03-20 NOTE — Telephone Encounter (Signed)
-----  Message from Timothy Lasso, MD sent at 03/18/2021 10:15 AM EST ----- Regarding: Hospital Follow up [place],   This patient was discharged today (12/25); he is a patient here. Could you please schedule a hospital FU for this patient. Thank you! And Happy holidays!!

## 2021-03-25 DIAGNOSIS — I509 Heart failure, unspecified: Secondary | ICD-10-CM | POA: Diagnosis not present

## 2021-03-28 ENCOUNTER — Other Ambulatory Visit: Payer: Self-pay

## 2021-03-28 ENCOUNTER — Encounter: Payer: Self-pay | Admitting: Internal Medicine

## 2021-03-28 ENCOUNTER — Other Ambulatory Visit: Payer: Self-pay | Admitting: Internal Medicine

## 2021-03-28 ENCOUNTER — Ambulatory Visit (INDEPENDENT_AMBULATORY_CARE_PROVIDER_SITE_OTHER): Payer: 59 | Admitting: Internal Medicine

## 2021-03-28 VITALS — BP 116/59 | HR 108 | Temp 98.2°F | Ht 77.0 in | Wt >= 6400 oz

## 2021-03-28 DIAGNOSIS — J9602 Acute respiratory failure with hypercapnia: Secondary | ICD-10-CM | POA: Diagnosis not present

## 2021-03-28 DIAGNOSIS — J9601 Acute respiratory failure with hypoxia: Secondary | ICD-10-CM

## 2021-03-28 DIAGNOSIS — I5033 Acute on chronic diastolic (congestive) heart failure: Secondary | ICD-10-CM

## 2021-03-28 DIAGNOSIS — E662 Morbid (severe) obesity with alveolar hypoventilation: Secondary | ICD-10-CM

## 2021-03-28 DIAGNOSIS — R5381 Other malaise: Secondary | ICD-10-CM

## 2021-03-28 DIAGNOSIS — I5032 Chronic diastolic (congestive) heart failure: Secondary | ICD-10-CM

## 2021-03-28 DIAGNOSIS — G629 Polyneuropathy, unspecified: Secondary | ICD-10-CM

## 2021-03-28 MED ORDER — FUROSEMIDE 80 MG PO TABS: 80.0000 mg | ORAL_TABLET | Freq: Every day | ORAL | 1 refills | Status: DC

## 2021-03-28 MED ORDER — ADVAIR HFA 115-21 MCG/ACT IN AERO: INHALATION_SPRAY | RESPIRATORY_TRACT | 2 refills | Status: AC

## 2021-03-28 MED ORDER — DULOXETINE HCL 30 MG PO CPEP: ORAL_CAPSULE | ORAL | 2 refills | Status: DC

## 2021-03-28 MED ORDER — DAPAGLIFLOZIN PROPANEDIOL 10 MG PO TABS: 10.0000 mg | ORAL_TABLET | Freq: Every day | ORAL | 0 refills | Status: DC

## 2021-03-28 MED ORDER — GABAPENTIN 300 MG PO CAPS: 300.0000 mg | ORAL_CAPSULE | Freq: Three times a day (TID) | ORAL | 0 refills | Status: DC
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
## 2021-03-28 NOTE — Assessment & Plan Note (Addendum)
He was recently hospitalized from 12/11-12/25 for acute hypoxic respiratory failure and acute diastolic heart failure. He had a long complicated stay requiring intubation in the ICU. Fortunately the patient improved with aggressive treatment and diuresis. He was discharged home on 12/25. Since then he has been doing well. States he felt sick the first week, unable to keep anything PO down but that has since improved. He has not been using his CPAP machine because the wire connecting the machine to the wall is missing. He has remained on 2L supplemental oxgyen. States he still feels weak since his hospitalization. Denies any other complaints at this time. He has not been taking his medications since discharge; states he has been unable to pick them up. Denies any swelling, SOB or urinary retention.  Pulse ox was checked on room air at rest and patient was saturating at 100%. Then checked while patient was ambulating and he maintained oxygen saturations >97%. Discussed he can discontinue using supplemental oxygen during the day but would recommend continuing supplemental oxygen during the night time.   - Continue supplemental oxygen at night time - Will try to obtain missing piece of CPAP for patient, will follow up with him tomorrow to determine which machine he has - Referral to PT for physical deconditioning

## 2021-03-28 NOTE — Assessment & Plan Note (Signed)
Will work on obtaining missing piece of patient's CPAP machine. Patient to inform us of which machine he has at home.

## 2021-03-28 NOTE — Assessment & Plan Note (Signed)
Recently hospitalized for acute respiratory failure and heart failure exacerbation. He had an extended hospital stay including the ICU and was aggressively diuresed. He was discharged 12/25 and unfortunately has not been taking his medications since discharge. States he was unable to pick them up. He is euvolemic on exam and breathing comfortably on room air. Stressed the importance of resuming medications to prevent volume overload. Refills sent.   -Follow up BMP

## 2021-03-28 NOTE — Patient Instructions (Addendum)
It was a pleasure seeing you today!  I am glad to see you out of the hospital. I have sent in a referral for physical therapy to help get your strength back. I am also checking your kidney function today. I will call you with any abnormal results.   Your oxygen saturations remained at great levels without oxygen while you walked in the office. You should be okay without oxygen during the day but continue using 2L at night time.  Thank you for allowing [place] to be a part of your care!

## 2021-03-29 LAB — BMP8+ANION GAP
Anion Gap: 13 mmol/L (ref 10.0–18.0)
BUN/Creatinine Ratio: 10 (ref 9–20)
BUN: 7 mg/dL (ref 6–24)
CO2: 25 mmol/L (ref 20–29)
Calcium: 9.3 mg/dL (ref 8.7–10.2)
Chloride: 101 mmol/L (ref 96–106)
Creatinine, Ser: 0.73 mg/dL — ABNORMAL LOW (ref 0.76–1.27)
Glucose: 226 mg/dL — ABNORMAL HIGH (ref 70–99)
Potassium: 4.7 mmol/L (ref 3.5–5.2)
Sodium: 139 mmol/L (ref 134–144)
eGFR: 116 mL/min/{1.73_m2} (ref >59)

## 2021-03-30 MED ORDER — FLUTICASONE-SALMETEROL 115-21 MCG/ACT IN AERO: 2.0000 | INHALATION_SPRAY | Freq: Two times a day (BID) | RESPIRATORY_TRACT | 12 refills | Status: DC

## 2021-03-30 NOTE — Addendum Note (Signed)
Addended by: Mike Craze on: 03/30/2021 12:50 PM   Modules accepted: Orders

## 2021-03-31 NOTE — Progress Notes (Signed)
Internal Medicine Clinic Attending  Case discussed with Dr. Laural Golden  At the time of the visit.  We reviewed the residents history and exam and pertinent patient test results.  I agree with the assessment, diagnosis, and plan of care documented in the residents note.  Significant weight gain since hospital discharge, though fortunately not causing pulmonary edema/hypoxia (hypoxic respiratory failure resolved).  Weight loss is imperative for this man.  Have GLP1 agonists been considered?

## 2021-04-23 ENCOUNTER — Ambulatory Visit (INDEPENDENT_AMBULATORY_CARE_PROVIDER_SITE_OTHER): Payer: 59 | Admitting: Internal Medicine

## 2021-04-23 VITALS — BP 140/86 | HR 89 | Temp 98.4°F | Ht 77.0 in | Wt >= 6400 oz

## 2021-04-23 DIAGNOSIS — Z6841 Body Mass Index (BMI) 40.0 and over, adult: Secondary | ICD-10-CM

## 2021-04-23 DIAGNOSIS — M545 Low back pain, unspecified: Secondary | ICD-10-CM | POA: Diagnosis not present

## 2021-04-23 DIAGNOSIS — E1142 Type 2 diabetes mellitus with diabetic polyneuropathy: Secondary | ICD-10-CM

## 2021-04-23 DIAGNOSIS — G629 Polyneuropathy, unspecified: Secondary | ICD-10-CM

## 2021-04-23 DIAGNOSIS — G8929 Other chronic pain: Secondary | ICD-10-CM | POA: Diagnosis not present

## 2021-04-23 DIAGNOSIS — E119 Type 2 diabetes mellitus without complications: Secondary | ICD-10-CM

## 2021-04-23 MED ORDER — OZEMPIC (0.25 OR 0.5 MG/DOSE) 2 MG/1.5ML ~~LOC~~ SOPN: 0.2500 mg | PEN_INJECTOR | SUBCUTANEOUS | 2 refills | Status: DC

## 2021-04-23 MED ORDER — DULOXETINE HCL 60 MG PO CPEP: ORAL_CAPSULE | ORAL | 2 refills | Status: DC

## 2021-04-23 NOTE — Patient Instructions (Signed)
° °  Today we discussed the following:   Back pain-I sent in a referral to physical therapy as well as the exercise provider program.  I do want to assist you in losing weight.  I have sent a prescription for Ozempic, take this medication once weekly.  After 4 weeks we can go up on the dose to 0.5 mg weekly.  I have also increase duloxetine from 30 mg to 60 mg daily.   Follow-up in about 4-8 weeks  Thank you for allowing [place] to be a part of your care!

## 2021-04-24 NOTE — Assessment & Plan Note (Signed)
Hemoglobin A1c 6.8 in December 2022.  Recommend that he start Ozempic for both diabetes and weight loss.

## 2021-04-24 NOTE — Assessment & Plan Note (Signed)
Patient presents for evaluation of acute on chronic lower back pain.  He had thoracic imaging last year which showed chronic wedging of T9 and spondylosis.  No other imaging has been done.  He denies any injuries, trauma or falls.  States that he stands for about 8 hours a day at work and this makes his pain significantly worse.  He denies any weakness, urinary or bowel incontinence.  Describes right upper thigh numbness.  He has not been doing physical therapy, states he was never called to have this scheduled after he was discharged from the hospital.  He states that the short course of naproxen did not help his pain in the past.  He is currently on duloxetine 30 mg daily.  Thoracic and lumbar radiographs ordered in April of last year however not completed.  On exam, patient has normal range of motion of bilateral extremities, strength 5 out of 5 and sensation intact but decreased along the right lateral upper thigh.  No point spinal tenderness however limited evaluation due to body habitus.  Reflexes were not able to be assessed due to patient's positioning.  He was unable to get onto the examination table for me.  Assessment/plan: Discussed at length that his chronic low back pain is in large due to his morbid obesity.  Recommended he increase his duloxetine to 60 mg daily and start the provider exercise and weight management program.  May also need to consider referral to Butch Penny our in-house nutritionist and diabetes coordinator.  Also recommended starting Ozempic if his insurance will approve this  -Increase duloxetine to 60 mg daily -Referral to provider exercise and weight management programs -Start Ozempic  -Referral to physical therapy resent as patient did not complete this after discharge from recent hospitalization

## 2021-04-24 NOTE — Assessment & Plan Note (Signed)
Discussed with patient that his chronic back pain is likely largely in part due to his obesity.  Encouraged lifestyle changes and discussed an exercise plan.  Patient is agreeable to referral to a provider exercise program.  Also recommended starting a GLP-1 agonist for both his diabetes and weight loss.    -Referred to provider exercise and weight management program -Start Ozempic
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
## 2021-04-24 NOTE — Progress Notes (Signed)
Internal Medicine Clinic Attending  Case discussed with Dr. Laural Golden  At the time of the visit.  We reviewed the residents history and exam and pertinent patient test results.  I agree with the assessment, diagnosis, and plan of care documented in the residents note.

## 2021-04-25 DIAGNOSIS — I509 Heart failure, unspecified: Secondary | ICD-10-CM | POA: Diagnosis not present

## 2021-05-02 ENCOUNTER — Telehealth: Payer: Self-pay

## 2021-05-02 NOTE — Telephone Encounter (Signed)
Scat paperwork has been completed and ready for pick up. Called pt to notified him, pt did not answer the phone. Form will be left inside the cabin at the front desk.

## 2021-05-07 ENCOUNTER — Other Ambulatory Visit: Payer: Self-pay | Admitting: Internal Medicine

## 2021-05-07 DIAGNOSIS — G8929 Other chronic pain: Secondary | ICD-10-CM

## 2021-05-08 ENCOUNTER — Inpatient Hospital Stay: Payer: 59 | Admitting: Critical Care Medicine

## 2021-05-08 ENCOUNTER — Encounter (INDEPENDENT_AMBULATORY_CARE_PROVIDER_SITE_OTHER): Payer: Self-pay

## 2021-05-08 ENCOUNTER — Ambulatory Visit: Payer: 59 | Admitting: Critical Care Medicine
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
## 2021-05-09 ENCOUNTER — Ambulatory Visit: Payer: 59 | Attending: Internal Medicine
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
## 2021-05-23 DIAGNOSIS — I509 Heart failure, unspecified: Secondary | ICD-10-CM | POA: Diagnosis not present

## 2021-06-15 ENCOUNTER — Emergency Department (HOSPITAL_COMMUNITY): Payer: 59

## 2021-06-15 ENCOUNTER — Emergency Department (HOSPITAL_COMMUNITY)
Admission: EM | Admit: 2021-06-15 | Discharge: 2021-06-15 | Disposition: A | Discharge status: Home / Self Care | Payer: 59 | Attending: Emergency Medicine | Admitting: Emergency Medicine

## 2021-06-15 ENCOUNTER — Other Ambulatory Visit: Payer: Self-pay

## 2021-06-15 DIAGNOSIS — X500XXA Overexertion from strenuous movement or load, initial encounter: Secondary | ICD-10-CM | POA: Diagnosis not present

## 2021-06-15 DIAGNOSIS — M545 Low back pain, unspecified: Secondary | ICD-10-CM | POA: Insufficient documentation

## 2021-06-15 MED ORDER — LIDOCAINE 5 % EX PTCH
1.0000 | MEDICATED_PATCH | CUTANEOUS | Status: DC
  Administered 2021-06-15: 1 via TRANSDERMAL
  Filled 2021-06-15: qty 1

## 2021-06-15 MED ORDER — METHOCARBAMOL 500 MG PO TABS
500.0000 mg | ORAL_TABLET | Freq: Once | ORAL | Status: AC
  Administered 2021-06-15: 500 mg via ORAL
  Filled 2021-06-15: qty 1

## 2021-06-15 MED ORDER — IBUPROFEN 600 MG PO TABS
600.0000 mg | ORAL_TABLET | Freq: Four times a day (QID) | ORAL | 0 refills | Status: AC | PRN
Start: 2021-06-15 — End: ?

## 2021-06-15 MED ORDER — IBUPROFEN 800 MG PO TABS
800.0000 mg | ORAL_TABLET | Freq: Once | ORAL | Status: AC
  Administered 2021-06-15: 800 mg via ORAL
  Filled 2021-06-15: qty 1

## 2021-06-15 MED ORDER — METHOCARBAMOL 500 MG PO TABS
500.0000 mg | ORAL_TABLET | Freq: Two times a day (BID) | ORAL | 0 refills | Status: AC
Start: 2021-06-15 — End: ?

## 2021-06-15 NOTE — ED Provider Notes (Signed)
?[facility] ?Provider Note ? ? ?CSN: [id number] ?Arrival date & time: 06/15/21  1436 ? ?  ? ?History ? ?Chief Complaint  ?Patient presents with  ? Back Pain  ? ? ?CHEYNE BOULDEN is a 41 y.o. male. ? ?Patient is a 41 year old male presenting for acute on chronic lower back pain.  Patient states he generally injured his lower back while lifting weights and has a slipped disc.  States today he was bending down to pick something up, heard a loud popping noise, and had severe pain.  Denies any numbness or tingling in the lower extremities, motor dysfunction, bowel or urinary incontinence.  ? ?The history is provided by the patient. No language interpreter was used.  ?Back Pain ?Associated symptoms: no abdominal pain, no chest pain, no dysuria and no fever   ? ?  ? ?Home Medications ?Prior to Admission medications   ?Medication Sig Start Date End Date Taking? Authorizing Provider  ?albuterol (PROVENTIL) (2.5 MG/3ML) 0.083% nebulizer solution Take 3 mLs (2.5 mg total) by nebulization every 4 (four) hours as needed for wheezing or shortness of breath. 03/18/21   Timothy Lasso, MD  ?arformoterol (BROVANA) 15 MCG/2ML NEBU Take 2 mLs (15 mcg total) by nebulization 2 (two) times daily. 03/18/21   Timothy Lasso, MD  ?atorvastatin (LIPITOR) 40 MG tablet Take 1 tablet (40 mg total) by mouth daily. 03/18/21 06/16/21  Timothy Lasso, MD  ?budesonide (PULMICORT) 0.5 MG/2ML nebulizer solution Take 2 mLs (0.5 mg total) by nebulization 2 (two) times daily. 03/18/21   Timothy Lasso, MD  ?dapagliflozin propanediol (FARXIGA) 10 MG TABS tablet Take 1 tablet (10 mg total) by mouth daily. 03/28/21   [name], Areeg N, DO  ?DULoxetine (CYMBALTA) 60 MG capsule TAKE 1 CAPSULE BY MOUTH EVERY DAY 05/13/21   [name], Areeg N, DO  ?fluticasone-salmeterol (ADVAIR HFA) 115-21 MCG/ACT inhaler INHALE 2 PUFFS INTO THE LUNGS TWICE A DAY 03/28/21   [name], Areeg N, DO  ?fluticasone-salmeterol (ADVAIR HFA) 115-21  MCG/ACT inhaler Inhale 2 puffs into the lungs 2 (two) times daily. 03/30/21   [name], Areeg N, DO  ?furosemide (LASIX) 80 MG tablet Take 1 tablet (80 mg total) by mouth daily. 03/28/21   [name], Areeg N, DO  ?gabapentin (NEURONTIN) 300 MG capsule Take 1 capsule (300 mg total) by mouth 3 (three) times daily. 03/28/21 04/27/21  [name], Areeg N, DO  ?revefenacin (YUPELRI) 175 MCG/3ML nebulizer solution Take 3 mLs (175 mcg total) by nebulization daily. 03/19/21   Timothy Lasso, MD  ?Semaglutide,0.25 or 0.5MG/DOS, (OZEMPIC, 0.25 OR 0.5 MG/DOSE,) 2 MG/1.5ML SOPN Inject 0.25 mg into the skin once a week. 04/23/21   [name], Areeg N, DO  ?   ? ?Allergies    ?Patient has no known allergies.   ? ?Review of Systems   ?Review of Systems  ?Constitutional:  Negative for chills and fever.  ?HENT:  Negative for ear pain and sore throat.   ?Eyes:  Negative for pain and visual disturbance.  ?Respiratory:  Negative for cough and shortness of breath.   ?Cardiovascular:  Negative for chest pain and palpitations.  ?Gastrointestinal:  Negative for abdominal pain and vomiting.  ?Genitourinary:  Negative for dysuria and hematuria.  ?Musculoskeletal:  Positive for back pain. Negative for arthralgias.  ?Skin:  Negative for color change and rash.  ?Neurological:  Negative for seizures and syncope.  ?All other systems reviewed and are negative. ? ?Physical Exam ?Updated Vital Signs ?BP (!) 149/96 (BP Location: Right Arm)   Pulse  89   Temp 98.2 ?F (36.8 ?C) (Oral)   Resp 18   SpO2 90%  ?Physical Exam ?Vitals and nursing note reviewed.  ?Constitutional:   ?   General: He is not in acute distress. ?   Appearance: He is well-developed.  ?HENT:  ?   Head: Normocephalic and atraumatic.  ?Eyes:  ?   Conjunctiva/sclera: Conjunctivae normal.  ?Cardiovascular:  ?   Rate and Rhythm: Normal rate and regular rhythm.  ?   Heart sounds: No murmur heard. ?Pulmonary:  ?   Effort: Pulmonary effort is normal. No respiratory distress.  ?   Breath sounds: Normal  breath sounds.  ?Abdominal:  ?   Palpations: Abdomen is soft.  ?   Tenderness: There is no abdominal tenderness.  ?Musculoskeletal:     ?   General: No swelling.  ?   Cervical back: Neck supple.  ?Skin: ?   General: Skin is warm and dry.  ?   Capillary Refill: Capillary refill takes less than 2 seconds.  ?Neurological:  ?   General: No focal deficit present.  ?   Mental Status: He is alert and oriented to person, place, and time.  ?   GCS: GCS eye subscore is 4. GCS verbal subscore is 5. GCS motor subscore is 6.  ?   Cranial Nerves: Cranial nerves 2-12 are intact.  ?   Sensory: Sensation is intact.  ?   Motor: Motor function is intact.  ?   Coordination: Coordination is intact.  ?   Gait: Gait is intact.  ?Psychiatric:     ?   Mood and Affect: Mood normal.  ? ? ?ED Results / Procedures / Treatments   ?Labs ?(all labs ordered are listed, but only abnormal results are displayed) ?Labs Reviewed  ?URINALYSIS, ROUTINE W REFLEX MICROSCOPIC  ? ? ?EKG ?None ? ?Radiology ?No results found. ? ?Procedures ?Procedures  ? ? ?Medications Ordered in ED ?Medications  ?ibuprofen (ADVIL) tablet 800 mg (has no administration in time range)  ?lidocaine (LIDODERM) 5 % 1 patch (has no administration in time range)  ?methocarbamol (ROBAXIN) tablet 500 mg (has no administration in time range)  ? ? ?ED Course/ Medical Decision Making/ A&P ?  ?                        ?Medical Decision Making ?Amount and/or Complexity of Data Reviewed ?Radiology: ordered. ? ?Risk ?Prescription drug management. ? ? ?44:66 PM ? 41 year old male presenting for acute on chronic lower back pain.  Patient is alert and oriented x3, no acute distress, afebrile, stable vital signs.  Physical exam demonstrates no neurovascular deficits.  No signs or symptoms concerning for cauda equina syndrome.  X-ray demonstrates no acute process.  Patient has right-sided paraspinal lumbar muscle spasm.  Robaxin given. ? ?Patient in no distress and overall condition improved here in  the ED. Detailed discussions were had with the patient regarding current findings, and need for close f/u with PCP or on call doctor. The patient has been instructed to return immediately if the symptoms worsen in any way for re-evaluation. Patient verbalized understanding and is in agreement with current care plan. All questions answered prior to discharge. ? ? ? ? ? ? ? ? ?Final Clinical Impression(s) / ED Diagnoses ?Final diagnoses:  ?Acute right-sided low back pain without sciatica  ? ? ?Rx / DC Orders ?ED Discharge Orders   ? ? None  ? ?  ? ? ?  ?Pearline Cables,  Delora Fuel, DO ?73/53/29 2351 ? ?

## 2021-06-15 NOTE — ED Notes (Signed)
Pt verbalized understanding of d/c instructions, meds, and followup care. Denies questions. VSS, no distress noted. Steady gait to exit with all belongings.  ?

## 2021-06-15 NOTE — ED Provider Triage Note (Signed)
Emergency Medicine Provider Triage Evaluation Note ? ?Penelope Coop , a 41 y.o. male  was evaluated in triage.  Pt complains of lower back pain.  Came by EMS. He states that he bent down to pick up something from the ground. He heard a pop in his right lower back followed by sharp pain. Pain been present ever since. Pain worse with movements. No urinary sx. No abdominal pain. No gait abnormality, sciatica, bowel or bladder dysfunction, or numbness. ? ?Review of Systems  ?Positive:  ?Negative:  ? ?Physical Exam  ?BP (!) 149/96 (BP Location: Right Arm)   Pulse 89   Temp 98.2 ?F (36.8 ?C) (Oral)   Resp 18   SpO2 90%  ?Gen:   Awake, no distress   ?Resp:  Normal effort  ?MSK:   Moves extremities without difficulty  ?Other:  Reproducible right flank pain ? ?Medical Decision Making  ?Medically screening exam initiated at 3:12 PM.  Appropriate orders placed.  LENNART GLADISH was informed that the remainder of the evaluation will be completed by another provider, this initial triage assessment does not replace that evaluation, and the importance of remaining in the ED until their evaluation is complete. ? ?Likely MSK, will obtain urine to screen for hematuria or infection ?  ?Adolphus Birchwood, PA-C ?06/15/21 1514 ? ?

## 2021-06-15 NOTE — ED Triage Notes (Signed)
Pt here via EMS from home with c/o right flank pain X1 day. Pt states he bent down to pick up something and heard a "pop" followed by sharp pain.  ? ?150/90 ?HR 88 ?RR 24 ?97% RA ?

## 2021-06-19 ENCOUNTER — Telehealth: Payer: Self-pay

## 2021-06-19 NOTE — Telephone Encounter (Signed)
Returned call to patient. States he needs a letter for work stating that he needs a chair to sit at his work 2/2 back pain that increases with standing. They will take his chair away if he doesn't have letter by end of week. He would like letter mailed to him as his job does not have a fax machine ?

## 2021-06-19 NOTE — Telephone Encounter (Signed)
Pt is requesting a call back ...  he stated that he got a Dr notes to be able to sit at work but now his job is requiring him to get another note. ?

## 2021-06-21 NOTE — Telephone Encounter (Signed)
Patient's work requesting updated letter for him to use a chair at work. This has been written and his mother will pick up the note during her appointment this afternoon. Also updated his [employer] form.  ?

## 2021-06-23 DIAGNOSIS — I509 Heart failure, unspecified: Secondary | ICD-10-CM | POA: Diagnosis not present

## 2021-06-27 ENCOUNTER — Other Ambulatory Visit: Payer: Self-pay | Admitting: Internal Medicine

## 2021-06-27 DIAGNOSIS — I10 Essential (primary) hypertension: Secondary | ICD-10-CM

## 2021-07-31 ENCOUNTER — Other Ambulatory Visit: Payer: Self-pay | Admitting: Student

## 2021-07-31 ENCOUNTER — Encounter: Payer: Self-pay | Admitting: Student

## 2021-07-31 ENCOUNTER — Ambulatory Visit (INDEPENDENT_AMBULATORY_CARE_PROVIDER_SITE_OTHER): Payer: 59 | Admitting: Student

## 2021-07-31 VITALS — BP 131/109 | HR 88 | Temp 98.3°F | Ht 77.0 in | Wt >= 6400 oz

## 2021-07-31 DIAGNOSIS — E662 Morbid (severe) obesity with alveolar hypoventilation: Secondary | ICD-10-CM

## 2021-07-31 DIAGNOSIS — E785 Hyperlipidemia, unspecified: Secondary | ICD-10-CM

## 2021-07-31 DIAGNOSIS — M545 Low back pain, unspecified: Secondary | ICD-10-CM

## 2021-07-31 DIAGNOSIS — I5033 Acute on chronic diastolic (congestive) heart failure: Secondary | ICD-10-CM

## 2021-07-31 DIAGNOSIS — I5032 Chronic diastolic (congestive) heart failure: Secondary | ICD-10-CM

## 2021-07-31 DIAGNOSIS — G8929 Other chronic pain: Secondary | ICD-10-CM

## 2021-07-31 DIAGNOSIS — I110 Hypertensive heart disease with heart failure: Secondary | ICD-10-CM | POA: Diagnosis not present

## 2021-07-31 DIAGNOSIS — I10 Essential (primary) hypertension: Secondary | ICD-10-CM

## 2021-07-31 DIAGNOSIS — E119 Type 2 diabetes mellitus without complications: Secondary | ICD-10-CM

## 2021-07-31 DIAGNOSIS — E1142 Type 2 diabetes mellitus with diabetic polyneuropathy: Secondary | ICD-10-CM | POA: Diagnosis not present

## 2021-07-31 DIAGNOSIS — Z6841 Body Mass Index (BMI) 40.0 and over, adult: Secondary | ICD-10-CM

## 2021-07-31 DIAGNOSIS — G629 Polyneuropathy, unspecified: Secondary | ICD-10-CM

## 2021-07-31 LAB — POCT GLYCOSYLATED HEMOGLOBIN (HGB A1C): Hemoglobin A1C: 5.9 % — AB (ref 4.0–5.6)

## 2021-07-31 LAB — GLUCOSE, CAPILLARY: Glucose-Capillary: 196 mg/dL — ABNORMAL HIGH (ref 70–99)

## 2021-07-31 MED ORDER — MOUNJARO 2.5 MG/0.5ML ~~LOC~~ SOAJ: 2.5000 mg | SUBCUTANEOUS | 3 refills | Status: DC

## 2021-07-31 MED ORDER — LOSARTAN POTASSIUM 25 MG PO TABS: 25.0000 mg | ORAL_TABLET | Freq: Every day | ORAL | 11 refills | Status: DC

## 2021-07-31 NOTE — Patient Instructions (Addendum)
Blood pressure ?Please start losartan 25 mg daily ? ?Heart failure ?Please restart you medications including lasix 80 mg daily ?Faxiga 10 mg daily ?It is very important you follow up in 1 week I am concerned that if we cannot get off some of the fluid you may need to be seen in the hospital  ? ?Shoulder ?Please tylenol 500 mg three times daily for pain  ?I have also included shoulder exercises to help with this ? ?Weight and diabetes ?Please start mounjaro 2.5 mg weekly to help with weight loss and blood sugar  ? ?Blurred vision ?I will send a referral to the eye doctor ? ?Follow up in one week ?

## 2021-08-01 NOTE — Assessment & Plan Note (Signed)
BP Readings from Last 3 Encounters:  ?07/31/21 (!) 131/109  ?06/15/21 135/76  ?04/23/21 140/86  ? ?Patient hypertensive today 136/97 and 131/109 on repeat.  His losartan-hydrochlorothiazide was held due to episodes of labile blood pressure during previous admission.  He has also been noncompliant with his Lasix.  Suspect this may be why his blood pressure is elevated today.   ? ?Restart losartan 25 mg daily ?Encourage patient to take his Lasix. ?Follow-up BP at appointment next week. ?

## 2021-08-01 NOTE — Assessment & Plan Note (Addendum)
Patient appears volume overloaded today.  He has not been taking his furosemide due to difficulty getting to the bathroom while at work.  He works at [employer].  He denies feeling more short of breath, chest pain.  Does have some exertional dyspnea but says this has been stable since last visit.  Weight appears to be about 1 pound over his last visit in January.  3+ pitting edema of bilateral lower extremities to the thighs.  Due to body habitus difficult to appreciate JVD.  Lung sounds are quiet.  Discussed that I am concerned that he may become fluid overloaded and need to be admitted if he continues to retain fluid.  Last picked up a 30-day supply of Lasix in January he does still have refills furosemide 80 mg, but he did not pick this up due to difficulty with transportation.  Now has [employer] to help with transportation and says he can pick it up.   ? ?Other barriers to taking the Lasix is his job.  States he has difficulty getting to the bathroom always on the Lasix.  I emphasized importance of being compliant with his medications to prevent further hospitalizations.  Patient is a understanding.  We discussed mail order pharmacy and he is interested.  I discussed this with Rosendo Gros and will call Friday health plan to find out if they have a mail order pharmacy. ? ?Restart furosemide 80 mg daily ?Encouraged him to take his Farxiga, losartan, atorvastatin as well. ?We will transition his medications to mail order through [facility].  ? ? ?

## 2021-08-02 MED ORDER — DAPAGLIFLOZIN PROPANEDIOL 10 MG PO TABS: 10.0000 mg | ORAL_TABLET | Freq: Every day | ORAL | 3 refills | Status: AC

## 2021-08-02 MED ORDER — DULOXETINE HCL 60 MG PO CPEP: 60.0000 mg | ORAL_CAPSULE | Freq: Every day | ORAL | 3 refills | Status: AC

## 2021-08-02 MED ORDER — LOSARTAN POTASSIUM 25 MG PO TABS: 25.0000 mg | ORAL_TABLET | Freq: Every day | ORAL | 3 refills | Status: AC

## 2021-08-02 MED ORDER — FUROSEMIDE 80 MG PO TABS: 80.0000 mg | ORAL_TABLET | Freq: Every day | ORAL | 3 refills | Status: AC

## 2021-08-02 MED ORDER — MOUNJARO 2.5 MG/0.5ML ~~LOC~~ SOAJ: 2.5000 mg | SUBCUTANEOUS | 3 refills | Status: DC

## 2021-08-02 MED ORDER — GABAPENTIN 300 MG PO CAPS: 300.0000 mg | ORAL_CAPSULE | Freq: Three times a day (TID) | ORAL | 3 refills | Status: AC

## 2021-08-02 MED ORDER — ATORVASTATIN CALCIUM 40 MG PO TABS: 40.0000 mg | ORAL_TABLET | Freq: Every day | ORAL | 3 refills | Status: AC

## 2021-08-02 NOTE — Assessment & Plan Note (Addendum)
Was unable to go to the pharmacy.  Now has arranged transportation through [employer].  We will put him on Mounjaro to increase weight loss benefit.  Unable to perform foot exam as he did not want to keep the socks off. ?

## 2021-08-02 NOTE — Assessment & Plan Note (Signed)
A1c is 5.9% today.  He has not started Ozempic because he has been unable to get transport to the pharmacy.  Given his obesity we will start him on Mounjaro 2.5 mg weekly. ?

## 2021-08-02 NOTE — Progress Notes (Signed)
? ?Established Patient Office Visit ? ?Subjective   ?Patient ID: Joe Carter, male    DOB: 1980-11-24  Age: 41 y.o. MRN: [id number] ? ?Chief Complaint  ?Patient presents with  ? Shoulder Pain  ?  Left shoulder for pain for about 2 weeks   ? Referral  ?  Eye doctor  ? ? ?Joe Carter is a 41 year old person living with heart failure, diabetes, severe obesity who presents today for follow-up of diabetes. ? ? ?  ? ?Review of Systems  ?Constitutional:  Negative for chills and fever.  ?Respiratory:  Positive for shortness of breath. Negative for cough and wheezing.   ?Cardiovascular:  Positive for leg swelling. Negative for chest pain and orthopnea.  ?Gastrointestinal:  Negative for abdominal pain, nausea and vomiting.  ?Genitourinary:  Positive for frequency. Negative for dysuria.  ?All other systems reviewed and are negative. ? ?  ?Objective:  ?  ? ?BP (!) 131/109 (BP Location: Left Wrist, Patient Position: Sitting, Cuff Size: Normal)   Pulse 88   Temp 98.3 ?F (36.8 ?C) (Oral)   Ht 6' 5" (1.956 m)   Wt (!) 498 lb 9.6 oz (226.2 kg)   SpO2 95%   BMI 59.13 kg/m?  ?BP Readings from Last 3 Encounters:  ?07/31/21 (!) 131/109  ?06/15/21 135/76  ?04/23/21 140/86  ? ?  ? ?Physical Exam ?Constitutional:   ?   Appearance: He is obese.  ?   Comments: Chronically ill-appearing  ?HENT:  ?   Head: Normocephalic and atraumatic.  ?   Mouth/Throat:  ?   Mouth: Mucous membranes are moist.  ?   Pharynx: Oropharynx is clear.  ?Eyes:  ?   Extraocular Movements: Extraocular movements intact.  ?   Pupils: Pupils are equal, round, and reactive to light.  ?Cardiovascular:  ?   Rate and Rhythm: Normal rate.  ?   Heart sounds: No murmur heard. ?  No friction rub. No gallop.  ?   Comments: Cannot appreciate JVD, however patient sitting in wheelchair with difficult body habitus, was unable to transfer to exam table ?Pulmonary:  ?   Effort: Respiratory distress present.  ?   Comments: Difficult to take given body habitus, breath sounds quiet  no wheezing or crackles could be appreciated ?Abdominal:  ?   General: Abdomen is flat. Bowel sounds are normal. There is distension.  ?   Palpations: Abdomen is soft.  ?   Tenderness: There is no abdominal tenderness.  ?Musculoskeletal:  ?   Right lower leg: Edema (3+) present.  ?   Left lower leg: Edema (3+) present.  ?Skin: ?   General: Skin is warm.  ?   Comments: Dry skin of bilateral heels and feet, no obvious wounds  ?Neurological:  ?   General: No focal deficit present.  ?   Mental Status: He is alert and oriented to person, place, and time.  ?Psychiatric:     ?   Mood and Affect: Mood normal.     ?   Behavior: Behavior normal.  ? ? ? ?Results for orders placed or performed in visit on 07/31/21  ?Glucose, capillary  ?Result Value Ref Range  ? Glucose-Capillary 196 (H) 70 - 99 mg/dL  ?POC Hbg A1C  ?Result Value Ref Range  ? Hemoglobin A1C 5.9 (A) 4.0 - 5.6 %  ? HbA1c POC (<> result, manual entry)    ? HbA1c, POC (prediabetic range)    ? HbA1c, POC (controlled diabetic range)    ? ? ?  ? ?  The 10-year ASCVD risk score (Arnett DK, et al., 2019) is: 20.1% ? ?  ?Assessment & Plan:  ? ?Problem List Items Addressed This Visit   ? ?  ? Cardiovascular and Mediastinum  ? Essential hypertension  ?  BP Readings from Last 3 Encounters:  ?07/31/21 (!) 131/109  ?06/15/21 135/76  ?04/23/21 140/86  ?Patient hypertensive today 136/97 and 131/109 on repeat.  His losartan-hydrochlorothiazide was held due to episodes of labile blood pressure during previous admission.  He has also been noncompliant with his Lasix.  Suspect this may be why his blood pressure is elevated today.   ? ?Restart losartan 25 mg daily ?Encourage patient to take his Lasix. ?Follow-up BP at appointment next week. ?  ?  ? Relevant Medications  ? atorvastatin (LIPITOR) 40 MG tablet  ? furosemide (LASIX) 80 MG tablet  ? losartan (COZAAR) 25 MG tablet  ? Acute on chronic diastolic (congestive) heart failure ([place])  ?  Patient appears volume overloaded today.  He  has not been taking his furosemide due to difficulty getting to the bathroom while at work.  He works at [employer].  He denies feeling more short of breath, chest pain.  Does have some exertional dyspnea but says this has been stable since last visit.  Weight appears to be about 1 pound over his last visit in January.  3+ pitting edema of bilateral lower extremities to the thighs.  Due to body habitus difficult to appreciate JVD.  Lung sounds are quiet.  Discussed that I am concerned that he may become fluid overloaded and need to be admitted if he continues to retain fluid.  Last picked up a 30-day supply of Lasix in January he does still have refills furosemide 80 mg, but he did not pick this up due to difficulty with transportation.  Now has [facility] to help with transportation and says he can pick it up.   ? ?Other barriers to taking the Lasix is his job.  States he has difficulty getting to the bathroom always on the Lasix.  I emphasized importance of being compliant with his medications to prevent further hospitalizations.  Patient is a understanding.  We discussed mail order pharmacy and he is interested.  I discussed this with Joe Carter and will call Friday health plan to find out if they have a mail order pharmacy. ? ?Restart furosemide 80 mg daily ?Encouraged him to take his Farxiga, losartan, atorvastatin as well. ?We will transition his medications to mail order through [facility].  ? ? ? ?  ?  ? Relevant Medications  ? atorvastatin (LIPITOR) 40 MG tablet  ? furosemide (LASIX) 80 MG tablet  ? losartan (COZAAR) 25 MG tablet  ?  ? Endocrine  ? Diabetes mellitus ([place]) - Primary  ?  A1c is 5.9% today.  He has not started Ozempic because he has been unable to get transport to the pharmacy.  Given his obesity we will start him on Mounjaro 2.5 mg weekly. ? ?  ?  ? Relevant Medications  ? atorvastatin (LIPITOR) 40 MG tablet  ? dapagliflozin propanediol (FARXIGA) 10 MG TABS tablet  ? losartan (COZAAR) 25 MG  tablet  ? tirzepatide (MOUNJARO) 2.5 MG/0.5ML Pen  ? Other Relevant Orders  ? POC Hbg A1C ([place])  ? Ambulatory referral to Ophthalmology  ?  ? Nervous and Auditory  ? Neuropathy  ? Relevant Medications  ? gabapentin (NEURONTIN) 300 MG capsule  ?  ? Other  ? Back pain  ? Relevant Medications  ?  DULoxetine (CYMBALTA) 60 MG capsule  ? Morbid obesity with BMI of 60.0-69.9, adult ([place])  ?  Was unable to go to the pharmacy.  Now has arranged transportation through [facility].  We will put him on Mounjaro to increase weight loss benefit. ? ?  ?  ? Relevant Medications  ? dapagliflozin propanediol (FARXIGA) 10 MG TABS tablet  ? tirzepatide (MOUNJARO) 2.5 MG/0.5ML Pen  ? ?Other Visit Diagnoses   ? ? Chronic diastolic heart failure ([place])      ? Relevant Medications  ? atorvastatin (LIPITOR) 40 MG tablet  ? furosemide (LASIX) 80 MG tablet  ? losartan (COZAAR) 25 MG tablet  ? ?  ? ? ?Return in about 1 week (around 08/07/2021).  ? ? ?Iona Beard, MD ? ?

## 2021-08-02 NOTE — Progress Notes (Signed)
Internal Medicine Clinic Attending ? ?Case discussed with Dr. Lisabeth Devoid  At the time of the visit.  We reviewed the resident?s history and exam and pertinent patient test results.  I agree with the assessment, diagnosis, and plan of care documented in the resident?s note. ? ?

## 2021-08-03 ENCOUNTER — Other Ambulatory Visit: Payer: Self-pay | Admitting: Student

## 2021-08-03 DIAGNOSIS — E119 Type 2 diabetes mellitus without complications: Secondary | ICD-10-CM

## 2021-08-29 ENCOUNTER — Emergency Department (HOSPITAL_COMMUNITY): Payer: 59

## 2021-08-29 ENCOUNTER — Encounter (HOSPITAL_COMMUNITY): Payer: Self-pay | Admitting: Emergency Medicine

## 2021-08-29 ENCOUNTER — Inpatient Hospital Stay (HOSPITAL_COMMUNITY)
Admission: EM | Admit: 2021-08-29 | Discharge: 2021-09-22 | DRG: 207 | Disposition: E | Payer: 59 | Attending: Internal Medicine | Admitting: Internal Medicine

## 2021-08-29 ENCOUNTER — Other Ambulatory Visit: Payer: Self-pay

## 2021-08-29 ENCOUNTER — Ambulatory Visit (INDEPENDENT_AMBULATORY_CARE_PROVIDER_SITE_OTHER): Payer: 59 | Admitting: Student

## 2021-08-29 VITALS — BP 127/87 | HR 73 | Temp 98.2°F | Ht 77.0 in | Wt >= 6400 oz

## 2021-08-29 DIAGNOSIS — R6521 Severe sepsis with septic shock: Secondary | ICD-10-CM | POA: Diagnosis not present

## 2021-08-29 DIAGNOSIS — J9622 Acute and chronic respiratory failure with hypercapnia: Secondary | ICD-10-CM | POA: Diagnosis not present

## 2021-08-29 DIAGNOSIS — R739 Hyperglycemia, unspecified: Secondary | ICD-10-CM

## 2021-08-29 DIAGNOSIS — E662 Morbid (severe) obesity with alveolar hypoventilation: Secondary | ICD-10-CM

## 2021-08-29 DIAGNOSIS — J9811 Atelectasis: Secondary | ICD-10-CM | POA: Diagnosis present

## 2021-08-29 DIAGNOSIS — F17210 Nicotine dependence, cigarettes, uncomplicated: Secondary | ICD-10-CM | POA: Diagnosis present

## 2021-08-29 DIAGNOSIS — Z6841 Body Mass Index (BMI) 40.0 and over, adult: Secondary | ICD-10-CM | POA: Diagnosis not present

## 2021-08-29 DIAGNOSIS — I959 Hypotension, unspecified: Secondary | ICD-10-CM | POA: Diagnosis not present

## 2021-08-29 DIAGNOSIS — Y92009 Unspecified place in unspecified non-institutional (private) residence as the place of occurrence of the external cause: Secondary | ICD-10-CM

## 2021-08-29 DIAGNOSIS — Z91128 Patient's intentional underdosing of medication regimen for other reason: Secondary | ICD-10-CM

## 2021-08-29 DIAGNOSIS — J9621 Acute and chronic respiratory failure with hypoxia: Secondary | ICD-10-CM

## 2021-08-29 DIAGNOSIS — A419 Sepsis, unspecified organism: Secondary | ICD-10-CM | POA: Diagnosis not present

## 2021-08-29 DIAGNOSIS — J9602 Acute respiratory failure with hypercapnia: Secondary | ICD-10-CM

## 2021-08-29 DIAGNOSIS — R0902 Hypoxemia: Principal | ICD-10-CM

## 2021-08-29 DIAGNOSIS — I2609 Other pulmonary embolism with acute cor pulmonale: Secondary | ICD-10-CM

## 2021-08-29 DIAGNOSIS — I2723 Pulmonary hypertension due to lung diseases and hypoxia: Secondary | ICD-10-CM | POA: Diagnosis present

## 2021-08-29 DIAGNOSIS — R502 Drug induced fever: Secondary | ICD-10-CM | POA: Diagnosis not present

## 2021-08-29 DIAGNOSIS — Z7951 Long term (current) use of inhaled steroids: Secondary | ICD-10-CM | POA: Diagnosis not present

## 2021-08-29 DIAGNOSIS — G9340 Encephalopathy, unspecified: Secondary | ICD-10-CM

## 2021-08-29 DIAGNOSIS — I2781 Cor pulmonale (chronic): Secondary | ICD-10-CM | POA: Diagnosis present

## 2021-08-29 DIAGNOSIS — Z7189 Other specified counseling: Secondary | ICD-10-CM | POA: Diagnosis not present

## 2021-08-29 DIAGNOSIS — Z9981 Dependence on supplemental oxygen: Secondary | ICD-10-CM | POA: Diagnosis not present

## 2021-08-29 DIAGNOSIS — I110 Hypertensive heart disease with heart failure: Secondary | ICD-10-CM | POA: Diagnosis present

## 2021-08-29 DIAGNOSIS — J80 Acute respiratory distress syndrome: Principal | ICD-10-CM | POA: Diagnosis present

## 2021-08-29 DIAGNOSIS — M7989 Other specified soft tissue disorders: Secondary | ICD-10-CM | POA: Diagnosis not present

## 2021-08-29 DIAGNOSIS — Z7985 Long-term (current) use of injectable non-insulin antidiabetic drugs: Secondary | ICD-10-CM | POA: Diagnosis not present

## 2021-08-29 DIAGNOSIS — Z66 Do not resuscitate: Secondary | ICD-10-CM | POA: Diagnosis not present

## 2021-08-29 DIAGNOSIS — N179 Acute kidney failure, unspecified: Secondary | ICD-10-CM | POA: Diagnosis not present

## 2021-08-29 DIAGNOSIS — J9601 Acute respiratory failure with hypoxia: Secondary | ICD-10-CM

## 2021-08-29 DIAGNOSIS — I4891 Unspecified atrial fibrillation: Secondary | ICD-10-CM | POA: Diagnosis not present

## 2021-08-29 DIAGNOSIS — I5033 Acute on chronic diastolic (congestive) heart failure: Secondary | ICD-10-CM | POA: Diagnosis present

## 2021-08-29 DIAGNOSIS — Z833 Family history of diabetes mellitus: Secondary | ICD-10-CM

## 2021-08-29 DIAGNOSIS — G9341 Metabolic encephalopathy: Secondary | ICD-10-CM | POA: Diagnosis present

## 2021-08-29 DIAGNOSIS — R110 Nausea: Secondary | ICD-10-CM | POA: Diagnosis not present

## 2021-08-29 DIAGNOSIS — I5031 Acute diastolic (congestive) heart failure: Secondary | ICD-10-CM | POA: Diagnosis not present

## 2021-08-29 DIAGNOSIS — E870 Hyperosmolality and hypernatremia: Secondary | ICD-10-CM | POA: Diagnosis not present

## 2021-08-29 DIAGNOSIS — Z91199 Patient's noncompliance with other medical treatment and regimen due to unspecified reason: Secondary | ICD-10-CM

## 2021-08-29 DIAGNOSIS — E119 Type 2 diabetes mellitus without complications: Secondary | ICD-10-CM | POA: Diagnosis not present

## 2021-08-29 DIAGNOSIS — I509 Heart failure, unspecified: Secondary | ICD-10-CM

## 2021-08-29 DIAGNOSIS — R7303 Prediabetes: Secondary | ICD-10-CM | POA: Diagnosis present

## 2021-08-29 DIAGNOSIS — Z20822 Contact with and (suspected) exposure to covid-19: Secondary | ICD-10-CM | POA: Diagnosis present

## 2021-08-29 DIAGNOSIS — Z515 Encounter for palliative care: Secondary | ICD-10-CM | POA: Diagnosis not present

## 2021-08-29 DIAGNOSIS — J189 Pneumonia, unspecified organism: Secondary | ICD-10-CM

## 2021-08-29 DIAGNOSIS — I471 Supraventricular tachycardia: Secondary | ICD-10-CM | POA: Diagnosis not present

## 2021-08-29 DIAGNOSIS — Z79899 Other long term (current) drug therapy: Secondary | ICD-10-CM

## 2021-08-29 DIAGNOSIS — E874 Mixed disorder of acid-base balance: Secondary | ICD-10-CM | POA: Diagnosis not present

## 2021-08-29 DIAGNOSIS — T501X6A Underdosing of loop [high-ceiling] diuretics, initial encounter: Secondary | ICD-10-CM | POA: Diagnosis present

## 2021-08-29 DIAGNOSIS — U071 COVID-19: Secondary | ICD-10-CM | POA: Diagnosis not present

## 2021-08-29 DIAGNOSIS — D72829 Elevated white blood cell count, unspecified: Secondary | ICD-10-CM | POA: Diagnosis present

## 2021-08-29 LAB — BASIC METABOLIC PANEL
Anion gap: 8 (ref 5–15)
Anion gap: 8 (ref 5–15)
BUN: 6 mg/dL (ref 6–20)
BUN: <5 mg/dL — ABNORMAL LOW (ref 6–20)
CO2: 36 mmol/L — ABNORMAL HIGH (ref 22–32)
CO2: 34 mmol/L — ABNORMAL HIGH (ref 22–32)
Calcium: 8.8 mg/dL — ABNORMAL LOW (ref 8.9–10.3)
Calcium: 8.8 mg/dL — ABNORMAL LOW (ref 8.9–10.3)
Chloride: 97 mmol/L — ABNORMAL LOW (ref 98–111)
Chloride: 99 mmol/L (ref 98–111)
Creatinine, Ser: 0.8 mg/dL (ref 0.61–1.24)
Creatinine, Ser: 0.85 mg/dL (ref 0.61–1.24)
GFR, Estimated: >60 mL/min (ref >60)
GFR, Estimated: >60 mL/min (ref >60)
Glucose, Bld: 132 mg/dL — ABNORMAL HIGH (ref 70–99)
Glucose, Bld: 176 mg/dL — ABNORMAL HIGH (ref 70–99)
Potassium: 4.5 mmol/L (ref 3.5–5.1)
Potassium: 4.1 mmol/L (ref 3.5–5.1)
Sodium: 141 mmol/L (ref 135–145)
Sodium: 141 mmol/L (ref 135–145)

## 2021-08-29 LAB — POCT I-STAT 7, (LYTES, BLD GAS, ICA,H+H)
Acid-Base Excess: 7 mmol/L — ABNORMAL HIGH (ref 0.0–2.0)
Bicarbonate: 39.9 mmol/L — ABNORMAL HIGH (ref 20.0–28.0)
Calcium, Ion: 1.23 mmol/L (ref 1.15–1.40)
HCT: 57 % — ABNORMAL HIGH (ref 39.0–52.0)
Hemoglobin: 19.4 g/dL — ABNORMAL HIGH (ref 13.0–17.0)
O2 Saturation: 79 %
Patient temperature: 98
Potassium: 4.4 mmol/L (ref 3.5–5.1)
Sodium: 143 mmol/L (ref 135–145)
TCO2: 43 mmol/L — ABNORMAL HIGH (ref 22–32)
pCO2 arterial: 91.1 mmHg (ref 32–48)
pH, Arterial: 7.248 — ABNORMAL LOW (ref 7.35–7.45)
pO2, Arterial: 52 mmHg — ABNORMAL LOW (ref 83–108)

## 2021-08-29 LAB — I-STAT VENOUS BLOOD GAS, ED
Acid-Base Excess: 9 mmol/L — ABNORMAL HIGH (ref 0.0–2.0)
Bicarbonate: 42.2 mmol/L — ABNORMAL HIGH (ref 20.0–28.0)
Calcium, Ion: 1.18 mmol/L (ref 1.15–1.40)
HCT: 55 % — ABNORMAL HIGH (ref 39.0–52.0)
Hemoglobin: 18.7 g/dL — ABNORMAL HIGH (ref 13.0–17.0)
O2 Saturation: 99 %
Potassium: 3.9 mmol/L (ref 3.5–5.1)
Sodium: 142 mmol/L (ref 135–145)
TCO2: 45 mmol/L — ABNORMAL HIGH (ref 22–32)
pCO2, Ven: 95.4 mmHg (ref 44–60)
pH, Ven: 7.254 (ref 7.25–7.43)
pO2, Ven: 181 mmHg — ABNORMAL HIGH (ref 32–45)

## 2021-08-29 LAB — CBC WITH DIFFERENTIAL/PLATELET
Abs Immature Granulocytes: 0.03 10*3/uL (ref 0.00–0.07)
Basophils Absolute: 0.1 10*3/uL (ref 0.0–0.1)
Basophils Relative: 1 %
Eosinophils Absolute: 0.2 10*3/uL (ref 0.0–0.5)
Eosinophils Relative: 2 %
HCT: 55.1 % — ABNORMAL HIGH (ref 39.0–52.0)
Hemoglobin: 16.3 g/dL (ref 13.0–17.0)
Immature Granulocytes: 0 %
Lymphocytes Relative: 18 %
Lymphs Abs: 1.9 10*3/uL (ref 0.7–4.0)
MCH: 30.1 pg (ref 26.0–34.0)
MCHC: 29.6 g/dL — ABNORMAL LOW (ref 30.0–36.0)
MCV: 101.8 fL — ABNORMAL HIGH (ref 80.0–100.0)
Monocytes Absolute: 0.6 10*3/uL (ref 0.1–1.0)
Monocytes Relative: 6 %
Neutro Abs: 7.7 10*3/uL (ref 1.7–7.7)
Neutrophils Relative %: 73 %
Platelets: 233 10*3/uL (ref 150–400)
RBC: 5.41 MIL/uL (ref 4.22–5.81)
RDW: 15.7 % — ABNORMAL HIGH (ref 11.5–15.5)
WBC: 10.6 10*3/uL — ABNORMAL HIGH (ref 4.0–10.5)
nRBC: 0 % (ref 0.0–0.2)

## 2021-08-29 LAB — TROPONIN I (HIGH SENSITIVITY)
Troponin I (High Sensitivity): 18 ng/L — ABNORMAL HIGH (ref <18)
Troponin I (High Sensitivity): 16 ng/L (ref <18)

## 2021-08-29 LAB — CBG MONITORING, ED: Glucose-Capillary: 193 mg/dL — ABNORMAL HIGH (ref 70–99)

## 2021-08-29 LAB — CBC
HCT: 55.5 % — ABNORMAL HIGH (ref 39.0–52.0)
Hemoglobin: 16.3 g/dL (ref 13.0–17.0)
MCH: 30.1 pg (ref 26.0–34.0)
MCHC: 29.4 g/dL — ABNORMAL LOW (ref 30.0–36.0)
MCV: 102.4 fL — ABNORMAL HIGH (ref 80.0–100.0)
Platelets: 237 10*3/uL (ref 150–400)
RBC: 5.42 MIL/uL (ref 4.22–5.81)
RDW: 15.8 % — ABNORMAL HIGH (ref 11.5–15.5)
WBC: 9.9 10*3/uL (ref 4.0–10.5)
nRBC: 0 % (ref 0.0–0.2)

## 2021-08-29 LAB — GLUCOSE, CAPILLARY: Glucose-Capillary: 147 mg/dL — ABNORMAL HIGH (ref 70–99)

## 2021-08-29 LAB — SARS CORONAVIRUS 2 BY RT PCR: SARS Coronavirus 2 by RT PCR: NEGATIVE

## 2021-08-29 LAB — BRAIN NATRIURETIC PEPTIDE
B Natriuretic Peptide: 190.8 pg/mL — ABNORMAL HIGH (ref 0.0–100.0)
B Natriuretic Peptide: 180.2 pg/mL — ABNORMAL HIGH (ref 0.0–100.0)

## 2021-08-29 MED ORDER — SODIUM CHLORIDE 0.9 % IV SOLN
2.0000 g | INTRAVENOUS | Status: DC
  Administered 2021-08-30: 2 g via INTRAVENOUS
  Filled 2021-08-29: qty 20

## 2021-08-29 MED ORDER — ARFORMOTEROL TARTRATE 15 MCG/2ML IN NEBU
15.0000 ug | INHALATION_SOLUTION | Freq: Two times a day (BID) | RESPIRATORY_TRACT | Status: DC
  Administered 2021-08-29 – 2021-08-31 (×4): 15 ug via RESPIRATORY_TRACT
  Filled 2021-08-29 (×5): qty 2

## 2021-08-29 MED ORDER — REVEFENACIN 175 MCG/3ML IN SOLN
175.0000 ug | Freq: Every day | RESPIRATORY_TRACT | Status: DC
  Administered 2021-08-30 – 2021-08-31 (×2): 175 ug via RESPIRATORY_TRACT
  Filled 2021-08-29 (×3): qty 3

## 2021-08-29 MED ORDER — FUROSEMIDE 10 MG/ML IJ SOLN
40.0000 mg | Freq: Once | INTRAMUSCULAR | Status: AC
Start: 2021-08-29 — End: 2021-08-29
  Administered 2021-08-29: 40 mg via INTRAVENOUS
  Filled 2021-08-29: qty 4

## 2021-08-29 MED ORDER — ENOXAPARIN SODIUM 40 MG/0.4ML IJ SOSY
40.0000 mg | PREFILLED_SYRINGE | INTRAMUSCULAR | Status: DC
  Administered 2021-08-29 – 2021-09-01 (×4): 40 mg via SUBCUTANEOUS
  Filled 2021-08-29 (×3): qty 0.4

## 2021-08-29 MED ORDER — INSULIN ASPART 100 UNIT/ML IJ SOLN
0.0000 [IU] | INTRAMUSCULAR | Status: DC
  Administered 2021-08-29: 4 [IU] via SUBCUTANEOUS
  Administered 2021-08-30 (×3): 3 [IU] via SUBCUTANEOUS
  Administered 2021-08-31 (×2): 4 [IU] via SUBCUTANEOUS
  Administered 2021-08-31: 3 [IU] via SUBCUTANEOUS
  Administered 2021-08-31: 4 [IU] via SUBCUTANEOUS
  Administered 2021-09-01: 7 [IU] via SUBCUTANEOUS
  Administered 2021-09-01 (×3): 4 [IU] via SUBCUTANEOUS
  Administered 2021-09-01: 7 [IU] via SUBCUTANEOUS
  Administered 2021-09-02 (×6): 4 [IU] via SUBCUTANEOUS
  Administered 2021-09-03 (×2): 7 [IU] via SUBCUTANEOUS
  Administered 2021-09-03: 11 [IU] via SUBCUTANEOUS
  Administered 2021-09-03: 4 [IU] via SUBCUTANEOUS
  Administered 2021-09-03 – 2021-09-04 (×6): 11 [IU] via SUBCUTANEOUS
  Administered 2021-09-04 (×2): 7 [IU] via SUBCUTANEOUS
  Administered 2021-09-05: 15 [IU] via SUBCUTANEOUS
  Administered 2021-09-05: 11 [IU] via SUBCUTANEOUS
  Administered 2021-09-05: 7 [IU] via SUBCUTANEOUS
  Administered 2021-09-05: 11 [IU] via SUBCUTANEOUS
  Administered 2021-09-05 – 2021-09-06 (×2): 7 [IU] via SUBCUTANEOUS
  Administered 2021-09-06: 4 [IU] via SUBCUTANEOUS
  Administered 2021-09-06: 7 [IU] via SUBCUTANEOUS
  Administered 2021-09-06: 4 [IU] via SUBCUTANEOUS
  Administered 2021-09-06: 11 [IU] via SUBCUTANEOUS
  Administered 2021-09-06: 7 [IU] via SUBCUTANEOUS
  Administered 2021-09-07: 4 [IU] via SUBCUTANEOUS
  Administered 2021-09-07: 7 [IU] via SUBCUTANEOUS
  Administered 2021-09-07: 3 [IU] via SUBCUTANEOUS
  Administered 2021-09-07: 4 [IU] via SUBCUTANEOUS

## 2021-08-29 MED ORDER — SODIUM CHLORIDE 0.9 % IV SOLN
500.0000 mg | INTRAVENOUS | Status: DC
  Administered 2021-08-29 – 2021-08-30 (×2): 500 mg via INTRAVENOUS
  Filled 2021-08-29 (×2): qty 5

## 2021-08-29 MED ORDER — SODIUM CHLORIDE 0.9 % IV SOLN
2.0000 g | Freq: Once | INTRAVENOUS | Status: AC
  Administered 2021-08-29: 2 g via INTRAVENOUS
  Filled 2021-08-29: qty 20

## 2021-08-29 MED ORDER — DOCUSATE SODIUM 100 MG PO CAPS: 100.0000 mg | ORAL_CAPSULE | Freq: Two times a day (BID) | ORAL | Status: DC | PRN

## 2021-08-29 MED ORDER — PANTOPRAZOLE SODIUM 40 MG IV SOLR
40.0000 mg | Freq: Every day | INTRAVENOUS | Status: DC
  Administered 2021-08-29 – 2021-09-06 (×9): 40 mg via INTRAVENOUS
  Filled 2021-08-29 (×9): qty 10

## 2021-08-29 MED ORDER — CHLORHEXIDINE GLUCONATE CLOTH 2 % EX PADS
6.0000 | MEDICATED_PAD | Freq: Every day | CUTANEOUS | Status: DC
  Administered 2021-08-30 – 2021-09-08 (×11): 6 via TOPICAL

## 2021-08-29 MED ORDER — POLYETHYLENE GLYCOL 3350 17 G PO PACK: 17.0000 g | PACK | Freq: Every day | ORAL | Status: DC | PRN

## 2021-08-29 MED ORDER — LABETALOL HCL 5 MG/ML IV SOLN: 20.0000 mg | INTRAVENOUS | Status: DC | PRN

## 2021-08-29 MED ORDER — BUDESONIDE 0.5 MG/2ML IN SUSP
0.5000 mg | Freq: Two times a day (BID) | RESPIRATORY_TRACT | Status: DC
  Administered 2021-08-29 – 2021-08-31 (×4): 0.5 mg via RESPIRATORY_TRACT
  Filled 2021-08-29 (×5): qty 2
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
## 2021-08-29 NOTE — Assessment & Plan Note (Signed)
Patient presented hypoxemic with O2 saturations in the low 80s on RA likely due to HFpEF exacerbation. Patient was also noted to be drowsy on exam and likely has a component of acute on chronic hypercapnia as well.  -ABG order was placed.  -BiPAP if noted to have AoC hypercarbic respiratory failure. At baseline patient likely retains CO2 2/2 obesity hypoventilation syndrome.

## 2021-08-29 NOTE — ED Notes (Signed)
Patient attempted to leave, removed monitoring devices, removed O2 [place], pulled out IV and attempted to stand. Patient provided sandwiches per EDP advice and is willing to stay at this time.

## 2021-08-29 NOTE — ED Notes (Signed)
Patient removed bipap mask reporting that it is giving him a migraine. Patient placed on 6L [place] and remains at 89%. Patient reports that if he does not get food he is leaving. EDP made aware.

## 2021-08-29 NOTE — ED Triage Notes (Signed)
Pt reports SHOB x 1 week and increased leg swelling. Pt reports unable to take his diuretic. RA O2 79%.

## 2021-08-29 NOTE — ED Notes (Addendum)
Patient falling asleep and has periods of apnea. Patient sats drop to 78% but return to 90% upon waking. EDP aware at this time.
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
## 2021-08-29 NOTE — Assessment & Plan Note (Addendum)
Patient presents with worsening dyspnea, orthopnea, and PND. On exam he was noted to be in acute hypoxemic respiratory failure with his oxygen saturations in the 80s on room air. He was also noted to have bilateral rales and bilateral lower extremity edema, all suggestive of acute on chronic diastolic heart failure.   Patient's exacerbation is a result of his medication non adherence. He has no chest pressure to suggest ACS as precipitant.  Given patient's respiratory status he will require hospitalization for diuresis. He was supposed to be on PO lasix 24m qd at home. Could like start IV lasix 455monce admitted.  -IV lasix -BNP -BMP  -Resume farxiga and losartan as blood pressures and renal function allows  -Wean O2 as able  -Daily weights  -HF navigator team for education

## 2021-08-29 NOTE — Progress Notes (Signed)
Pt transported from ED20 to 3O32 w/o complications on the biPAP. RT will cont to monitor as needed.

## 2021-08-29 NOTE — Progress Notes (Signed)
Patient transported via W/C to ER.   On 4 liters of Oxygen. Alert and oriented and talking without distress. Report to EMT.

## 2021-08-29 NOTE — ED Notes (Signed)
Patient at 83% on 2L. Patient placed on NRB at this time.

## 2021-08-29 NOTE — ED Provider Notes (Signed)
[facility] EMERGENCY DEPARTMENT Provider Note   CSN: [id number] Arrival date & time: 08/24/2021  1450     History  Chief Complaint  Patient presents with   Shortness of Breath    Joe Carter is a 41 y.o. male.  Pt complains of feeling short of breath patient reports that he went to the internal medicine clinic today and was sent here for evaluation patient reports he has a history of congestive heart failure.  He admits that he has not been taking his medications he cannot tell me the last time that he took a diuretic.  Patient reports that he has been working and when he works he forgets to take it.  She reports he has oxygen at home he normally uses 2 L.  Patient denies any current cough or congestion he states he has not felt sick.  Patient reports he has been diagnosed with asthma but he has not had an asthma attack as an adult.  The history is provided by the patient. No language interpreter was used.  Shortness of Breath Severity:  Severe Onset quality:  Sudden Duration:  1 day Timing:  Constant Progression:  Worsening Chronicity:  Recurrent Relieved by:  Nothing Worsened by:  Nothing Ineffective treatments:  None tried Associated symptoms: abdominal pain and chest pain   Risk factors: obesity       Home Medications Prior to Admission medications   Medication Sig Start Date End Date Taking? Authorizing Provider  albuterol (PROVENTIL) (2.5 MG/3ML) 0.083% nebulizer solution Take 3 mLs (2.5 mg total) by nebulization every 4 (four) hours as needed for wheezing or shortness of breath. 03/18/21   Timothy Lasso, MD  arformoterol (BROVANA) 15 MCG/2ML NEBU Take 2 mLs (15 mcg total) by nebulization 2 (two) times daily. 03/18/21   Timothy Lasso, MD  atorvastatin (LIPITOR) 40 MG tablet Take 1 tablet (40 mg total) by mouth daily. 08/02/21 07/28/22  Iona Beard, MD  budesonide (PULMICORT) 0.5 MG/2ML nebulizer solution Take 2 mLs (0.5 mg total) by nebulization  2 (two) times daily. 03/18/21   Timothy Lasso, MD  dapagliflozin propanediol (FARXIGA) 10 MG TABS tablet Take 1 tablet (10 mg total) by mouth daily. 08/02/21 07/28/22  Iona Beard, MD  DULoxetine (CYMBALTA) 60 MG capsule Take 1 capsule (60 mg total) by mouth daily. 08/02/21 07/28/22  Iona Beard, MD  fluticasone-salmeterol (ADVAIR HFA) [phone] MCG/ACT inhaler INHALE 2 PUFFS INTO THE LUNGS TWICE A DAY 03/28/21   [name], Areeg N, DO  fluticasone-salmeterol (ADVAIR HFA) 115-21 MCG/ACT inhaler Inhale 2 puffs into the lungs 2 (two) times daily. 03/30/21   [name], Areeg N, DO  furosemide (LASIX) 80 MG tablet Take 1 tablet (80 mg total) by mouth daily. 08/02/21   Iona Beard, MD  gabapentin (NEURONTIN) 300 MG capsule Take 1 capsule (300 mg total) by mouth 3 (three) times daily. 08/02/21 07/28/22  Iona Beard, MD  ibuprofen (ADVIL) 600 MG tablet Take 1 tablet (600 mg total) by mouth every 6 (six) hours as needed. 5/78/46   Campbell Stall P, DO  losartan (COZAAR) 25 MG tablet Take 1 tablet (25 mg total) by mouth daily. 08/02/21 08/02/22  Iona Beard, MD  methocarbamol (ROBAXIN) 500 MG tablet Take 1 tablet (500 mg total) by mouth 2 (two) times daily. 9/62/95   Lianne Cure, DO  MOUNJARO 2.5 MG/0.5ML Pen INJECT 2.5MG INTO SKIN ONCE A WEEK 08/03/21   Iona Beard, MD  revefenacin [facility]) 175 MCG/3ML nebulizer solution Take 3 mLs (175 mcg total)  by nebulization daily. 03/19/21   Timothy Lasso, MD      Allergies    Patient has no known allergies.    Review of Systems   Review of Systems  Respiratory:  Positive for shortness of breath.   Cardiovascular:  Positive for chest pain.  Gastrointestinal:  Positive for abdominal pain.  All other systems reviewed and are negative.  Physical Exam Updated Vital Signs BP (!) 149/95   Pulse 96   Temp 98.5 F (36.9 C) (Oral)   Resp (!) 25   SpO2 90%  Physical Exam Vitals reviewed.  Constitutional:      Appearance: He is obese.  HENT:     Head:  Normocephalic.     Mouth/Throat:     Mouth: Mucous membranes are moist.  Cardiovascular:     Rate and Rhythm: Normal rate and regular rhythm.     Heart sounds: Normal heart sounds.  Chest:     Chest wall: No tenderness.  Abdominal:     Palpations: Abdomen is soft.  Musculoskeletal:     Right lower leg: Edema present.     Left lower leg: Edema present.  Skin:    General: Skin is warm.  Neurological:     General: No focal deficit present.     Mental Status: He is alert.  Psychiatric:        Mood and Affect: Mood normal.    ED Results / Procedures / Treatments   Labs (all labs ordered are listed, but only abnormal results are displayed) Labs Reviewed  BRAIN NATRIURETIC PEPTIDE - Abnormal; Notable for the following components:      Result Value   B Natriuretic Peptide 180.2 (*)    All other components within normal limits  CBC WITH DIFFERENTIAL/PLATELET - Abnormal; Notable for the following components:   WBC 10.6 (*)    HCT 55.1 (*)    MCV 101.8 (*)    MCHC 29.6 (*)    RDW 15.7 (*)    All other components within normal limits  BASIC METABOLIC PANEL - Abnormal; Notable for the following components:   CO2 34 (*)    Glucose, Bld 176 (*)    BUN <5 (*)    Calcium 8.8 (*)    All other components within normal limits  I-STAT VENOUS BLOOD GAS, ED - Abnormal; Notable for the following components:   pCO2, Ven 95.4 (*)    pO2, Ven 181 (*)    Bicarbonate 42.2 (*)    TCO2 45 (*)    Acid-Base Excess 9.0 (*)    HCT 55.0 (*)    Hemoglobin 18.7 (*)    All other components within normal limits  TROPONIN I (HIGH SENSITIVITY) - Abnormal; Notable for the following components:   Troponin I (High Sensitivity) 18 (*)    All other components within normal limits  TROPONIN I (HIGH SENSITIVITY)    EKG None  Radiology DG Chest Portable 1 View  Result Date: 09/06/2021 CLINICAL DATA:  Shortness of breath and leg swelling for the past week. EXAM: PORTABLE CHEST 1 VIEW COMPARISON:  Chest  x-ray dated March 05, 2021. FINDINGS: Unchanged mild cardiomegaly. Pulmonary vascular congestion. Small ill-defined opacities in the right lung. Peripheral consolidation in the left mid to lower lung. No pleural effusion or pneumothorax. No acute osseous abnormality. IMPRESSION: 1. Findings suspicious for multifocal pneumonia. Electronically Signed   By: Titus Dubin M.D.   On: 08/27/2021 18:05    Procedures .Critical Care Performed by: Fransico Meadow, PA-C  Authorized by: Fransico Meadow, PA-C   Critical care provider statement:    Critical care time (minutes):  60   Critical care start time:  09/19/2021 5:00 PM   Critical care end time:  08/31/2021 8:42 PM   Critical care time was exclusive of:  Separately billable procedures and treating other patients and teaching time   Critical care was necessary to treat or prevent imminent or life-threatening deterioration of the following conditions:  Cardiac failure, circulatory failure and respiratory failure   Critical care was time spent personally by me on the following activities:  Development of treatment plan with patient or surrogate, discussions with consultants, evaluation of patient's response to treatment, interpretation of cardiac output measurements, obtaining history from patient or surrogate, re-evaluation of patient's condition, review of old charts, pulse oximetry, ordering and review of radiographic studies, ordering and review of laboratory studies and ordering and performing treatments and interventions   Care discussed with: admitting provider      Medications Ordered in ED Medications  cefTRIAXone (ROCEPHIN) 2 g in sodium chloride 0.9 % 100 mL IVPB (has no administration in time range)  azithromycin (ZITHROMAX) 500 mg in sodium chloride 0.9 % 250 mL IVPB (has no administration in time range)  furosemide (LASIX) injection 40 mg (40 mg Intravenous Given 09/18/2021 1730)    ED Course/ Medical Decision Making/ A&P                            Medical Decision Making Patient complains of increasing recent shortness of breath.  Patient was sent to the emergency department from the internal medicine clinic his oxygen saturations were in the low 80s  Amount and/or Complexity of Data Reviewed Independent Historian:     Details: I spoke to patient's mother.  She spoke with patient and advised him to cooperate she was not able to provide any additional history External Data Reviewed: notes.    Details: Notes reviewed from previous hospitalizations Labs: ordered. Decision-making details documented in ED Course.    Details: Labs ordered, reviewed and interpreted VBG shows a PCO2 of 95 PO2 181.  CBC shows white blood cell count of 10.6 Radiology: ordered and independent interpretation performed. Decision-making details documented in ED Course.    Details: Chest x-ray shows findings suspicious for multifocal pneumonia ECG/medicine tests: ordered and independent interpretation performed. Decision-making details documented in ED Course. Discussion of management or test interpretation with external provider(s): Critical care consulted and will admit pt   Risk Prescription drug management. Risk Details: Patient was placed on 2 L of O2 at triage he continued to have decreased oxygen saturation and was placed on 4 L.  When patient was placed in the room his oxygen level was in the low 80s RN placed him on a nonrebreather at 15 L and was able to get his oxygen up to 98%.  Dr. Vallery Ridge in to see and evaluate patient.  We attempted to decrease patient's oxygen.  Therapy consulted and patient placed on BiPAP patient given Lasix 40 mg IV.  Patient given a DuoNeb by respiratory therapy.  Patient informed the nurse that he was hungry he removed his BiPAP and took out his IV.  I went to the room to reevaluate patient.  He was attempting to eat a sandwich stating that he was leaving.  I spoke to patient and his mother.  Patient's mother was able to get  patient to agree to stay he also agreed to  have the BiPAP replaced.  Patient initially seemed to do well on BiPAP but has episodes when he falls asleep and his oxygen saturation decreases to the mid 50s.  Patient is given Rocephin and Zithromax for possible pneumonia.  I will consult critical care for evaluation.           Final Clinical Impression(s) / ED Diagnoses Final diagnoses:  Hypoxia  Acute on chronic congestive heart failure, unspecified heart failure type [facility])  Community acquired pneumonia, unspecified laterality    Rx / DC Orders ED Discharge Orders     None         Sidney Ace 09/11/2021 2044    Charlesetta Shanks, MD 09/02/21 1447

## 2021-08-30 ENCOUNTER — Inpatient Hospital Stay (HOSPITAL_COMMUNITY): Payer: 59

## 2021-08-30 DIAGNOSIS — M7989 Other specified soft tissue disorders: Secondary | ICD-10-CM

## 2021-08-30 DIAGNOSIS — J9622 Acute and chronic respiratory failure with hypercapnia: Secondary | ICD-10-CM | POA: Diagnosis not present

## 2021-08-30 LAB — BASIC METABOLIC PANEL
Anion gap: 10 (ref 5–15)
BUN: 7 mg/dL (ref 6–20)
BUN: 6 mg/dL (ref 6–20)
CO2: 39 mmol/L — ABNORMAL HIGH (ref 22–32)
CO2: >45 mmol/L — ABNORMAL HIGH (ref 22–32)
Calcium: 8.8 mg/dL — ABNORMAL LOW (ref 8.9–10.3)
Calcium: 8.9 mg/dL (ref 8.9–10.3)
Chloride: 92 mmol/L — ABNORMAL LOW (ref 98–111)
Chloride: 91 mmol/L — ABNORMAL LOW (ref 98–111)
Creatinine, Ser: 1.05 mg/dL (ref 0.61–1.24)
Creatinine, Ser: 1.05 mg/dL (ref 0.61–1.24)
GFR, Estimated: >60 mL/min (ref >60)
GFR, Estimated: >60 mL/min (ref >60)
Glucose, Bld: 108 mg/dL — ABNORMAL HIGH (ref 70–99)
Glucose, Bld: 124 mg/dL — ABNORMAL HIGH (ref 70–99)
Potassium: 4.8 mmol/L (ref 3.5–5.1)
Potassium: 5 mmol/L (ref 3.5–5.1)
Sodium: 141 mmol/L (ref 135–145)
Sodium: 143 mmol/L (ref 135–145)

## 2021-08-30 LAB — RESPIRATORY PANEL BY PCR

## 2021-08-30 LAB — CBC
HCT: 56 % — ABNORMAL HIGH (ref 39.0–52.0)
Hemoglobin: 16.4 g/dL (ref 13.0–17.0)
MCH: 29.9 pg (ref 26.0–34.0)
MCHC: 29.3 g/dL — ABNORMAL LOW (ref 30.0–36.0)
MCV: 102 fL — ABNORMAL HIGH (ref 80.0–100.0)
Platelets: 287 10*3/uL (ref 150–400)
RBC: 5.49 MIL/uL (ref 4.22–5.81)
RDW: 15.9 % — ABNORMAL HIGH (ref 11.5–15.5)
WBC: 11 10*3/uL — ABNORMAL HIGH (ref 4.0–10.5)
nRBC: 0.2 % (ref 0.0–0.2)

## 2021-08-30 LAB — GLUCOSE, CAPILLARY
Glucose-Capillary: 105 mg/dL — ABNORMAL HIGH (ref 70–99)
Glucose-Capillary: 114 mg/dL — ABNORMAL HIGH (ref 70–99)
Glucose-Capillary: 123 mg/dL — ABNORMAL HIGH (ref 70–99)
Glucose-Capillary: 126 mg/dL — ABNORMAL HIGH (ref 70–99)
Glucose-Capillary: 129 mg/dL — ABNORMAL HIGH (ref 70–99)
Glucose-Capillary: 142 mg/dL — ABNORMAL HIGH (ref 70–99)
Glucose-Capillary: 146 mg/dL — ABNORMAL HIGH (ref 70–99)
Glucose-Capillary: 83 mg/dL (ref 70–99)

## 2021-08-30 LAB — MAGNESIUM: Magnesium: 2.1 mg/dL (ref 1.7–2.4)

## 2021-08-30 LAB — POCT I-STAT 7, (LYTES, BLD GAS, ICA,H+H)
Acid-Base Excess: 10 mmol/L — ABNORMAL HIGH (ref 0.0–2.0)
Bicarbonate: 42.5 mmol/L — ABNORMAL HIGH (ref 20.0–28.0)
Calcium, Ion: 1.22 mmol/L (ref 1.15–1.40)
HCT: 56 % — ABNORMAL HIGH (ref 39.0–52.0)
Hemoglobin: 19 g/dL — ABNORMAL HIGH (ref 13.0–17.0)
O2 Saturation: 82 %
Patient temperature: 98
Potassium: 4.5 mmol/L (ref 3.5–5.1)
Sodium: 142 mmol/L (ref 135–145)
TCO2: 45 mmol/L — ABNORMAL HIGH (ref 22–32)
pCO2 arterial: 90.6 mmHg (ref 32–48)
pH, Arterial: 7.278 — ABNORMAL LOW (ref 7.35–7.45)
pO2, Arterial: 54 mmHg — ABNORMAL LOW (ref 83–108)

## 2021-08-30 LAB — D-DIMER, QUANTITATIVE: D-Dimer, Quant: 0.79 ug/mL-FEU — ABNORMAL HIGH (ref 0.00–0.50)

## 2021-08-30 LAB — PHOSPHORUS: Phosphorus: 5.5 mg/dL — ABNORMAL HIGH (ref 2.5–4.6)

## 2021-08-30 LAB — PROCALCITONIN: Procalcitonin: <0.1 ng/mL

## 2021-08-30 MED ORDER — FUROSEMIDE 10 MG/ML IJ SOLN
80.0000 mg | Freq: Once | INTRAMUSCULAR | Status: AC
  Administered 2021-08-30: 80 mg via INTRAVENOUS
  Filled 2021-08-30: qty 8

## 2021-08-30 MED ORDER — FUROSEMIDE 10 MG/ML IJ SOLN
80.0000 mg | Freq: Once | INTRAMUSCULAR | Status: AC
Start: 2021-08-30 — End: 2021-08-30
  Administered 2021-08-30: 80 mg via INTRAVENOUS
  Filled 2021-08-30: qty 8

## 2021-08-30 MED ORDER — CHLORHEXIDINE GLUCONATE 0.12 % MT SOLN
15.0000 mL | Freq: Two times a day (BID) | OROMUCOSAL | Status: DC
  Administered 2021-08-30 – 2021-09-01 (×4): 15 mL via OROMUCOSAL
  Filled 2021-08-30 (×3): qty 15

## 2021-08-30 MED ORDER — ONDANSETRON HCL 4 MG/2ML IJ SOLN
INTRAMUSCULAR | Status: AC
  Filled 2021-08-30: qty 2

## 2021-08-30 MED ORDER — ONDANSETRON HCL 4 MG/2ML IJ SOLN
4.0000 mg | Freq: Four times a day (QID) | INTRAMUSCULAR | Status: DC | PRN
  Administered 2021-08-30: 4 mg via INTRAVENOUS

## 2021-08-30 MED ORDER — ORAL CARE MOUTH RINSE
15.0000 mL | Freq: Two times a day (BID) | OROMUCOSAL | Status: DC
  Administered 2021-08-31 – 2021-09-01 (×4): 15 mL via OROMUCOSAL

## 2021-08-30 NOTE — Progress Notes (Signed)
[facility] Progress Note Patient Name: Joe Carter DOB: Nov 28, 1980 MRN: [id number]   Date of Service  08/30/2021  HPI/Events of Note  Nursing concerned about sat = 86% on 100% BiPAP.   eICU Interventions  Plan: Lasix 80 mg IV X 1 now.      Intervention Category Major Interventions: Hypoxemia - evaluation and management  Hildreth Orsak Cornelia Copa 08/30/2021, 10:38 PM

## 2021-08-30 NOTE — Progress Notes (Signed)
Called to bedside because he is less responsive. After initially stimulating him with sternal rub, he is awake and still able to follow all of the same commands as in the ED. Vt increased from 450> 600s when more alert. Saturations in low to mid-90s. Ok to con't current bipap settings. Most likely some of his decreased responsiveness is due to time of day because once he is awake he is still able to follow commands. D/w RN & RT.  Julian Hy, DO 08/30/21 3:31 AM [facility]

## 2021-08-30 NOTE — Progress Notes (Signed)
[facility] Progress Note Patient Name: Joe Carter DOB: 18-Feb-1981 MRN: [id number]   Date of Service  08/30/2021  HPI/Events of Note  Nausea - QTc interval = 0.41 seconds.   eICU Interventions  Plan: Zofran 4 mg IV Q 6 hour PRN N/V.     Intervention Category Major Interventions: Other:  Lysle Dingwall 08/30/2021, 8:49 PM

## 2021-08-30 NOTE — Progress Notes (Signed)
[facility] Progress Note Patient Name: Joe Carter DOB: Mar 11, 1981 MRN: [id number]   Date of Service  08/30/2021  HPI/Events of Note  Nursing reports that patient is minimally responsive and sats dropping to 88-89%,   eICU Interventions  Plan: ABG STAT. Will request that PCCM ground team evaluate the patient at bedside.      Intervention Category Major Interventions: Change in mental status - evaluation and management;Hypoxemia - evaluation and management  Joe Carter 08/30/2021, 2:45 AM

## 2021-08-30 NOTE — Addendum Note (Signed)
Addended by: Althea Grimmer on: 08/30/2021 04:37 PM   Modules accepted: Orders
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
## 2021-08-31 ENCOUNTER — Inpatient Hospital Stay (HOSPITAL_COMMUNITY): Payer: 59

## 2021-08-31 ENCOUNTER — Encounter (HOSPITAL_COMMUNITY): Payer: 59

## 2021-08-31 ENCOUNTER — Encounter (HOSPITAL_COMMUNITY): Payer: Self-pay | Admitting: Critical Care Medicine

## 2021-08-31 DIAGNOSIS — I5031 Acute diastolic (congestive) heart failure: Secondary | ICD-10-CM | POA: Diagnosis not present

## 2021-08-31 DIAGNOSIS — R0902 Hypoxemia: Secondary | ICD-10-CM | POA: Diagnosis not present

## 2021-08-31 DIAGNOSIS — I4891 Unspecified atrial fibrillation: Secondary | ICD-10-CM

## 2021-08-31 DIAGNOSIS — J9621 Acute and chronic respiratory failure with hypoxia: Secondary | ICD-10-CM | POA: Diagnosis not present

## 2021-08-31 DIAGNOSIS — I509 Heart failure, unspecified: Secondary | ICD-10-CM

## 2021-08-31 DIAGNOSIS — R739 Hyperglycemia, unspecified: Secondary | ICD-10-CM

## 2021-08-31 DIAGNOSIS — J9622 Acute and chronic respiratory failure with hypercapnia: Secondary | ICD-10-CM | POA: Diagnosis not present

## 2021-08-31 DIAGNOSIS — E662 Morbid (severe) obesity with alveolar hypoventilation: Secondary | ICD-10-CM

## 2021-08-31 DIAGNOSIS — R7303 Prediabetes: Secondary | ICD-10-CM | POA: Insufficient documentation

## 2021-08-31 DIAGNOSIS — I959 Hypotension, unspecified: Secondary | ICD-10-CM

## 2021-08-31 DIAGNOSIS — J189 Pneumonia, unspecified organism: Secondary | ICD-10-CM

## 2021-08-31 DIAGNOSIS — G9341 Metabolic encephalopathy: Secondary | ICD-10-CM

## 2021-08-31 LAB — CBC
HCT: 57.5 % — ABNORMAL HIGH (ref 39.0–52.0)
Hemoglobin: 16.4 g/dL (ref 13.0–17.0)
MCH: 29.7 pg (ref 26.0–34.0)
MCHC: 28.5 g/dL — ABNORMAL LOW (ref 30.0–36.0)
MCV: 104.2 fL — ABNORMAL HIGH (ref 80.0–100.0)
Platelets: 233 10*3/uL (ref 150–400)
RBC: 5.52 MIL/uL (ref 4.22–5.81)
RDW: 15.5 % (ref 11.5–15.5)
WBC: 13.9 10*3/uL — ABNORMAL HIGH (ref 4.0–10.5)
nRBC: 0 % (ref 0.0–0.2)

## 2021-08-31 LAB — POCT I-STAT 7, (LYTES, BLD GAS, ICA,H+H)
Acid-Base Excess: 15 mmol/L — ABNORMAL HIGH (ref 0.0–2.0)
Acid-Base Excess: 18 mmol/L — ABNORMAL HIGH (ref 0.0–2.0)
Acid-Base Excess: 18 mmol/L — ABNORMAL HIGH (ref 0.0–2.0)
Acid-Base Excess: 19 mmol/L — ABNORMAL HIGH (ref 0.0–2.0)
Bicarbonate: 46 mmol/L — ABNORMAL HIGH (ref 20.0–28.0)
Bicarbonate: 45.8 mmol/L — ABNORMAL HIGH (ref 20.0–28.0)
Bicarbonate: 44.8 mmol/L — ABNORMAL HIGH (ref 20.0–28.0)
Bicarbonate: 45.5 mmol/L — ABNORMAL HIGH (ref 20.0–28.0)
Calcium, Ion: 1.11 mmol/L — ABNORMAL LOW (ref 1.15–1.40)
Calcium, Ion: 1.1 mmol/L — ABNORMAL LOW (ref 1.15–1.40)
Calcium, Ion: 1.08 mmol/L — ABNORMAL LOW (ref 1.15–1.40)
Calcium, Ion: 1.08 mmol/L — ABNORMAL LOW (ref 1.15–1.40)
HCT: 57 % — ABNORMAL HIGH (ref 39.0–52.0)
HCT: 55 % — ABNORMAL HIGH (ref 39.0–52.0)
HCT: 54 % — ABNORMAL HIGH (ref 39.0–52.0)
HCT: 54 % — ABNORMAL HIGH (ref 39.0–52.0)
Hemoglobin: 19.4 g/dL — ABNORMAL HIGH (ref 13.0–17.0)
Hemoglobin: 18.7 g/dL — ABNORMAL HIGH (ref 13.0–17.0)
Hemoglobin: 18.4 g/dL — ABNORMAL HIGH (ref 13.0–17.0)
Hemoglobin: 18.4 g/dL — ABNORMAL HIGH (ref 13.0–17.0)
O2 Saturation: 93 %
O2 Saturation: 83 %
O2 Saturation: 90 %
O2 Saturation: 90 %
Patient temperature: 99.8
Patient temperature: 99.7
Patient temperature: 99.7
Patient temperature: 103.1
Potassium: 4.1 mmol/L (ref 3.5–5.1)
Potassium: 3.9 mmol/L (ref 3.5–5.1)
Potassium: 4.2 mmol/L (ref 3.5–5.1)
Potassium: 4 mmol/L (ref 3.5–5.1)
Sodium: 140 mmol/L (ref 135–145)
Sodium: 141 mmol/L (ref 135–145)
Sodium: 141 mmol/L (ref 135–145)
Sodium: 141 mmol/L (ref 135–145)
TCO2: 48 mmol/L — ABNORMAL HIGH (ref 22–32)
TCO2: 48 mmol/L — ABNORMAL HIGH (ref 22–32)
TCO2: 46 mmol/L — ABNORMAL HIGH (ref 22–32)
TCO2: 47 mmol/L — ABNORMAL HIGH (ref 22–32)
pCO2 arterial: 80.9 mmHg (ref 32–48)
pCO2 arterial: 59.4 mmHg — ABNORMAL HIGH (ref 32–48)
pCO2 arterial: 57 mmHg — ABNORMAL HIGH (ref 32–48)
pCO2 arterial: 60.2 mmHg — ABNORMAL HIGH (ref 32–48)
pH, Arterial: 7.366 (ref 7.35–7.45)
pH, Arterial: 7.498 — ABNORMAL HIGH (ref 7.35–7.45)
pH, Arterial: 7.506 — ABNORMAL HIGH (ref 7.35–7.45)
pH, Arterial: 7.495 — ABNORMAL HIGH (ref 7.35–7.45)
pO2, Arterial: 76 mmHg — ABNORMAL LOW (ref 83–108)
pO2, Arterial: 47 mmHg — ABNORMAL LOW (ref 83–108)
pO2, Arterial: 57 mmHg — ABNORMAL LOW (ref 83–108)
pO2, Arterial: 64 mmHg — ABNORMAL LOW (ref 83–108)

## 2021-08-31 LAB — TSH: TSH: 0.757 u[IU]/mL (ref 0.350–4.500)

## 2021-08-31 LAB — ECHOCARDIOGRAM COMPLETE
Area-P 1/2: 3.15 cm2
Calc EF: 54.5 %
Height: 77 in
S' Lateral: 3.2 cm
Single Plane A2C EF: 54.8 %
Single Plane A4C EF: 56.4 %
Weight: 7504 oz

## 2021-08-31 LAB — GLUCOSE, CAPILLARY
Glucose-Capillary: 137 mg/dL — ABNORMAL HIGH (ref 70–99)
Glucose-Capillary: 134 mg/dL — ABNORMAL HIGH (ref 70–99)
Glucose-Capillary: 154 mg/dL — ABNORMAL HIGH (ref 70–99)
Glucose-Capillary: 133 mg/dL — ABNORMAL HIGH (ref 70–99)
Glucose-Capillary: 178 mg/dL — ABNORMAL HIGH (ref 70–99)
Glucose-Capillary: 197 mg/dL — ABNORMAL HIGH (ref 70–99)

## 2021-08-31 LAB — BASIC METABOLIC PANEL
Anion gap: 13 (ref 5–15)
BUN: 9 mg/dL (ref 6–20)
CO2: 41 mmol/L — ABNORMAL HIGH (ref 22–32)
Calcium: 8.9 mg/dL (ref 8.9–10.3)
Chloride: 89 mmol/L — ABNORMAL LOW (ref 98–111)
Creatinine, Ser: 0.9 mg/dL (ref 0.61–1.24)
GFR, Estimated: >60 mL/min (ref >60)
Glucose, Bld: 106 mg/dL — ABNORMAL HIGH (ref 70–99)
Potassium: 4.4 mmol/L (ref 3.5–5.1)
Sodium: 143 mmol/L (ref 135–145)

## 2021-08-31 LAB — T4, FREE: Free T4: 0.92 ng/dL (ref 0.61–1.12)

## 2021-08-31 LAB — MAGNESIUM
Magnesium: 1.7 mg/dL (ref 1.7–2.4)
Magnesium: 2.2 mg/dL (ref 1.7–2.4)

## 2021-08-31 LAB — LACTIC ACID, PLASMA: Lactic Acid, Venous: 1.9 mmol/L (ref 0.5–1.9)

## 2021-08-31 LAB — PROCALCITONIN: Procalcitonin: 0.13 ng/mL

## 2021-08-31 LAB — PHOSPHORUS
Phosphorus: 6.8 mg/dL — ABNORMAL HIGH (ref 2.5–4.6)
Phosphorus: 2.3 mg/dL — ABNORMAL LOW (ref 2.5–4.6)

## 2021-08-31 MED ORDER — FENTANYL BOLUS VIA INFUSION
50.0000 ug | INTRAVENOUS | Status: DC | PRN
  Administered 2021-09-02 (×2): 50 ug via INTRAVENOUS
  Administered 2021-09-02 – 2021-09-03 (×3): 100 ug via INTRAVENOUS
  Administered 2021-09-07 (×3): 50 ug via INTRAVENOUS
  Administered 2021-09-08: 100 ug via INTRAVENOUS

## 2021-08-31 MED ORDER — AMIODARONE HCL IN DEXTROSE 360-4.14 MG/200ML-% IV SOLN
60.0000 mg/h | INTRAVENOUS | Status: DC
  Filled 2021-08-31: qty 200

## 2021-08-31 MED ORDER — NOREPINEPHRINE 16 MG/250ML-% IV SOLN
0.0000 ug/min | INTRAVENOUS | Status: DC
  Administered 2021-08-31: 9 ug/min via INTRAVENOUS
  Administered 2021-09-01: 10 ug/min via INTRAVENOUS
  Administered 2021-09-02: 24 ug/min via INTRAVENOUS
  Administered 2021-09-03: 10 ug/min via INTRAVENOUS
  Administered 2021-09-03: 44 ug/min via INTRAVENOUS
  Administered 2021-09-03: 45 ug/min via INTRAVENOUS
  Filled 2021-08-31 (×6): qty 250

## 2021-08-31 MED ORDER — LIDOCAINE HCL (PF) 1 % IJ SOLN
INTRAMUSCULAR | Status: AC
  Administered 2021-08-31: 10 mL
  Filled 2021-08-31: qty 30

## 2021-08-31 MED ORDER — NOREPINEPHRINE 4 MG/250ML-% IV SOLN
2.0000 ug/min | INTRAVENOUS | Status: DC
  Administered 2021-08-31: 15 ug/min via INTRAVENOUS
  Filled 2021-08-31: qty 250

## 2021-08-31 MED ORDER — AMIODARONE HCL IN DEXTROSE 360-4.14 MG/200ML-% IV SOLN
INTRAVENOUS | Status: AC
  Administered 2021-08-31: 150 mg via INTRAVENOUS
  Filled 2021-08-31: qty 200

## 2021-08-31 MED ORDER — STERILE WATER FOR INJECTION IJ SOLN
INTRAMUSCULAR | Status: AC
  Filled 2021-08-31: qty 10

## 2021-08-31 MED ORDER — AMIODARONE LOAD VIA INFUSION
150.0000 mg | Freq: Once | INTRAVENOUS | Status: AC
  Filled 2021-08-31: qty 83.34

## 2021-08-31 MED ORDER — PROSOURCE TF PO LIQD
90.0000 mL | Freq: Four times a day (QID) | ORAL | Status: DC
  Administered 2021-08-31 – 2021-09-05 (×21): 90 mL
  Filled 2021-08-31 (×21): qty 90

## 2021-08-31 MED ORDER — FENTANYL CITRATE PF 50 MCG/ML IJ SOSY
PREFILLED_SYRINGE | INTRAMUSCULAR | Status: AC
  Administered 2021-08-31: 200 ug via INTRAVENOUS
  Filled 2021-08-31: qty 4

## 2021-08-31 MED ORDER — MIDAZOLAM HCL 2 MG/2ML IJ SOLN
INTRAMUSCULAR | Status: AC
  Administered 2021-08-31: 4 mg
  Filled 2021-08-31: qty 4

## 2021-08-31 MED ORDER — FUROSEMIDE 10 MG/ML IJ SOLN
4.0000 mg/h | INTRAVENOUS | Status: DC
  Administered 2021-08-31 – 2021-09-02 (×3): 8 mg/h via INTRAVENOUS
  Filled 2021-08-31 (×3): qty 20

## 2021-08-31 MED ORDER — ADENOSINE 6 MG/2ML IV SOLN
INTRAVENOUS | Status: AC
  Filled 2021-08-31: qty 2

## 2021-08-31 MED ORDER — VITAL 1.5 CAL PO LIQD
1000.0000 mL | ORAL | Status: DC
  Administered 2021-08-31 – 2021-09-04 (×5): 1000 mL
  Filled 2021-08-31: qty 1000

## 2021-08-31 MED ORDER — POLYETHYLENE GLYCOL 3350 17 G PO PACK: 17.0000 g | PACK | Freq: Every day | ORAL | Status: DC

## 2021-08-31 MED ORDER — ROCURONIUM BROMIDE 10 MG/ML (PF) SYRINGE
PREFILLED_SYRINGE | INTRAVENOUS | Status: AC
  Administered 2021-08-31: 100 mg
  Filled 2021-08-31: qty 10

## 2021-08-31 MED ORDER — AMIODARONE HCL IN DEXTROSE 360-4.14 MG/200ML-% IV SOLN
60.0000 mg/h | INTRAVENOUS | Status: DC
  Administered 2021-09-02 – 2021-09-03 (×2): 60 mg/h via INTRAVENOUS
  Filled 2021-08-31 (×3): qty 200

## 2021-08-31 MED ORDER — STERILE WATER FOR INJECTION IJ SOLN
50.0000 ng/kg/min | INTRAMUSCULAR | Status: DC
  Administered 2021-08-31 – 2021-09-02 (×8): 50 ng/kg/min via RESPIRATORY_TRACT
  Filled 2021-08-31 (×18): qty 5

## 2021-08-31 MED ORDER — DOCUSATE SODIUM 50 MG/5ML PO LIQD: 100.0000 mg | Freq: Two times a day (BID) | ORAL | Status: DC

## 2021-08-31 MED ORDER — DOCUSATE SODIUM 50 MG/5ML PO LIQD
100.0000 mg | Freq: Two times a day (BID) | ORAL | Status: DC
  Administered 2021-08-31 – 2021-09-01 (×4): 100 mg
  Filled 2021-08-31 (×4): qty 10

## 2021-08-31 MED ORDER — NOREPINEPHRINE 4 MG/250ML-% IV SOLN
INTRAVENOUS | Status: AC
  Filled 2021-08-31: qty 250

## 2021-08-31 MED ORDER — FENTANYL CITRATE PF 50 MCG/ML IJ SOSY: 50.0000 ug | PREFILLED_SYRINGE | Freq: Once | INTRAMUSCULAR | Status: DC

## 2021-08-31 MED ORDER — POLYETHYLENE GLYCOL 3350 17 G PO PACK
17.0000 g | PACK | Freq: Every day | ORAL | Status: DC
  Administered 2021-08-31 – 2021-09-07 (×5): 17 g
  Filled 2021-08-31 (×5): qty 1

## 2021-08-31 MED ORDER — DEXMEDETOMIDINE HCL IN NACL 400 MCG/100ML IV SOLN
0.0000 ug/kg/h | INTRAVENOUS | Status: DC
  Administered 2021-08-31: 0.4 ug/kg/h via INTRAVENOUS
  Administered 2021-08-31 – 2021-09-01 (×7): 0.8 ug/kg/h via INTRAVENOUS
  Administered 2021-09-01: 0.9 ug/kg/h via INTRAVENOUS
  Administered 2021-09-01 (×2): 0.8 ug/kg/h via INTRAVENOUS
  Administered 2021-09-01: 0.9 ug/kg/h via INTRAVENOUS
  Administered 2021-09-01: 0.8 ug/kg/h via INTRAVENOUS
  Administered 2021-09-01: 0.9 ug/kg/h via INTRAVENOUS
  Administered 2021-09-01 (×3): 0.8 ug/kg/h via INTRAVENOUS
  Administered 2021-09-02 (×2): 0.9 ug/kg/h via INTRAVENOUS
  Administered 2021-09-02: 0.7 ug/kg/h via INTRAVENOUS
  Administered 2021-09-02: 0.9 ug/kg/h via INTRAVENOUS
  Filled 2021-08-31 (×2): qty 100
  Filled 2021-08-31: qty 200
  Filled 2021-08-31 (×3): qty 100
  Filled 2021-08-31: qty 200
  Filled 2021-08-31 (×12): qty 100

## 2021-08-31 MED ORDER — MAGNESIUM SULFATE 2 GM/50ML IV SOLN
2.0000 g | Freq: Once | INTRAVENOUS | Status: AC
Start: 2021-08-31 — End: 2021-08-31
  Administered 2021-08-31: 2 g via INTRAVENOUS
  Filled 2021-08-31: qty 50

## 2021-08-31 MED ORDER — FENTANYL CITRATE PF 50 MCG/ML IJ SOSY: 50.0000 ug | PREFILLED_SYRINGE | INTRAMUSCULAR | Status: DC | PRN

## 2021-08-31 MED ORDER — FENTANYL CITRATE PF 50 MCG/ML IJ SOSY
50.0000 ug | PREFILLED_SYRINGE | INTRAMUSCULAR | Status: DC | PRN
  Administered 2021-08-31: 200 ug via INTRAVENOUS
  Filled 2021-08-31: qty 4

## 2021-08-31 MED ORDER — PERFLUTREN LIPID MICROSPHERE
1.0000 mL | INTRAVENOUS | Status: AC | PRN
  Administered 2021-08-31: 2 mL via INTRAVENOUS

## 2021-08-31 MED ORDER — SODIUM CHLORIDE 0.9 % IV SOLN
250.0000 mL | INTRAVENOUS | Status: DC
  Administered 2021-08-31: 250 mL via INTRAVENOUS
  Administered 2021-09-01: 500 mL via INTRAVENOUS
  Administered 2021-09-03: 250 mL via INTRAVENOUS
  Administered 2021-09-04: 500 mL via INTRAVENOUS
  Administered 2021-09-05: 250 mL via INTRAVENOUS

## 2021-08-31 MED ORDER — FENTANYL 2500MCG IN NS 250ML (10MCG/ML) PREMIX INFUSION
50.0000 ug/h | INTRAVENOUS | Status: DC
  Administered 2021-08-31: 125 ug/h via INTRAVENOUS
  Administered 2021-08-31: 100 ug/h via INTRAVENOUS
  Administered 2021-09-01 – 2021-09-02 (×3): 150 ug/h via INTRAVENOUS
  Administered 2021-09-03 (×4): 400 ug/h via INTRAVENOUS
  Administered 2021-09-04 (×3): 300 ug/h via INTRAVENOUS
  Administered 2021-09-05: 250 ug/h via INTRAVENOUS
  Administered 2021-09-05: 300 ug/h via INTRAVENOUS
  Administered 2021-09-05: 250 ug/h via INTRAVENOUS
  Administered 2021-09-06: 100 ug/h via INTRAVENOUS
  Administered 2021-09-07: 200 ug/h via INTRAVENOUS
  Administered 2021-09-07: 75 ug/h via INTRAVENOUS
  Administered 2021-09-08: 200 ug/h via INTRAVENOUS
  Filled 2021-08-31 (×22): qty 250

## 2021-08-31 MED ORDER — PANTOPRAZOLE 2 MG/ML SUSPENSION: 40.0000 mg | Freq: Every day | ORAL | Status: DC

## 2021-08-31 NOTE — Progress Notes (Signed)
Pt currently has 20 G X 2.5 ultrasound sound guided I.V. available for pressors, RN aware.

## 2021-08-31 NOTE — Procedures (Signed)
Arterial Catheter Insertion Procedure Note  Joe Carter  [id number]  05/30/1980  Date:08/31/21  Time:6:35 AM    Provider Performing: Julian Hy    Procedure: Insertion of Arterial Line [phone]) with US guidance (90092)   Indication(s) Blood pressure monitoring and/or need for frequent ABGs  Consent Unable to obtain consent due to emergent nature of procedure.  Anesthesia 2cc 1% lidocaine   Time Out Verified patient identification, verified procedure, site/side was marked, verified correct patient position, special equipment/implants available, medications/allergies/relevant history reviewed, required imaging and test results available.   Sterile Technique Maximal sterile technique including full sterile barrier drape, hand hygiene, sterile gown, sterile gloves, mask, hair covering, sterile ultrasound probe cover (if used).   Procedure Description Area of catheter insertion was cleaned with chlorhexidine and draped in sterile fashion. With real-time ultrasound guidance an arterial catheter was placed into the left radial artery.  Appropriate arterial tracings confirmed on monitor.     Complications/Tolerance None; patient tolerated the procedure well.   EBL Minimal   Specimen(s) None  Julian Hy, DO 08/31/21 6:35 AM [facility]

## 2021-08-31 NOTE — Progress Notes (Signed)
eLink Physician-Brief Progress Note Patient Name: Joe Carter DOB: 1980/12/24 MRN: [id number]   Date of Service  08/31/2021  HPI/Events of Note  AFIB with RVR - Ventricular rate = 188. SBP = 90's. K+ = 4.4 and Mg++ = 1.7. Sat = 85% on BiPAP.  eICU Interventions  Plan: Amiodarone IV load and infusion.  Will replace Mg++. Portable CXR STAT. Will request that the PCCM ground team evaluate the patient at bedside.      Intervention Category Major Interventions: Arrhythmia - evaluation and management  Roman Sandall Cornelia Copa 08/31/2021, 5:33 AM

## 2021-08-31 NOTE — Assessment & Plan Note (Signed)
Currently well controlled on sliding scale insulin.

## 2021-08-31 NOTE — Procedures (Signed)
Intubation Procedure Note  Joe Carter  [id number]  1980/06/17  Date:08/31/21  Time:6:10 AM   Provider Performing:Haylee Mcanany P Carlis Abbott    Procedure: Intubation (84665)  Indication(s) Respiratory Failure  Consent Risks of the procedure as well as the alternatives and risks of each were explained to the patient and/or caregiver.  Consent for the procedure was obtained and is signed in the bedside chart   Anesthesia Versed, Fentanyl, and Rocuronium   Time Out Verified patient identification, verified procedure, site/side was marked, verified correct patient position, special equipment/implants available, medications/allergies/relevant history reviewed, required imaging and test results available.   Sterile Technique Usual hand hygeine, masks, and gloves were used   Procedure Description Patient positioned in bed supine.  Sedation given as noted above.  Patient was intubated with endotracheal tube using Glidescope.  View was Grade 1 full glottis .  Number of attempts was 1.  Colorimetric CO2 detector was consistent with tracheal placement.   OGT placed after intubation and ETT secured.  Complications/Tolerance None; patient tolerated the procedure well. Chest X-ray is ordered to verify placement.   EBL 0    Specimen(s) None  Julian Hy, DO 08/31/21 6:11 AM [facility]

## 2021-08-31 NOTE — Progress Notes (Signed)
Initial Nutrition Assessment  DOCUMENTATION CODES:   Not applicable  INTERVENTION:   Initiate tube feeds via OG tube: - Start Vital 1.5 @ 40 ml/hr and advance by 10 ml q 6 hours to goal rate of 60 ml/hr (1440 ml/day) - ProSource TF 90 ml QID  Tube feeding regimen at goal rate provides 2480 kcal, 185 grams of protein, and 1100 ml of H2O.   NUTRITION DIAGNOSIS:   Inadequate oral intake related to inability to eat as evidenced by NPO status.  GOAL:   Provide needs based on ASPEN/SCCM guidelines  MONITOR:   Vent status, Labs, Weight trends, TF tolerance, I & O's  REASON FOR ASSESSMENT:   Ventilator, Consult Enteral/tube feeding initiation and management  ASSESSMENT:   41 year old male who presented to the ED on 6/07 with SOB and LE edema. PMH of CHF (noncompliant with diuretics), OHS, OSA, chronic respiratory failure on supplemental O2. Pt admitted with acute on chronic respiratory failure, acute on chronic CHF.  06/09 - intubated  Consult received for enteral nutrition initiation and management. Pt with OG tube in stomach per abdominal x-ray.  Unable to obtain diet and weight history at this time. Reviewed weight history in chart. Weight has fluctuated significantly over the last year between 203-232 kg. Suspect weight fluctuations are related to volume status given CHF and pt's noncompliance with diuretics. Pt currently with moderate pitting edema to BLE. Unsure of pt's true dry weight.  Patient is currently intubated on ventilator support MV: 14.2 L/min Temp (24hrs), Avg:99.4 F (37.4 C), Min:97 F (36.1 C), Max:100.7 F (38.2 C) BP (a-line): 124/67 MAP (a-line): 85  Drips: Fentanyl Precedex Levophed Lasix  Medications reviewed and include: colace, SSI q 4 hours, IV protonix, miralax, IV magnesium sulfate 2 grams once  Labs reviewed: ionized calcium 1.11, phosphorus 6.8, WBC 13.9 CBG's: 83-143 x 24 hours  UOP: 4650 ml x 24 hours I/O's: -5.5 L since  admit  NUTRITION - FOCUSED PHYSICAL EXAM:  Flowsheet Row Most Recent Value  Orbital Region No depletion  Upper Arm Region No depletion  Thoracic and Lumbar Region No depletion  Buccal Region Unable to assess  Temple Region No depletion  Clavicle Bone Region No depletion  Clavicle and Acromion Bone Region No depletion  Scapular Bone Region No depletion  Dorsal Hand No depletion  Patellar Region No depletion  Anterior Thigh Region No depletion  Posterior Calf Region No depletion  Edema (RD Assessment) Moderate  [BLE]  Hair Reviewed  Eyes Reviewed  Mouth Reviewed  Skin Reviewed  Nails Reviewed       Diet Order:   Diet Order             Diet NPO time specified  Diet effective now                   EDUCATION NEEDS:   Not appropriate for education at this time  Skin:  Skin Assessment: Reviewed RN Assessment  Last BM:  no documented BM  Height:   Ht Readings from Last 1 Encounters:  09/04/2021 _0  (1.956 m)    Weight:   Wt Readings from Last 1 Encounters:  08/31/21 (!) 212.7 kg    Ideal Body Weight:  94.5 kg  BMI:  Body mass index is 55.62 kg/m.  Estimated Nutritional Needs:   Kcal:  2200-2400  Protein:  185-200 grams  Fluid:  2.0 L    Gustavus Bryant, MS, RD, LDN Inpatient Clinical Dietitian Please see AMiON for contact information.

## 2021-08-31 NOTE — Assessment & Plan Note (Addendum)
Echo from 02/2021 shows normal EF.  Now POC shows moderate RV dysfunction.  - 5 L fluid balance  - Continue diuresis, start furosemide infusion.  - RV function should improve with mechanical ventilation and diuresis. Consider repeat limited echo prior to discharge.  - Follow CVP and decrease furosemide infusion as edema improved.

## 2021-08-31 NOTE — Assessment & Plan Note (Signed)
Mild leukocytosis, patient is in distress without significant edema.  Still febrile WBC and GPC on sputum CXR - bibasilar infiltrates  Patient is now febrile and leukocytosis to 15.9 (increasing)  - re-cultured and coverage broadened to cefepime and vancomycin

## 2021-08-31 NOTE — Assessment & Plan Note (Addendum)
Oxygenation remains marginal despite epoprostenol.  Morbid obesity make recorded airway pressures inaccurate reflection of transpulmonary pressure.   - No improvement in oxygenation with further increase in PEEP beyond 15cmH2O - Wean FiO2 as tolerated.  - Have cleared pCO2 adequately - Trend ABG.  - Stop epoprostenol.
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
## 2021-08-31 NOTE — Progress Notes (Signed)
  Echocardiogram 2D Echocardiogram has been performed.  Joe Carter 08/31/2021, 2:49 PM

## 2021-08-31 NOTE — Assessment & Plan Note (Signed)
Morbid obesity driving current respiratory decompensation.   - Hypocaloric feeds, maintain protein intake. - Would benefit from esophageal manometry to estimate actual transpulmonary pressure.

## 2021-08-31 NOTE — Assessment & Plan Note (Signed)
Currently comfortable on fentanyl and dexmedetomidine   - Titrate sedation to RASS of -2.

## 2021-08-31 NOTE — Progress Notes (Addendum)
PCCM interval progress note:  Afternoon ABG with pO2 of 47 down from 76, FiO2 increased from 80 to 100%.  Levophed requirement down from 18mg to 957m.  Discussed with Dr. AgLynetta Marend will start Flolan. Check Coox  LaOtilio Carpenleason, PA-C

## 2021-08-31 NOTE — Procedures (Signed)
Central Venous Catheter Insertion Procedure Note  EVERT WENRICH  [id number]  04-Mar-1981  Date:08/31/21  Time:7:50 AM   Provider Performing:Antwoin Lackey Jerilynn Mages Ayesha Rumpf   Procedure: Insertion of Non-tunneled Central Venous Catheter(36556) with US guidance (74718)   Indication(s) Medication administration and Difficult access  Consent Unable to obtain consent due to emergent nature of procedure.  Anesthesia Topical only with 1% lidocaine   Timeout Verified patient identification, verified procedure, site/side was marked, verified correct patient position, special equipment/implants available, medications/allergies/relevant history reviewed, required imaging and test results available.  Sterile Technique Maximal sterile technique including full sterile barrier drape, hand hygiene, sterile gown, sterile gloves, mask, hair covering, sterile ultrasound probe cover (if used).  Procedure Description Area of catheter insertion was cleaned with chlorhexidine and draped in sterile fashion.  With real-time ultrasound guidance a central venous catheter was placed into the left internal jugular vein. Nonpulsatile blood flow and easy flushing noted in all ports.  The catheter was sutured in place and sterile dressing applied.  Complications/Tolerance None; patient tolerated the procedure well. Chest X-ray is ordered to verify placement for internal jugular or subclavian cannulation.   Chest x-ray is not ordered for femoral cannulation.  EBL Minimal  Specimen(s) None  Lestine Mount, PA-C [facility] 08/31/21 7:50 AM  Please see [email or web address] for pager details.  From 7A-7P if no response, please call [phone] After hours, please call ELink [phone]

## 2021-09-01 ENCOUNTER — Inpatient Hospital Stay (HOSPITAL_COMMUNITY): Payer: 59

## 2021-09-01 DIAGNOSIS — J189 Pneumonia, unspecified organism: Secondary | ICD-10-CM | POA: Diagnosis not present

## 2021-09-01 DIAGNOSIS — J9621 Acute and chronic respiratory failure with hypoxia: Secondary | ICD-10-CM | POA: Diagnosis not present

## 2021-09-01 LAB — COMPREHENSIVE METABOLIC PANEL
ALT: 12 U/L (ref 0–44)
AST: 16 U/L (ref 15–41)
Albumin: 2.8 g/dL — ABNORMAL LOW (ref 3.5–5.0)
Alkaline Phosphatase: 55 U/L (ref 38–126)
Anion gap: 11 (ref 5–15)
BUN: 29 mg/dL — ABNORMAL HIGH (ref 6–20)
CO2: 38 mmol/L — ABNORMAL HIGH (ref 22–32)
Calcium: 8.7 mg/dL — ABNORMAL LOW (ref 8.9–10.3)
Chloride: 92 mmol/L — ABNORMAL LOW (ref 98–111)
Creatinine, Ser: 1.33 mg/dL — ABNORMAL HIGH (ref 0.61–1.24)
GFR, Estimated: >60 mL/min (ref >60)
Glucose, Bld: 190 mg/dL — ABNORMAL HIGH (ref 70–99)
Potassium: 3.8 mmol/L (ref 3.5–5.1)
Sodium: 141 mmol/L (ref 135–145)
Total Bilirubin: 3.2 mg/dL — ABNORMAL HIGH (ref 0.3–1.2)
Total Protein: 7.4 g/dL (ref 6.5–8.1)

## 2021-09-01 LAB — POCT I-STAT 7, (LYTES, BLD GAS, ICA,H+H)
Acid-Base Excess: 17 mmol/L — ABNORMAL HIGH (ref 0.0–2.0)
Bicarbonate: 42.6 mmol/L — ABNORMAL HIGH (ref 20.0–28.0)
Calcium, Ion: 1.1 mmol/L — ABNORMAL LOW (ref 1.15–1.40)
HCT: 55 % — ABNORMAL HIGH (ref 39.0–52.0)
Hemoglobin: 18.7 g/dL — ABNORMAL HIGH (ref 13.0–17.0)
O2 Saturation: 91 %
Patient temperature: 101
Potassium: 4.1 mmol/L (ref 3.5–5.1)
Sodium: 141 mmol/L (ref 135–145)
TCO2: 44 mmol/L — ABNORMAL HIGH (ref 22–32)
pCO2 arterial: 52.3 mmHg — ABNORMAL HIGH (ref 32–48)
pH, Arterial: 7.523 — ABNORMAL HIGH (ref 7.35–7.45)
pO2, Arterial: 59 mmHg — ABNORMAL LOW (ref 83–108)

## 2021-09-01 LAB — CBC
HCT: 52.8 % — ABNORMAL HIGH (ref 39.0–52.0)
Hemoglobin: 16 g/dL (ref 13.0–17.0)
MCH: 29.1 pg (ref 26.0–34.0)
MCHC: 30.3 g/dL (ref 30.0–36.0)
MCV: 96 fL (ref 80.0–100.0)
Platelets: 239 10*3/uL (ref 150–400)
RBC: 5.5 MIL/uL (ref 4.22–5.81)
RDW: 15.9 % — ABNORMAL HIGH (ref 11.5–15.5)
WBC: 15.9 10*3/uL — ABNORMAL HIGH (ref 4.0–10.5)
nRBC: 0 % (ref 0.0–0.2)

## 2021-09-01 LAB — COOXEMETRY PANEL
Carboxyhemoglobin: 2.2 % — ABNORMAL HIGH (ref 0.5–1.5)
Methemoglobin: 0.8 % (ref 0.0–1.5)
O2 Saturation: 93.2 %
Total hemoglobin: 16.7 g/dL — ABNORMAL HIGH (ref 12.0–16.0)

## 2021-09-01 LAB — GLUCOSE, CAPILLARY
Glucose-Capillary: 196 mg/dL — ABNORMAL HIGH (ref 70–99)
Glucose-Capillary: 178 mg/dL — ABNORMAL HIGH (ref 70–99)
Glucose-Capillary: 235 mg/dL — ABNORMAL HIGH (ref 70–99)
Glucose-Capillary: 175 mg/dL — ABNORMAL HIGH (ref 70–99)
Glucose-Capillary: 204 mg/dL — ABNORMAL HIGH (ref 70–99)

## 2021-09-01 LAB — PHOSPHORUS
Phosphorus: 2.5 mg/dL (ref 2.5–4.6)
Phosphorus: 2 mg/dL — ABNORMAL LOW (ref 2.5–4.6)
Phosphorus: 4.4 mg/dL (ref 2.5–4.6)

## 2021-09-01 LAB — MAGNESIUM
Magnesium: 1.9 mg/dL (ref 1.7–2.4)
Magnesium: 2.1 mg/dL (ref 1.7–2.4)
Magnesium: 2.2 mg/dL (ref 1.7–2.4)

## 2021-09-01 LAB — PROCALCITONIN: Procalcitonin: 11.07 ng/mL

## 2021-09-01 MED ORDER — DEXTROSE 5 % IV SOLN
15.0000 mmol | Freq: Once | INTRAVENOUS | Status: AC
  Administered 2021-09-01: 15 mmol via INTRAVENOUS
  Filled 2021-09-01: qty 5

## 2021-09-01 MED ORDER — ACETAMINOPHEN 160 MG/5ML PO SOLN
650.0000 mg | ORAL | Status: DC | PRN
  Administered 2021-09-01 – 2021-09-07 (×19): 650 mg
  Filled 2021-09-01 (×19): qty 20.3

## 2021-09-01 MED ORDER — ORAL CARE MOUTH RINSE: 15.0000 mL | OROMUCOSAL | Status: DC

## 2021-09-01 MED ORDER — SODIUM CHLORIDE 0.9 % IV SOLN
2.0000 g | Freq: Once | INTRAVENOUS | Status: AC
Start: 2021-09-01 — End: 2021-09-01
  Administered 2021-09-01: 2 g via INTRAVENOUS
  Filled 2021-09-01: qty 20

## 2021-09-01 MED ORDER — CHLORHEXIDINE GLUCONATE 0.12% ORAL RINSE (MEDLINE KIT)
15.0000 mL | Freq: Two times a day (BID) | OROMUCOSAL | Status: DC
  Administered 2021-09-02 – 2021-09-08 (×13): 15 mL via OROMUCOSAL

## 2021-09-01 MED ORDER — ACETAZOLAMIDE SODIUM 500 MG IJ SOLR
250.0000 mg | Freq: Two times a day (BID) | INTRAMUSCULAR | Status: DC
  Administered 2021-09-01 – 2021-09-02 (×3): 250 mg via INTRAVENOUS
  Filled 2021-09-01 (×3): qty 250

## 2021-09-01 MED ORDER — SODIUM CHLORIDE 0.9 % IV SOLN
2.0000 g | INTRAVENOUS | Status: DC
  Filled 2021-09-01: qty 20

## 2021-09-01 MED ORDER — ORAL CARE MOUTH RINSE
15.0000 mL | OROMUCOSAL | Status: DC
  Administered 2021-09-01 – 2021-09-08 (×66): 15 mL via OROMUCOSAL

## 2021-09-01 MED ORDER — STERILE WATER FOR INJECTION IJ SOLN
INTRAMUSCULAR | Status: AC
  Administered 2021-09-01: 2.5 mL
  Filled 2021-09-01: qty 10

## 2021-09-01 MED ORDER — STERILE WATER FOR INJECTION IJ SOLN
INTRAMUSCULAR | Status: AC
  Administered 2021-09-01: 10 mL
  Filled 2021-09-01: qty 10

## 2021-09-01 NOTE — Progress Notes (Signed)
Large BP cuff not large enough for patient's arm so BP readings inconsistent, Aline is positional, readings do not correlate well, moved BP cuff to left calf where it is able to get consistent measurements but still not correlating well with aline, will trend both pressure readings.

## 2021-09-01 NOTE — Progress Notes (Addendum)
NAME:  Joe Carter, MRN:  [id number], DOB:  1980/11/10, LOS: 3 ADMISSION DATE:  08/27/2021, CONSULTATION DATE:  09/21/2021 REFERRING MD:  Caryl Ada PA-C/ ED, CHIEF COMPLAINT:  acute respiratory failure   History of Present Illness:  [name] is a 41 year old gentleman with a history of OHS, OSA, chronic respiratory failure on 3 L supplemental oxygen who presented to internal medicine clinic with dyspnea on exertion, orthopnea, worsening edema.  He had been restarted on his Lasix in May 2023 due to severe lower extremity edema.  He had not been taking this due to frequent urination that prohibit him from completing tasks at work.  In the internal medicine clinic he was saturating in the low 80s, not responsive to his home oxygen being resumed.  He was transferred to the ED.  Upon arrival he was increased supplemental oxygen, between nonrebreather for his saturations were in the 90s.  ABG with significant hypercapnia, so he was started on BiPAP.  Earlier he ripped off BiPAP and removed his IVs, attempting to leave.  When he pulled off his oxygen he desatted to the 80s again and started to feel poorly.  Currently he has BiPAP back on.  Due to chest x-ray findings concerning for multifocal pneumonia he was given ceftriaxone and azithromycin.  He was given a dose of Lasix in the ED.  PCCM was consulted for evaluation of hypoxic and hypercapnic respiratory failure.  In December 2022 he was admitted for similar presentation requiring intubation and mechanical ventilation.  Pertinent  Medical History  OSA, OHS Chronic respiratory failure HFpEF  Significant Hospital Events: Including procedures, antibiotic start and stop dates in addition to other pertinent events   6/7 admission to the ICU 6/8 intubated.  6/9 still hypoxic - started on epoprostenol 6/10 fevers O2 still marginal  Interim History / Subjective:  Comfortable on current sedation level. Still marginal oxygenation.   Objective   Blood  pressure (!) 79/64, pulse 73, temperature (!) 103.2 F (39.6 C), temperature source Oral, resp. rate (!) 23, height 6' 5" (1.956 m), weight (!) 205 kg, SpO2 92 %.    Vent Mode: PRVC FiO2 (%):  [80 %-100 %] 100 % Set Rate:  [20 bmp-24 bmp] 22 bmp Vt Set:  [620 mL] 620 mL PEEP:  [15 cmH20] 15 cmH20 Plateau Pressure:  [30 cmH20-35 cmH20] 32 cmH20   Intake/Output Summary (Last 24 hours) at 09/01/2021 0757 Last data filed at 09/01/2021 0700 Gross per 24 hour  Intake 2386.56 ml  Output 2965 ml  Net -578.44 ml    Filed Weights   08/30/21 0415 08/31/21 0434 09/01/21 0500  Weight: (!) 203 kg (!) 212.7 kg (!) 205 kg     General:  obese, critically ill-appearing M, intubated and sedated HEENT: MM pink/moist, sclera anicteric pupils equal Neuro: examined on sedation, spontaneous movement, but not awake or following commands CV: s1s2 rrr, no m/r/g PULM:  mechanical vent sounds bilaterally, requiring 100%, synchronous, equal chest rise GI: soft, non-distended Extremities: warm/dry, 1+ edema  Skin: no rashes or lesions   Assessment & Plan:   Stress hyperglycemia Currently well controlled on sliding scale insulin.   Acute heart failure with preserved ejection fraction (HFpEF) ([place]) Echo from 02/2021 shows normal EF.  Now POC shows moderate RV dysfunction.  - 5 L fluid balance  - Continue diuresis, start furosemide infusion.  - RV function should improve with mechanical ventilation and diuresis. Consider repeat limited echo prior to discharge.    Obesity with alveolar hypoventilation  and body mass index (BMI) of 40 or greater (HCC) Morbid obesity driving current respiratory decompensation.   - Hypocaloric feeds, maintain protein intake. - Would benefit from esophageal manometry to estimate actual transpulmonary pressure.   Acute metabolic encephalopathy Currently comfortable on fentanyl and dexmedetomidine   - Titrate sedation to RASS of -2.  Acute on chronic respiratory  failure with hypercapnia (HCC) Marginal oxygenation with P/F ratio 59  Hypercarbia resolving, now alkalotic Morbid obesity make recorded airway pressures inaccurate reflection of transpulmonary pressure.   - No improvement in oxygenation with further increase in PEEP - Wean FiO2 as tolerated.  - Have cleared pCO2 adequately - Trend ABG.  - Continue epoprostenol.  - Acetazolamide for metabolic alkalosis.   Community acquired pneumonia Mild leukocytosis, patient is in distress without significant edema.  CXR no clear infiltrate Patient is now febrile and leukocytosis to 15.9 (increasing)  - complete 7 days of empiric coverage for CAP   Best Practice (right click and "Reselect all SmartList Selections" daily)   Diet/type: tubefeeds DVT prophylaxis: LMWH GI prophylaxis: PPI Lines: Central line Foley:  Yes, and it is still needed Code Status:  full code Last date of multidisciplinary goals of care discussion [ pending, left VM for mother on 6/9}   CRITICAL CARE Performed by: Kipp Brood   Total critical care time: 40 minutes  Critical care time was exclusive of separately billable procedures and treating other patients.  Critical care was necessary to treat or prevent imminent or life-threatening deterioration.  Critical care was time spent personally by me on the following activities: development of treatment plan with patient and/or surrogate as well as nursing, discussions with consultants, evaluation of patient's response to treatment, examination of patient, obtaining history from patient or surrogate, ordering and performing treatments and interventions, ordering and review of laboratory studies, ordering and review of radiographic studies, pulse oximetry and re-evaluation of patient's condition.  Kipp Brood, MD [facility] ICU Physician [place]  Pager: [phone] Or Epic Secure Chat After hours: [phone].  09/02/2021, 11:42 AM

## 2021-09-01 NOTE — Progress Notes (Signed)
eLink Physician-Brief Progress Note Patient Name: Joe Carter DOB: 01-18-1981 MRN: [id number]   Date of Service  09/01/2021  HPI/Events of Note  Patient with fever of 103.3.  eICU Interventions  PRN order for Tylenol for fever entered.     Intervention Category Intermediate Interventions: Pain - evaluation and management  Bron Snellings U Jaelon Gatley 09/01/2021, 1:15 AM

## 2021-09-01 NOTE — Plan of Care (Signed)
  Problem: Education: Goal: Ability to describe self-care measures that may prevent or decrease complications (Diabetes Survival Skills Education) will improve Outcome: Progressing Goal: Individualized Educational Video(s) Outcome: Progressing   Problem: Coping: Goal: Ability to adjust to condition or change in health will improve Outcome: Progressing   Problem: Fluid Volume: Goal: Ability to maintain a balanced intake and output will improve Outcome: Progressing   Problem: Health Behavior/Discharge Planning: Goal: Ability to identify and utilize available resources and services will improve Outcome: Progressing Goal: Ability to manage health-related needs will improve Outcome: Progressing   Problem: Metabolic: Goal: Ability to maintain appropriate glucose levels will improve Outcome: Progressing   Problem: Nutritional: Goal: Maintenance of adequate nutrition will improve Outcome: Progressing Goal: Progress toward achieving an optimal weight will improve Outcome: Progressing   Problem: Health Behavior/Discharge Planning: Goal: Ability to manage health-related needs will improve Outcome: Progressing   Problem: Education: Goal: Knowledge of General Education information will improve Description: Including pain rating scale, medication(s)/side effects and non-pharmacologic comfort measures Outcome: Progressing

## 2021-09-02 ENCOUNTER — Inpatient Hospital Stay (HOSPITAL_COMMUNITY): Payer: 59

## 2021-09-02 DIAGNOSIS — J189 Pneumonia, unspecified organism: Secondary | ICD-10-CM | POA: Diagnosis not present

## 2021-09-02 DIAGNOSIS — I2609 Other pulmonary embolism with acute cor pulmonale: Secondary | ICD-10-CM

## 2021-09-02 DIAGNOSIS — J9621 Acute and chronic respiratory failure with hypoxia: Secondary | ICD-10-CM | POA: Diagnosis not present

## 2021-09-02 LAB — POCT I-STAT 7, (LYTES, BLD GAS, ICA,H+H)
Acid-Base Excess: 11 mmol/L — ABNORMAL HIGH (ref 0.0–2.0)
Acid-Base Excess: 7 mmol/L — ABNORMAL HIGH (ref 0.0–2.0)
Bicarbonate: 39.2 mmol/L — ABNORMAL HIGH (ref 20.0–28.0)
Bicarbonate: 38 mmol/L — ABNORMAL HIGH (ref 20.0–28.0)
Calcium, Ion: 1.13 mmol/L — ABNORMAL LOW (ref 1.15–1.40)
Calcium, Ion: 1.11 mmol/L — ABNORMAL LOW (ref 1.15–1.40)
HCT: 53 % — ABNORMAL HIGH (ref 39.0–52.0)
HCT: 51 % (ref 39.0–52.0)
Hemoglobin: 18 g/dL — ABNORMAL HIGH (ref 13.0–17.0)
Hemoglobin: 17.3 g/dL — ABNORMAL HIGH (ref 13.0–17.0)
O2 Saturation: 89 %
O2 Saturation: 60 %
Patient temperature: 39.3
Patient temperature: 38.2
Potassium: 4 mmol/L (ref 3.5–5.1)
Potassium: 3.8 mmol/L (ref 3.5–5.1)
Sodium: 143 mmol/L (ref 135–145)
Sodium: 145 mmol/L (ref 135–145)
TCO2: 41 mmol/L — ABNORMAL HIGH (ref 22–32)
TCO2: 40 mmol/L — ABNORMAL HIGH (ref 22–32)
pCO2 arterial: 65.9 mmHg (ref 32–48)
pCO2 arterial: 84.6 mmHg (ref 32–48)
pH, Arterial: 7.392 (ref 7.35–7.45)
pH, Arterial: 7.267 — ABNORMAL LOW (ref 7.35–7.45)
pO2, Arterial: 66 mmHg — ABNORMAL LOW (ref 83–108)
pO2, Arterial: 40 mmHg — CL (ref 83–108)

## 2021-09-02 LAB — BASIC METABOLIC PANEL
Anion gap: 12 (ref 5–15)
Anion gap: 12 (ref 5–15)
BUN: 35 mg/dL — ABNORMAL HIGH (ref 6–20)
BUN: 43 mg/dL — ABNORMAL HIGH (ref 6–20)
CO2: 34 mmol/L — ABNORMAL HIGH (ref 22–32)
CO2: 34 mmol/L — ABNORMAL HIGH (ref 22–32)
Calcium: 8.4 mg/dL — ABNORMAL LOW (ref 8.9–10.3)
Calcium: 7.9 mg/dL — ABNORMAL LOW (ref 8.9–10.3)
Chloride: 95 mmol/L — ABNORMAL LOW (ref 98–111)
Chloride: 100 mmol/L (ref 98–111)
Creatinine, Ser: 1.31 mg/dL — ABNORMAL HIGH (ref 0.61–1.24)
Creatinine, Ser: 1.49 mg/dL — ABNORMAL HIGH (ref 0.61–1.24)
GFR, Estimated: >60 mL/min (ref >60)
GFR, Estimated: 60 mL/min — ABNORMAL LOW (ref >60)
Glucose, Bld: 195 mg/dL — ABNORMAL HIGH (ref 70–99)
Glucose, Bld: 204 mg/dL — ABNORMAL HIGH (ref 70–99)
Potassium: 4 mmol/L (ref 3.5–5.1)
Potassium: 3.7 mmol/L (ref 3.5–5.1)
Sodium: 141 mmol/L (ref 135–145)
Sodium: 146 mmol/L — ABNORMAL HIGH (ref 135–145)

## 2021-09-02 LAB — APTT: aPTT: 34 seconds (ref 24–36)

## 2021-09-02 LAB — GLUCOSE, CAPILLARY
Glucose-Capillary: 176 mg/dL — ABNORMAL HIGH (ref 70–99)
Glucose-Capillary: 180 mg/dL — ABNORMAL HIGH (ref 70–99)
Glucose-Capillary: 184 mg/dL — ABNORMAL HIGH (ref 70–99)
Glucose-Capillary: 186 mg/dL — ABNORMAL HIGH (ref 70–99)
Glucose-Capillary: 190 mg/dL — ABNORMAL HIGH (ref 70–99)
Glucose-Capillary: 199 mg/dL — ABNORMAL HIGH (ref 70–99)
Glucose-Capillary: 200 mg/dL — ABNORMAL HIGH (ref 70–99)

## 2021-09-02 LAB — PROTIME-INR
INR: 1.3 — ABNORMAL HIGH (ref 0.8–1.2)
Prothrombin Time: 16.3 seconds — ABNORMAL HIGH (ref 11.4–15.2)

## 2021-09-02 LAB — COOXEMETRY PANEL
Carboxyhemoglobin: 1.6 % — ABNORMAL HIGH (ref 0.5–1.5)
Carboxyhemoglobin: 1.8 % — ABNORMAL HIGH (ref 0.5–1.5)
Methemoglobin: 0.8 % (ref 0.0–1.5)
Methemoglobin: 1.7 % — ABNORMAL HIGH (ref 0.0–1.5)
O2 Saturation: 92.9 %
O2 Saturation: 96.3 %
Total hemoglobin: 15.2 g/dL (ref 12.0–16.0)
Total hemoglobin: 15.8 g/dL (ref 12.0–16.0)

## 2021-09-02 LAB — PHOSPHORUS: Phosphorus: 4 mg/dL (ref 2.5–4.6)

## 2021-09-02 LAB — HEPARIN LEVEL (UNFRACTIONATED)
Heparin Unfractionated: <0.1 IU/mL — ABNORMAL LOW (ref 0.30–0.70)
Heparin Unfractionated: 0.13 IU/mL — ABNORMAL LOW (ref 0.30–0.70)

## 2021-09-02 LAB — CBC
HCT: 51.8 % (ref 39.0–52.0)
Hemoglobin: 16 g/dL (ref 13.0–17.0)
MCH: 29.9 pg (ref 26.0–34.0)
MCHC: 30.9 g/dL (ref 30.0–36.0)
MCV: 96.8 fL (ref 80.0–100.0)
Platelets: 213 10*3/uL (ref 150–400)
RBC: 5.35 MIL/uL (ref 4.22–5.81)
RDW: 16 % — ABNORMAL HIGH (ref 11.5–15.5)
WBC: 14.9 10*3/uL — ABNORMAL HIGH (ref 4.0–10.5)
nRBC: 0 % (ref 0.0–0.2)

## 2021-09-02 LAB — MAGNESIUM: Magnesium: 2.4 mg/dL (ref 1.7–2.4)

## 2021-09-02 LAB — MRSA NEXT GEN BY PCR, NASAL: MRSA by PCR Next Gen: NOT DETECTED

## 2021-09-02 MED ORDER — VANCOMYCIN HCL 1500 MG/300ML IV SOLN
1500.0000 mg | Freq: Three times a day (TID) | INTRAVENOUS | Status: DC
  Administered 2021-09-02 – 2021-09-04 (×5): 1500 mg via INTRAVENOUS
  Filled 2021-09-02 (×6): qty 300

## 2021-09-02 MED ORDER — DOCUSATE SODIUM 50 MG/5ML PO LIQD: 100.0000 mg | Freq: Two times a day (BID) | ORAL | Status: DC | PRN

## 2021-09-02 MED ORDER — STERILE WATER FOR INJECTION IJ SOLN
INTRAMUSCULAR | Status: AC
  Administered 2021-09-02: 10 mL
  Filled 2021-09-02: qty 10

## 2021-09-02 MED ORDER — VASOPRESSIN 20 UNITS/100 ML INFUSION FOR SHOCK
0.0000 [IU]/min | INTRAVENOUS | Status: DC
  Administered 2021-09-02 – 2021-09-03 (×3): 0.03 [IU]/min via INTRAVENOUS
  Administered 2021-09-04: 0.02 [IU]/min via INTRAVENOUS
  Filled 2021-09-02 (×4): qty 100

## 2021-09-02 MED ORDER — PROPOFOL 1000 MG/100ML IV EMUL
0.0000 ug/kg/min | INTRAVENOUS | Status: AC
  Administered 2021-09-02: 20 ug/kg/min via INTRAVENOUS
  Administered 2021-09-02: 30 ug/kg/min via INTRAVENOUS
  Administered 2021-09-02 (×2): 20 ug/kg/min via INTRAVENOUS
  Filled 2021-09-02 (×5): qty 100

## 2021-09-02 MED ORDER — HEPARIN BOLUS VIA INFUSION
5000.0000 [IU] | Freq: Once | INTRAVENOUS | Status: AC
  Administered 2021-09-02: 5000 [IU] via INTRAVENOUS
  Filled 2021-09-02: qty 5000

## 2021-09-02 MED ORDER — HEPARIN (PORCINE) 25000 UT/250ML-% IV SOLN
2900.0000 [IU]/h | INTRAVENOUS | Status: DC
  Administered 2021-09-02: 2100 [IU]/h via INTRAVENOUS
  Administered 2021-09-02: 2400 [IU]/h via INTRAVENOUS
  Administered 2021-09-03: 2900 [IU]/h via INTRAVENOUS
  Filled 2021-09-02 (×3): qty 250

## 2021-09-02 MED ORDER — MIDAZOLAM-SODIUM CHLORIDE 100-0.9 MG/100ML-% IV SOLN
0.5000 mg/h | INTRAVENOUS | Status: DC
  Administered 2021-09-02: 4 mg/h via INTRAVENOUS
  Administered 2021-09-03 – 2021-09-04 (×3): 10 mg/h via INTRAVENOUS
  Administered 2021-09-04 – 2021-09-05 (×2): 8 mg/h via INTRAVENOUS
  Administered 2021-09-05: 4 mg/h via INTRAVENOUS
  Filled 2021-09-02 (×7): qty 100

## 2021-09-02 MED ORDER — HEPARIN BOLUS VIA INFUSION
4000.0000 [IU] | Freq: Once | INTRAVENOUS | Status: AC
  Administered 2021-09-02: 4000 [IU] via INTRAVENOUS
  Filled 2021-09-02: qty 4000

## 2021-09-02 MED ORDER — POLYETHYLENE GLYCOL 3350 17 G PO PACK: 17.0000 g | PACK | Freq: Every day | ORAL | Status: DC | PRN

## 2021-09-02 MED ORDER — STERILE WATER FOR INJECTION IJ SOLN
50.0000 ng/kg/min | INTRAVENOUS | Status: DC
  Administered 2021-09-02: 40 ng/kg/min via RESPIRATORY_TRACT
  Administered 2021-09-03 – 2021-09-04 (×7): 50 ng/kg/min via RESPIRATORY_TRACT
  Administered 2021-09-04: 40 ng/kg/min via RESPIRATORY_TRACT
  Administered 2021-09-05 – 2021-09-08 (×17): 50 ng/kg/min via RESPIRATORY_TRACT
  Filled 2021-09-02 (×13): qty 5
  Filled 2021-09-02: qty 1.5
  Filled 2021-09-02 (×24): qty 5

## 2021-09-02 MED ORDER — MIDAZOLAM HCL 2 MG/2ML IJ SOLN
2.0000 mg | Freq: Once | INTRAMUSCULAR | Status: AC
Start: 2021-09-02 — End: 2021-09-02
  Administered 2021-09-02: 2 mg via INTRAVENOUS
  Filled 2021-09-02: qty 2

## 2021-09-02 MED ORDER — MILRINONE LACTATE IN DEXTROSE 20-5 MG/100ML-% IV SOLN
0.2500 ug/kg/min | INTRAVENOUS | Status: DC
  Administered 2021-09-02 – 2021-09-03 (×4): 0.25 ug/kg/min via INTRAVENOUS
  Filled 2021-09-02 (×4): qty 100

## 2021-09-02 MED ORDER — DOCUSATE SODIUM 50 MG/5ML PO LIQD
100.0000 mg | Freq: Two times a day (BID) | ORAL | Status: DC | PRN
Start: 2021-09-02 — End: 2021-09-02

## 2021-09-02 MED ORDER — SODIUM CHLORIDE 0.9 % IV SOLN
2.0000 g | Freq: Three times a day (TID) | INTRAVENOUS | Status: DC
  Administered 2021-09-02 – 2021-09-07 (×15): 2 g via INTRAVENOUS
  Filled 2021-09-02 (×16): qty 12.5

## 2021-09-02 MED ORDER — SENNOSIDES-DOCUSATE SODIUM 8.6-50 MG PO TABS
1.0000 | ORAL_TABLET | Freq: Two times a day (BID) | ORAL | Status: DC
  Administered 2021-09-02 – 2021-09-07 (×9): 1
  Filled 2021-09-02 (×9): qty 1

## 2021-09-02 MED ORDER — AMIODARONE IV BOLUS ONLY 150 MG/100ML
150.0000 mg | Freq: Once | INTRAVENOUS | Status: AC
Start: 2021-09-03 — End: 2021-09-03
  Administered 2021-09-02: 150 mg via INTRAVENOUS

## 2021-09-02 MED ORDER — VANCOMYCIN HCL 10 G IV SOLR
2500.0000 mg | Freq: Once | INTRAVENOUS | Status: AC
  Administered 2021-09-02: 2500 mg via INTRAVENOUS
  Filled 2021-09-02: qty 2500

## 2021-09-02 MED ORDER — INSULIN DETEMIR 100 UNIT/ML ~~LOC~~ SOLN
10.0000 [IU] | Freq: Two times a day (BID) | SUBCUTANEOUS | Status: DC
Start: 2021-09-02 — End: 2021-09-03
  Administered 2021-09-02 (×2): 10 [IU] via SUBCUTANEOUS
  Filled 2021-09-02 (×4): qty 0.1

## 2021-09-02 MED ORDER — ENOXAPARIN SODIUM 120 MG/0.8ML IJ SOSY: 0.5000 mg/kg | PREFILLED_SYRINGE | INTRAMUSCULAR | Status: DC

## 2021-09-02 NOTE — Progress Notes (Addendum)
[facility] Progress Note Patient Name: Joe Carter DOB: 1980-05-30 MRN: [id number]   Date of Service  09/02/2021  HPI/Events of Note  Notified that patient desaturated. Seen on camera. Has been on 100% fio2, peep 15, RR 22, VT 620. When RN started her shift, he had O2 sat of 88. He has been on Levophed at 35 mic/min, lasix drip, milrinone (started today), heparin drip as well as propofol and fentanyl. RN notes he was cleaned after a bowel movement and desaturated and has not recovered. Already on peep 15. RN also notes he was fully awake and uncomfortable on vent while being on propofol and fentanyl. Seen on camera. Wakes up. Hypoxic. HR 130s. RN also notes he had a run of SVT . Has made around 1200 cc urine in 4 hours and persistently febrile.   eICU Interventions  Stat CXR. ABG Increase sedation Versed x 1 If we need to, will start a versed drip Epopros and precedex were stopped today - reportedly did nt have much change with Epo  BMP and mag also to be done  Is on vanc and cefepime already     Intervention Category Major Interventions: Respiratory failure - evaluation and management  Margaretmary Lombard 09/02/2021, 10:36 PM  Addendum :  ABG noted Patient also more hypotensive Cutting down on sedation and dropping lasix drip by half for now and will turn it off if remains hypotensive CXR pending Have also called and spoken with Dr Ruthann Cancer from [facility] who will evaluate the patient  CVP reading in 78s , RN notes this has not been reading accurately  HR in sinus tach 130s, not new

## 2021-09-02 NOTE — Progress Notes (Signed)
[facility] Progress Note Patient Name: Joe Carter DOB: 26-Feb-1981 MRN: [id number]   Date of Service  09/02/2021  HPI/Events of Note  ABG reviewed.  eICU Interventions  Patient with compensated respiratory acidosis.        Kerry Kass Tena Linebaugh 09/02/2021, 5:27 AM

## 2021-09-02 NOTE — Progress Notes (Addendum)
NAME:  CLYDELL SPOSITO, MRN:  [id number], DOB:  09-18-1980, LOS: 4 ADMISSION DATE:  08/24/2021, CONSULTATION DATE:  09/15/2021 REFERRING MD:  Caryl Ada PA-C/ ED, CHIEF COMPLAINT:  acute respiratory failure   History of Present Illness:  Mr. Musich is a 41 year old gentleman with a history of OHS, OSA, chronic respiratory failure on 3 L supplemental oxygen who presented to internal medicine clinic with dyspnea on exertion, orthopnea, worsening edema.  He had been restarted on his Lasix in May 2023 due to severe lower extremity edema.  He had not been taking this due to frequent urination that prohibit him from completing tasks at work.  In the internal medicine clinic he was saturating in the low 80s, not responsive to his home oxygen being resumed.  He was transferred to the ED.  Upon arrival he was increased supplemental oxygen, between nonrebreather for his saturations were in the 90s.  ABG with significant hypercapnia, so he was started on BiPAP.  Earlier he ripped off BiPAP and removed his IVs, attempting to leave.  When he pulled off his oxygen he desatted to the 80s again and started to feel poorly.  Currently he has BiPAP back on.  Due to chest x-ray findings concerning for multifocal pneumonia he was given ceftriaxone and azithromycin.  He was given a dose of Lasix in the ED.  PCCM was consulted for evaluation of hypoxic and hypercapnic respiratory failure.  In December 2022 he was admitted for similar presentation requiring intubation and mechanical ventilation.  Pertinent  Medical History  OSA, OHS Chronic respiratory failure HFpEF  Significant Hospital Events: Including procedures, antibiotic start and stop dates in addition to other pertinent events   6/7 admission to the ICU 6/8 intubated.  6/9 still hypoxic - started on epoprostenol, fevers 6/10 fevers O2 still marginal  Interim History / Subjective:  Still febrile consistently despite tylenol- 103.2 Increased NE needs No  significant change/ improvements on vent   Objective   Blood pressure (!) 130/54, pulse 82, temperature (!) 102.7 F (39.3 C), resp. rate (!) 22, height _0  (1.956 m), weight (!) 212.7 kg, SpO2 90 %. CVP:  [71 mmHg-89 mmHg] 89 mmHg  Vent Mode: PRVC FiO2 (%):  [10 %-100 %] 10 % Set Rate:  [22 bmp] 22 bmp Vt Set:  [620 mL-640 mL] 640 mL PEEP:  [15 cmH20] 15 cmH20 Plateau Pressure:  [21 cmH20-35 cmH20] 35 cmH20   Intake/Output Summary (Last 24 hours) at 09/02/2021 0832 Last data filed at 09/02/2021 0630 Gross per 24 hour  Intake 3330.71 ml  Output 4220 ml  Net -889.29 ml   Filed Weights   08/31/21 0434 09/01/21 0500 09/02/21 0400  Weight: (!) 212.7 kg (!) 205 kg (!) 212.7 kg   Sedated on precedex 0.7, fentanyl 125 General:  critically ill morbidly obese male intubated and sedated on MV HEENT: MM pink/moist, ETT 8/ OGT, pupils 3/reactive, anicteric, some intermittent facial twitching noted  Neuro: slowly to open eyes to verbal, follows some simple intermittent commands CV: rr, NSR, distant PULM:  MV supported breaths, coarse, scattered inspiratory wheeze and rhonchi, minimal whitish secretions, higher PIP upper 30's/ plat 31 GI: protuberant, +bs, foley, no recent BM Extremities: warm/dry, +1LE  edema  Skin: no rashes   Remains on lasix gtt 24m/hr, amio gtt, NE 13, epo  CVP- reading in the 80's, high pressure likely related to RV overload  Labs> sCr stable 1.31, WBC unchanged 15.9> 14.9,  ABG 7.392/ 65.9/ 66/ 39 ( 1.0 and 15  peep) No CXR   Assessment & Plan:   Acute on chronic hypoxic and hypercapnic respiratory failure; has chronic OSA and OHS with noncompliance with BiPAP w/ volume overload and possible CAP Failed Bipap trial and required intubation 6/9 - no improvement on epoprostenol.  Will wean/ stop today  - ?degree of hypoxia related to shunting. - had negative LE dopplers 6/8 but given degree of hypoxia and RV failure, switch from vte lovenox to heparin gtt,  empirically  - consider proning.  PF ratio remains 65 - MV support, 4-8cc/kg IBW with goal Pplat <30 and DP<15  - ween PEEP/ FiO2 for sats > 88% - some degree of hypercarbia tolerated  - VAP prevention protocol/ PPI - PAD protocol for sedation> changing precedex to propofol, cont fentanyl gtt with scheduled bowel regimen - follow trach asp but broadening abx as below - resume BD (not able to give with epo) - off load the RV as able> lasix as below - consider TEE to r/o PFO - CXR / ABG daily    Shock- mixed septic +/- RV failure  - refractory fever despite regular tylenol - possibly related to precedex (4-5% chance of pyrexia; fevers started day of precedex), will change to propofol.  Will also send UA, and BC x 2 (these were never sent), and follow trach asp>  - broaden abx> vanc/ cefepime, check MRSA PCR  - unchanged WBC, trend - check/ trend PCT - follow cultures  - continue NE for MAP goal > 65, may need vaso - check coox.  Start milrinone for RV support.  Continue lasix gtt, hold acetazolamide today  - consider esophageal manometry to estimate actual transpulmonary pressure.    Acute metabolic encephalopathy due to hypercapnia/ metabolic  - respiratory and hemodynamic support as above - infectious workup as above - RASS goal -2 / vent synchrony    Acute on chronic HFpEF, RV failure - cont to try and off load RV.  Unable to increase lasix given increased pressor needs.   - coox and milrinone as above - check LFTs in am  - consider RHC when optimized, prior to d/c   Prediabetes, hyperglycemia Morbid obesity - continue SSI, adding levemir given CBGs> 170s consistently  - cont hypocaloric TF     Best Practice (right click and "Reselect all SmartList Selections" daily)   Diet/type: tubefeeds DVT prophylaxis: systemic heparin GI prophylaxis: PPI Lines: Central line Foley:  Yes, and it is still needed Code Status:  full code Last date of multidisciplinary goals of care  discussion [ pending, left VM for mother on 6/9}  Pending 6/11  Labs   CBC: Recent Labs  Lab 09/12/2021 1611 09/03/2021 1824 08/30/21 0002 08/30/21 0309 08/31/21 0146 08/31/21 0834 08/31/21 1929 09/01/21 0419 09/01/21 0505 09/02/21 0425 09/02/21 0431  WBC 10.6*  --  11.0*  --  13.9*  --   --   --  15.9* 14.9*  --   NEUTROABS 7.7  --   --   --   --   --   --   --   --   --   --   HGB 16.3   < > 16.4   < > 16.4   < > 18.4* 18.7* 16.0 16.0 18.0*  HCT 55.1*   < > 56.0*   < > 57.5*   < > 54.0* 55.0* 52.8* 51.8 53.0*  MCV 101.8*  --  102.0*  --  104.2*  --   --   --  96.0  96.8  --   PLT 233  --  287  --  233  --   --   --  239 213  --    < > = values in this interval not displayed.    Basic Metabolic Panel: Recent Labs  Lab 08/30/21 0107 08/30/21 0309 08/30/21 1543 08/31/21 0146 08/31/21 0834 08/31/21 1333 08/31/21 1437 08/31/21 1929 09/01/21 0034 09/01/21 0344 09/01/21 0419 09/01/21 1700 09/02/21 0425 09/02/21 0431  NA 141   < > 143 143   < >  --    < > 141  --  141 141  --  141 143  K 4.8   < > 5.0 4.4   < >  --    < > 4.0  --  3.8 4.1  --  4.0 4.0  CL 92*  --  91* 89*  --   --   --   --   --  92*  --   --  95*  --   CO2 39*  --  >45* 41*  --   --   --   --   --  38*  --   --  34*  --   GLUCOSE 108*  --  124* 106*  --   --   --   --   --  190*  --   --  195*  --   BUN 7  --  6 9  --   --   --   --   --  29*  --   --  35*  --   CREATININE 1.05  --  1.05 0.90  --   --   --   --   --  1.33*  --   --  1.31*  --   CALCIUM 8.8*  --  8.9 8.9  --   --   --   --   --  8.7*  --   --  8.4*  --   MG 2.1  --   --  1.7  --  2.2  --   --  1.9 2.1  --  2.2  --   --   PHOS 5.5*  --   --  6.8*  --  2.3*  --   --  2.5 2.0*  --  4.4  --   --    < > = values in this interval not displayed.   GFR: Estimated Creatinine Clearance: 143.9 mL/min (A) (by C-G formula based on SCr of 1.31 mg/dL (H)). Recent Labs  Lab 08/30/21 0002 08/30/21 0107 08/31/21 0146 08/31/21 1330 09/01/21 0344  09/01/21 0505 09/02/21 0425  PROCALCITON  --  <0.10 0.13  --  11.07  --   --   WBC 11.0*  --  13.9*  --   --  15.9* 14.9*  LATICACIDVEN  --   --   --  1.9  --   --   --     Liver Function Tests: Recent Labs  Lab 09/01/21 0344  AST 16  ALT 12  ALKPHOS 55  BILITOT 3.2*  PROT 7.4  ALBUMIN 2.8*   No results for input(s): "LIPASE", "AMYLASE" in the last 168 hours. No results for input(s): "AMMONIA" in the last 168 hours.  ABG    Component Value Date/Time   PHART 7.392 09/02/2021 0431   PCO2ART 65.9 (HH) 09/02/2021 0431   PO2ART 66 (L) 09/02/2021 0431   HCO3 39.2 (H) 09/02/2021 0431  TCO2 41 (H) 09/02/2021 0431   O2SAT 89 09/02/2021 0431     Coagulation Profile: No results for input(s): "INR", "PROTIME" in the last 168 hours.  Cardiac Enzymes: No results for input(s): "CKTOTAL", "CKMB", "CKMBINDEX", "TROPONINI" in the last 168 hours.  HbA1C: Hemoglobin A1C  Date/Time Value Ref Range Status  07/31/2021 02:21 PM 5.9 (A) 4.0 - 5.6 % Final  11/29/2020 04:24 PM 6.6 (A) 4.0 - 5.6 % Final   Hgb A1c MFr Bld  Date/Time Value Ref Range Status  03/04/2021 05:09 PM 6.8 (H) 4.8 - 5.6 % Final    Comment:    (NOTE) Pre diabetes:          5.7%-6.4%  Diabetes:              >6.4%  Glycemic control for   <7.0% adults with diabetes     CBG: Recent Labs  Lab 09/01/21 1601 09/01/21 2008 09/02/21 0047 09/02/21 0426 09/02/21 0748  GLUCAP 175* 204* 180* 199* 176*     Critical care time:  36 min      Kennieth Rad, ACNP [facility] 09/02/2021, 8:33 AM  See Amion for pager If no response to pager, please call PCCM consult pager After 7:00 pm call [place]
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
## 2021-09-02 NOTE — Progress Notes (Signed)
NAME:  Joe Carter, MRN:  [id number], DOB:  [birth year], LOS: 4 ADMISSION DATE:  09/14/2021, CONSULTATION DATE:  09/16/2021 REFERRING MD:  Caryl Ada PA-C/ ED, CHIEF COMPLAINT:  acute respiratory failure   History of Present Illness:  Joe Carter is a 41 year old gentleman with a history of OHS, OSA, chronic respiratory failure on 3 L supplemental oxygen who presented to internal medicine clinic with dyspnea on exertion, orthopnea, worsening edema.  He had been restarted on his Lasix in May 2023 due to severe lower extremity edema.  He had not been taking this due to frequent urination that prohibit him from completing tasks at work.  In the internal medicine clinic he was saturating in the low 80s, not responsive to his home oxygen being resumed.  He was transferred to the ED.  Upon arrival he was increased supplemental oxygen, between nonrebreather for his saturations were in the 90s.  ABG with significant hypercapnia, so he was started on BiPAP.  Earlier he ripped off BiPAP and removed his IVs, attempting to leave.  When he pulled off his oxygen he desatted to the 80s again and started to feel poorly.  Currently he has BiPAP back on.  Due to chest x-ray findings concerning for multifocal pneumonia he was given ceftriaxone and azithromycin.  He was given a dose of Lasix in the ED.  PCCM was consulted for evaluation of hypoxic and hypercapnic respiratory failure.  In December 2022 he was admitted for similar presentation requiring intubation and mechanical ventilation.  Pertinent  Medical History  OSA, OHS Chronic respiratory failure HFpEF  Significant Hospital Events: Including procedures, antibiotic start and stop dates in addition to other pertinent events   6/7 admission to the ICU 6/8 intubated.  6/9 still hypoxic - started on epoprostenol 6/10 fevers O2 still marginal  Interim History / Subjective:  Comfortable on current sedation level. Still marginal oxygenation.   Objective   Blood  pressure (!) 130/54, pulse 82, temperature (!) 102.7 F (39.3 C), resp. rate (!) 22, height 6' 5" (1.956 m), weight (!) 212.7 kg, SpO2 90 %. CVP:  [71 mmHg-89 mmHg] 89 mmHg  Vent Mode: PRVC FiO2 (%):  [10 %-100 %] 100 % Set Rate:  [22 bmp] 22 bmp Vt Set:  [620 mL-640 mL] 640 mL PEEP:  [15 cmH20] 15 cmH20 Plateau Pressure:  [21 cmH20-35 cmH20] 35 cmH20   Intake/Output Summary (Last 24 hours) at 09/02/2021 1141 Last data filed at 09/02/2021 0912 Gross per 24 hour  Intake 2812.45 ml  Output 3845 ml  Net -1032.55 ml    Filed Weights   08/31/21 0434 09/01/21 0500 09/02/21 0400  Weight: (!) 212.7 kg (!) 205 kg (!) 212.7 kg     General:  obese, critically ill-appearing M, intubated and sedated HEENT: MM pink/moist, sclera anicteric pupils equal Neuro: examined on sedation, spontaneous movement, but not awake or following commands CV: s1s2 rrr, no m/r/g PULM:  mechanical vent sounds bilaterally, requiring 100%, synchronous, equal chest rise GI: soft, non-distended Extremities: warm/dry, 1+ edema  Skin: no rashes or lesions   Assessment & Plan:   Stress hyperglycemia Currently well controlled on sliding scale insulin.   Acute heart failure with preserved ejection fraction (HFpEF) ([place]) Echo from 02/2021 shows normal EF.  Now POC shows moderate RV dysfunction.  - 5 L fluid balance  - Continue diuresis, continue furosemide infusion.  - RV function should improve with mechanical ventilation and diuresis. Consider repeat limited echo prior to discharge.  - Follow CVP and  decrease furosemide infusion as edema improved.   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 greater (HCC) Morbid obesity driving current respiratory decompensation.   - Hypocaloric feeds, maintain protein intake. - Would benefit from esophageal manometry to estimate actual transpulmonary pressure.   Acute metabolic encephalopathy Currently comfortable on fentanyl and  dexmedetomidine   - Titrate sedation to RASS of -2.  Acute on chronic respiratory failure with hypercapnia (HCC) Oxygenation remains marginal despite epoprostenol.  Morbid obesity make recorded airway pressures inaccurate reflection of transpulmonary pressure.   - No improvement in oxygenation with further increase in PEEP beyond 15cmH2O - Wean FiO2 as tolerated.  - Have cleared pCO2 adequately - Trend ABG.  - Stop epoprostenol.    Community acquired pneumonia Mild leukocytosis, patient is in distress without significant edema.  Still febrile WBC and GPC on sputum CXR - bibasilar infiltrates  Patient is now febrile and leukocytosis to 15.9 (increasing)  - re-cultured and coverage broadened to cefepime and vancomycin  Pulmonary hypertension with acute cor pulmonale (HCC) Suspect patient has PAH due to untreated OSA (WHO group III) with acute decompensation from HFpEF and pneumonia.   - Adding milrinone for RV support - Follow ScvO2   Best Practice (right click and "Reselect all SmartList Selections" daily)   Diet/type: tubefeeds DVT prophylaxis: LMWH GI prophylaxis: PPI Lines: Central line Foley:  Yes, and it is still needed Code Status:  full code Last date of multidisciplinary goals of care discussion [ pending, left VM for mother on 6/9}   CRITICAL CARE Performed by: Kipp Brood   Total critical care time: 40 minutes  Critical care time was exclusive of separately billable procedures and treating other patients.  Critical care was necessary to treat or prevent imminent or life-threatening deterioration.  Critical care was time spent personally by me on the following activities: development of treatment plan with patient and/or surrogate as well as nursing, discussions with consultants, evaluation of patient's response to treatment, examination of patient, obtaining history from patient or surrogate, ordering and performing treatments and interventions, ordering  and review of laboratory studies, ordering and review of radiographic studies, pulse oximetry and re-evaluation of patient's condition.  Kipp Brood, MD [facility] ICU Physician [place]  Pager: [phone] Or Epic Secure Chat After hours: [phone].  09/02/2021, 11:42 AM     After 7:00 pm call Elink  [phone]?[place]

## 2021-09-02 NOTE — Progress Notes (Signed)
Cross covering ICU physician.   Called to bedside by [facility] for eval. Pt's oxygenation has been declining and now consistently in 70's via pulse ox.   Pt admitted for acute hypoxic resp failure in setting of RH failure. He has been diuresing well with >256m/hr output on lasix infusion. He was started on flolan and this was weaned off today however upon abg review his oxygen has been dropping since wean. We will resume flolan at this time.   He is also tachy in the 130's with runs of svt. I see amio gtt ordered but this was reportedly never started. I will bolus again with 1560mand start infusion at 60.   We will change his sedation from propofol to versed infusion and increase fentanyl in hopes of helping decrease the norepi and potential tachyarrhythmias he cont to have. In addition to help with this goal we will start vasopressin.   Perhaps reaching out to advanced HF team in am would be beneficial to considering age however his body habitus does make many potential therapies to support RH failure quite challenging if not eliminated. Repeat echo in am as well in light of the continued decline.   RN and RT updated at bedside.

## 2021-09-02 NOTE — Assessment & Plan Note (Signed)
Suspect patient has PAH due to untreated OSA (WHO group III) with acute decompensation from HFpEF and pneumonia.   - Adding milrinone for RV support - Follow ScvO2

## 2021-09-02 NOTE — Progress Notes (Signed)
Pharmacy Antibiotic Note  Joe Carter is a 41 y.o. male admitted on 09/07/2021 with suspected sepsis.  Pharmacy has been consulted for Cefepime and Vancomycin dosing.  WBC 14.9, down Persistently fevering on CTX - Tmax 103.2 > 102.87F despite APAP dosing AKI - baseline Scr 1.0  Plan: Cefepime 2 g IV q8h Give vancomycin 2500 mg IV x1 Vancomycin 1500 mg IV every 8 hours.  Goal trough 15-20 mcg/mL.    > eAUC 520.9, SCr used 1.31, Cmax 29.5    > consistent with nomogram dosing recommendations  Monitor renal function, CBC, cultures/sensitivities, LOT and de-escalate as able Obtain vancomycin levels as indicated  Temp (24hrs), Avg:101.7 F (38.7 C), Min:93.7 F (34.3 C), Max:103.1 F (39.5 C)  Recent Labs  Lab 08/28/2021 1611 08/30/21 0002 08/30/21 0107 08/30/21 1543 08/31/21 0146 08/31/21 1330 09/01/21 0344 09/01/21 0505 09/02/21 0425  WBC 10.6* 11.0*  --   --  13.9*  --   --  15.9* 14.9*  CREATININE 0.85  --  1.05 1.05 0.90  --  1.33*  --  1.31*  LATICACIDVEN  --   --   --   --   --  1.9  --   --   --     Estimated Creatinine Clearance: 143.9 mL/min (A) (by C-G formula based on SCr of 1.31 mg/dL (H)).    No Known Allergies  Antimicrobials this admission: Azithro 6/7 > 6/8 CTX 6/7 > 6/8, 6/10 x1  Cefepime 6/11 >  Vancomycin 6/11 >   Dose adjustments this admission: none  Microbiology results: 6/09 TA: few WBC, few GPC in pairs/chains 6/10 TA: rare GNR, rare GPC pairs 6/11 MRSA: sent 6/11 BCx: sent  Thank you for allowing pharmacy to be a part of this patient's care.  Laurey Arrow, PharmD PGY1 Pharmacy Resident 09/02/2021  8:40 AM  Please check [email or web address] for unit-specific pharmacy phone numbers.
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
## 2021-09-03 ENCOUNTER — Inpatient Hospital Stay (HOSPITAL_COMMUNITY): Payer: 59

## 2021-09-03 DIAGNOSIS — I509 Heart failure, unspecified: Secondary | ICD-10-CM | POA: Diagnosis not present

## 2021-09-03 DIAGNOSIS — I5031 Acute diastolic (congestive) heart failure: Secondary | ICD-10-CM | POA: Diagnosis not present

## 2021-09-03 DIAGNOSIS — J9622 Acute and chronic respiratory failure with hypercapnia: Secondary | ICD-10-CM | POA: Diagnosis not present

## 2021-09-03 LAB — POCT I-STAT EG7
Acid-Base Excess: 5 mmol/L — ABNORMAL HIGH (ref 0.0–2.0)
Bicarbonate: 34.4 mmol/L — ABNORMAL HIGH (ref 20.0–28.0)
Calcium, Ion: 1.03 mmol/L — ABNORMAL LOW (ref 1.15–1.40)
HCT: 49 % (ref 39.0–52.0)
Hemoglobin: 16.7 g/dL (ref 13.0–17.0)
O2 Saturation: 79 %
Patient temperature: 38.1
Potassium: 4.2 mmol/L (ref 3.5–5.1)
Sodium: 145 mmol/L (ref 135–145)
TCO2: 37 mmol/L — ABNORMAL HIGH (ref 22–32)
pCO2, Ven: 71.9 mmHg (ref 44–60)
pH, Ven: 7.294 (ref 7.25–7.43)
pO2, Ven: 54 mmHg — ABNORMAL HIGH (ref 32–45)

## 2021-09-03 LAB — HEPATIC FUNCTION PANEL
ALT: 13 U/L (ref 0–44)
AST: 34 U/L (ref 15–41)
Albumin: 2.3 g/dL — ABNORMAL LOW (ref 3.5–5.0)
Alkaline Phosphatase: 50 U/L (ref 38–126)
Bilirubin, Direct: 0.7 mg/dL — ABNORMAL HIGH (ref 0.0–0.2)
Indirect Bilirubin: 1 mg/dL — ABNORMAL HIGH (ref 0.3–0.9)
Total Bilirubin: 1.7 mg/dL — ABNORMAL HIGH (ref 0.3–1.2)
Total Protein: 7.6 g/dL (ref 6.5–8.1)

## 2021-09-03 LAB — HEPARIN LEVEL (UNFRACTIONATED)
Heparin Unfractionated: 0.17 IU/mL — ABNORMAL LOW (ref 0.30–0.70)
Heparin Unfractionated: 0.19 IU/mL — ABNORMAL LOW (ref 0.30–0.70)

## 2021-09-03 LAB — POCT I-STAT 7, (LYTES, BLD GAS, ICA,H+H)
Acid-Base Excess: 6 mmol/L — ABNORMAL HIGH (ref 0.0–2.0)
Acid-Base Excess: 4 mmol/L — ABNORMAL HIGH (ref 0.0–2.0)
Bicarbonate: 35.4 mmol/L — ABNORMAL HIGH (ref 20.0–28.0)
Bicarbonate: 32.9 mmol/L — ABNORMAL HIGH (ref 20.0–28.0)
Calcium, Ion: 1.09 mmol/L — ABNORMAL LOW (ref 1.15–1.40)
Calcium, Ion: 1.04 mmol/L — ABNORMAL LOW (ref 1.15–1.40)
HCT: 51 % (ref 39.0–52.0)
HCT: 51 % (ref 39.0–52.0)
Hemoglobin: 17.3 g/dL — ABNORMAL HIGH (ref 13.0–17.0)
Hemoglobin: 17.3 g/dL — ABNORMAL HIGH (ref 13.0–17.0)
O2 Saturation: 78 %
O2 Saturation: 81 %
Patient temperature: 37.9
Patient temperature: 38.1
Potassium: 4 mmol/L (ref 3.5–5.1)
Potassium: 4.4 mmol/L (ref 3.5–5.1)
Sodium: 145 mmol/L (ref 135–145)
Sodium: 146 mmol/L — ABNORMAL HIGH (ref 135–145)
TCO2: 37 mmol/L — ABNORMAL HIGH (ref 22–32)
TCO2: 35 mmol/L — ABNORMAL HIGH (ref 22–32)
pCO2 arterial: 68.9 mmHg (ref 32–48)
pCO2 arterial: 69.5 mmHg (ref 32–48)
pH, Arterial: 7.323 — ABNORMAL LOW (ref 7.35–7.45)
pH, Arterial: 7.289 — ABNORMAL LOW (ref 7.35–7.45)
pO2, Arterial: 50 mmHg — ABNORMAL LOW (ref 83–108)
pO2, Arterial: 56 mmHg — ABNORMAL LOW (ref 83–108)

## 2021-09-03 LAB — RESPIRATORY PANEL BY PCR

## 2021-09-03 LAB — ECHOCARDIOGRAM LIMITED
AR max vel: 2.17 cm2
AV Area VTI: 2.49 cm2
AV Area mean vel: 2.42 cm2
AV Mean grad: 17 mmHg
AV Peak grad: 32.5 mmHg
Ao pk vel: 2.85 m/s
Height: 77 in
Weight: 7456 oz

## 2021-09-03 LAB — CBC
HCT: 49.8 % (ref 39.0–52.0)
Hemoglobin: 14.6 g/dL (ref 13.0–17.0)
MCH: 29.6 pg (ref 26.0–34.0)
MCHC: 29.3 g/dL — ABNORMAL LOW (ref 30.0–36.0)
MCV: 100.8 fL — ABNORMAL HIGH (ref 80.0–100.0)
Platelets: 181 10*3/uL (ref 150–400)
RBC: 4.94 MIL/uL (ref 4.22–5.81)
RDW: 15.9 % — ABNORMAL HIGH (ref 11.5–15.5)
WBC: 20.2 10*3/uL — ABNORMAL HIGH (ref 4.0–10.5)
nRBC: 0 % (ref 0.0–0.2)

## 2021-09-03 LAB — COOXEMETRY PANEL
Carboxyhemoglobin: 1.9 % — ABNORMAL HIGH (ref 0.5–1.5)
Carboxyhemoglobin: 2.1 % — ABNORMAL HIGH (ref 0.5–1.5)
Methemoglobin: <0.7 % (ref 0.0–1.5)
Methemoglobin: 0.7 % (ref 0.0–1.5)
O2 Saturation: 91.3 %
O2 Saturation: 79.8 %
Total hemoglobin: 13.9 g/dL (ref 12.0–16.0)
Total hemoglobin: 13.7 g/dL (ref 12.0–16.0)

## 2021-09-03 LAB — CULTURE, RESPIRATORY W GRAM STAIN
Culture: NORMAL
Culture: NORMAL

## 2021-09-03 LAB — BASIC METABOLIC PANEL
Anion gap: 11 (ref 5–15)
BUN: 38 mg/dL — ABNORMAL HIGH (ref 6–20)
CO2: 33 mmol/L — ABNORMAL HIGH (ref 22–32)
Calcium: 8.2 mg/dL — ABNORMAL LOW (ref 8.9–10.3)
Chloride: 100 mmol/L (ref 98–111)
Creatinine, Ser: 1.6 mg/dL — ABNORMAL HIGH (ref 0.61–1.24)
GFR, Estimated: 55 mL/min — ABNORMAL LOW (ref >60)
Glucose, Bld: 226 mg/dL — ABNORMAL HIGH (ref 70–99)
Potassium: 3.9 mmol/L (ref 3.5–5.1)
Sodium: 144 mmol/L (ref 135–145)

## 2021-09-03 LAB — GLUCOSE, CAPILLARY
Glucose-Capillary: 176 mg/dL — ABNORMAL HIGH (ref 70–99)
Glucose-Capillary: 195 mg/dL — ABNORMAL HIGH (ref 70–99)
Glucose-Capillary: 212 mg/dL — ABNORMAL HIGH (ref 70–99)
Glucose-Capillary: 229 mg/dL — ABNORMAL HIGH (ref 70–99)
Glucose-Capillary: 269 mg/dL — ABNORMAL HIGH (ref 70–99)
Glucose-Capillary: 283 mg/dL — ABNORMAL HIGH (ref 70–99)
Glucose-Capillary: 297 mg/dL — ABNORMAL HIGH (ref 70–99)

## 2021-09-03 LAB — PROCALCITONIN: Procalcitonin: 12.66 ng/mL

## 2021-09-03 MED ORDER — INSULIN DETEMIR 100 UNIT/ML ~~LOC~~ SOLN
18.0000 [IU] | Freq: Two times a day (BID) | SUBCUTANEOUS | Status: DC
Start: 2021-09-03 — End: 2021-09-04
  Administered 2021-09-03 (×2): 18 [IU] via SUBCUTANEOUS
  Filled 2021-09-03 (×4): qty 0.18

## 2021-09-03 MED ORDER — HEPARIN BOLUS VIA INFUSION
4000.0000 [IU] | Freq: Once | INTRAVENOUS | Status: AC
  Administered 2021-09-03: 4000 [IU] via INTRAVENOUS
  Filled 2021-09-03: qty 4000

## 2021-09-03 MED ORDER — POTASSIUM CHLORIDE 10 MEQ/50ML IV SOLN
10.0000 meq | INTRAVENOUS | Status: AC
  Administered 2021-09-03 (×4): 10 meq via INTRAVENOUS
  Filled 2021-09-03 (×4): qty 50

## 2021-09-03 MED ORDER — METHYLPREDNISOLONE SODIUM SUCC 125 MG IJ SOLR
125.0000 mg | Freq: Two times a day (BID) | INTRAMUSCULAR | Status: DC
  Administered 2021-09-03 – 2021-09-05 (×6): 125 mg via INTRAVENOUS
  Filled 2021-09-03 (×6): qty 2

## 2021-09-03 MED ORDER — STERILE WATER FOR INJECTION IJ SOLN
INTRAMUSCULAR | Status: AC
  Filled 2021-09-03: qty 10

## 2021-09-03 MED ORDER — ENOXAPARIN SODIUM 100 MG/ML IJ SOSY
100.0000 mg | PREFILLED_SYRINGE | INTRAMUSCULAR | Status: DC
  Administered 2021-09-03 – 2021-09-07 (×5): 100 mg via SUBCUTANEOUS
  Filled 2021-09-03 (×5): qty 1

## 2021-09-03 MED ORDER — ALBUMIN HUMAN 25 % IV SOLN
25.0000 g | Freq: Four times a day (QID) | INTRAVENOUS | Status: AC
  Administered 2021-09-03 – 2021-09-04 (×4): 25 g via INTRAVENOUS
  Filled 2021-09-03 (×4): qty 100

## 2021-09-03 NOTE — Progress Notes (Signed)
ANTICOAGULATION CONSULT NOTE - Follow Up Consult  Pharmacy Consult for heparin Indication:  r/o VTE  Labs: Recent Labs    09/01/21 0344 09/01/21 0419 09/01/21 0505 09/02/21 0425 09/02/21 0431 09/02/21 1148 09/02/21 1800 09/02/21 2243 09/02/21 2246 09/03/21 0232  HGB  --    < > 16.0 16.0 18.0*  --   --   --  17.3*  --   HCT  --    < > 52.8* 51.8 53.0*  --   --   --  51.0  --   PLT  --   --  239 213  --   --   --   --   --   --   APTT  --   --   --   --   --  34  --   --   --   --   LABPROT  --   --   --   --   --  16.3*  --   --   --   --   INR  --   --   --   --   --  1.3*  --   --   --   --   HEPARINUNFRC  --   --   --   --   --  <0.10* 0.13*  --   --  0.17*  CREATININE 1.33*  --   --  1.31*  --   --   --  1.49*  --   --    < > = values in this interval not displayed.    Assessment: 41yo male subtherapeutic on heparin after rate change; no infusion issues or signs of bleeding per RN.  Goal of Therapy:  Heparin level 0.3-0.7 units/ml   Plan:  Will rebolus with heparin 4000 units and increase heparin infusion by 3 units/kgABW/hr to 2900 units/hr and check level in 6 hours.    Wynona Neat, PharmD, BCPS  09/03/2021,3:37 AM

## 2021-09-03 NOTE — Progress Notes (Signed)
RT note-RR and PEEP increased per Dr. Tamala Julian

## 2021-09-03 NOTE — Progress Notes (Signed)
Mother updated on patient's status by phone.  Told her we are reaching the limits of what we can offer and will call her in if he deteriorates despite this.  Erskine Emery MD PCCM

## 2021-09-03 NOTE — Procedures (Signed)
Arterial Catheter Insertion Procedure Note  Joe Carter  [id number]  14-Mar-1981  Date:09/03/21  Time:9:59 PM    Provider Performing: Dimple Nanas    Procedure: Insertion of Arterial Line [phone]) without US guidance  Indication(s) Blood pressure monitoring and/or need for frequent ABGs  Consent Unable to obtain consent due to emergent nature of procedure.  Anesthesia None   Time Out Verified patient identification, verified procedure, site/side was marked, verified correct patient position, special equipment/implants available, medications/allergies/relevant history reviewed, required imaging and test results available.   Sterile Technique Maximal sterile technique including full sterile barrier drape, hand hygiene, sterile gown, sterile gloves, mask, hair covering, sterile ultrasound probe cover (if used).   Procedure Description Area of catheter insertion was cleaned with chlorhexidine and draped in sterile fashion. With real-time ultrasound guidance an arterial catheter was placed into the left radial artery.  Appropriate arterial tracings confirmed on monitor.     Complications/Tolerance None; patient tolerated the procedure well.   EBL Minimal   Specimen(s) None

## 2021-09-03 NOTE — Progress Notes (Signed)
[place] Progress Note Patient Name: Joe Carter DOB: February 20, 1981 MRN: [id number]   Date of Service  09/03/2021  HPI/Events of Note  Patient lost A-line with dressing change.   eICU Interventions  Plan: RT to replace A-line.     Intervention Category Major Interventions: Other:  Lysle Dingwall 09/03/2021, 8:52 PM

## 2021-09-03 NOTE — Progress Notes (Signed)
RT note-Epo placed new syringe

## 2021-09-03 NOTE — Progress Notes (Signed)
NAME:  Joe Carter, MRN:  [id number], DOB:  [birth year], LOS: 5 ADMISSION DATE:  09/09/2021, CONSULTATION DATE:  08/28/2021 REFERRING MD:  Caryl Ada PA-C/ ED, CHIEF COMPLAINT:  acute respiratory failure   History of Present Illness:  Joe Carter is a 41 year old gentleman with a history of OHS, OSA, chronic respiratory failure on 3 L supplemental oxygen who presented to internal medicine clinic with dyspnea on exertion, orthopnea, worsening edema.  He had been restarted on his Lasix in May 2023 due to severe lower extremity edema.  He had not been taking this due to frequent urination that prohibit him from completing tasks at work.  In the internal medicine clinic he was saturating in the low 80s, not responsive to his home oxygen being resumed.  He was transferred to the ED.  Upon arrival he was increased supplemental oxygen, between nonrebreather for his saturations were in the 90s.  ABG with significant hypercapnia, so he was started on BiPAP.  Earlier he ripped off BiPAP and removed his IVs, attempting to leave.  When he pulled off his oxygen he desatted to the 80s again and started to feel poorly.  Currently he has BiPAP back on.  Due to chest x-ray findings concerning for multifocal pneumonia he was given ceftriaxone and azithromycin.  He was given a dose of Lasix in the ED.  PCCM was consulted for evaluation of hypoxic and hypercapnic respiratory failure.  In December 2022 he was admitted for similar presentation requiring intubation and mechanical ventilation.  Pertinent  Medical History  OSA, OHS Chronic respiratory failure HFpEF  Significant Hospital Events: Including procedures, antibiotic start and stop dates in addition to other pertinent events   6/7 admission to the ICU 6/8 intubated.  6/9 still hypoxic - started on epoprostenol 6/10 fevers O2 still marginal  Interim History / Subjective:  Remains on pretty high vent settings. Heavily sedated. Ongoing copious  secretions.  Objective   Blood pressure (!) 153/66, pulse (!) 121, temperature 99.9 F (37.7 C), resp. rate (!) 22, height 6' 5" (1.956 m), weight (!) 211.4 kg, SpO2 (!) 86 %. CVP:  [68 mmHg-90 mmHg] 80 mmHg  Vent Mode: PRVC FiO2 (%):  [100 %] 100 % Set Rate:  [22 bmp] 22 bmp Vt Set:  [620 mL-640 mL] 620 mL PEEP:  [15 cmH20] 15 cmH20 Plateau Pressure:  [35 cmH20-36 cmH20] 35 cmH20   Intake/Output Summary (Last 24 hours) at 09/03/2021 0840 Last data filed at 09/03/2021 0800 Gross per 24 hour  Intake 6705.59 ml  Output 4600 ml  Net 2105.59 ml    Filed Weights   09/01/21 0500 09/02/21 0400 09/03/21 0417  Weight: (!) 205 kg (!) 212.7 kg (!) 211.4 kg   Obese sedated, bounding pulses and visible PMI Ext with minimal to no edema RASS -5 Lungs with severe rhonci bilaterally  Cr worsening with diuretic challenge  Assessment & Plan:  Overall presentation c/w ARDS from pneumonia- in patient with fragile baseline cardiopulmonary status due to weight and cor pulmonale.  Resp culture still growing.  RVP neg.  He is near dry weight and lasix likely worsening renal function.  His weight precludes safe proning.  Going to simplify approach here. Hx cocaine/crack abuse Acute renal injury Milrinone-induced SVT - DC milrinone/lasix/amiodarone, give 100g 25% albumin - Lung protective tidal volumes limiting driving pressures to < 15cm H2O as able (tried PEEP push this AM, no effect on DP or sats, will leave at 15cmH2O) - Sedation titrated to vent compliance and patient  comfort using PAD orderset; consider NMB protocol if e/o derecruitment - VAP prevention bundle - Daily SAT/SBT when meets institutional criteria - Start steroids, increase levemir - Continue veletri for now - Continue abx, f/u culture data - Guarded prognosis  Best Practice (right click and "Reselect all SmartList Selections" daily)   Diet/type: tubefeeds DVT prophylaxis: LMWH GI prophylaxis: PPI Lines: Central line Foley:   Yes, and it is still needed Code Status:  full code Last date of multidisciplinary goals of care discussion [called 6/12 and left VM for mother]  34 min cc time Erskine Emery MD PCCM

## 2021-09-03 NOTE — Procedures (Signed)
Central Venous Catheter Insertion Procedure Note  Joe Carter  [id number]  1980/05/03  Date:09/03/21  Time:4:39 PM   Provider Performing:Sharetha Newson   Procedure: Insertion of Non-tunneled Central Venous [phone]) with US guidance ([id number])   Indication(s) Medication administration  Consent Risks of the procedure as well as the alternatives and risks of each were explained to the patient and/or caregiver.  Consent for the procedure was obtained and is signed in the bedside chart  Anesthesia Topical only with 1% lidocaine   Timeout Verified patient identification, verified procedure, site/side was marked, verified correct patient position, special equipment/implants available, medications/allergies/relevant history reviewed, required imaging and test results available.  Sterile Technique Maximal sterile technique including full sterile barrier drape, hand hygiene, sterile gown, sterile gloves, mask, hair covering, sterile ultrasound probe cover (if used).  Procedure Description Area of catheter insertion was cleaned with chlorhexidine and draped in sterile fashion.  With real-time ultrasound guidance a central venous catheter was placed into the right internal jugular vein. Nonpulsatile blood flow and easy flushing noted in all ports.  The catheter was sutured in place and sterile dressing applied.  Complications/Tolerance None; patient tolerated the procedure well. Chest X-ray is ordered to verify placement for internal jugular or subclavian cannulation.   Chest x-ray is not ordered for femoral cannulation.  EBL Minimal  Specimen(s) None

## 2021-09-03 NOTE — Progress Notes (Signed)
CVP noted to be 90.  Waveform c/w aterial placement as is CXR.  Discussed with VVS: will hold heparin for a couple hours then pull.  Will place R sided IJ CVL.  Erskine Emery MD PCCM

## 2021-09-04 DIAGNOSIS — J9622 Acute and chronic respiratory failure with hypercapnia: Secondary | ICD-10-CM | POA: Diagnosis not present

## 2021-09-04 LAB — POCT I-STAT 7, (LYTES, BLD GAS, ICA,H+H)
Acid-Base Excess: 5 mmol/L — ABNORMAL HIGH (ref 0.0–2.0)
Bicarbonate: 34.7 mmol/L — ABNORMAL HIGH (ref 20.0–28.0)
Calcium, Ion: 1.18 mmol/L (ref 1.15–1.40)
HCT: 44 % (ref 39.0–52.0)
Hemoglobin: 15 g/dL (ref 13.0–17.0)
O2 Saturation: 78 %
Patient temperature: 37.9
Potassium: 4.1 mmol/L (ref 3.5–5.1)
Sodium: 147 mmol/L — ABNORMAL HIGH (ref 135–145)
TCO2: 37 mmol/L — ABNORMAL HIGH (ref 22–32)
pCO2 arterial: 76.1 mmHg (ref 32–48)
pH, Arterial: 7.272 — ABNORMAL LOW (ref 7.35–7.45)
pO2, Arterial: 53 mmHg — ABNORMAL LOW (ref 83–108)

## 2021-09-04 LAB — BASIC METABOLIC PANEL
Anion gap: 10 (ref 5–15)
Anion gap: 7 (ref 5–15)
BUN: 44 mg/dL — ABNORMAL HIGH (ref 6–20)
BUN: 57 mg/dL — ABNORMAL HIGH (ref 6–20)
CO2: 32 mmol/L (ref 22–32)
CO2: 33 mmol/L — ABNORMAL HIGH (ref 22–32)
Calcium: 8.5 mg/dL — ABNORMAL LOW (ref 8.9–10.3)
Calcium: 8.5 mg/dL — ABNORMAL LOW (ref 8.9–10.3)
Chloride: 101 mmol/L (ref 98–111)
Chloride: 107 mmol/L (ref 98–111)
Creatinine, Ser: 1.52 mg/dL — ABNORMAL HIGH (ref 0.61–1.24)
Creatinine, Ser: 1.6 mg/dL — ABNORMAL HIGH (ref 0.61–1.24)
GFR, Estimated: 58 mL/min — ABNORMAL LOW (ref >60)
GFR, Estimated: 55 mL/min — ABNORMAL LOW (ref >60)
Glucose, Bld: 273 mg/dL — ABNORMAL HIGH (ref 70–99)
Glucose, Bld: 304 mg/dL — ABNORMAL HIGH (ref 70–99)
Potassium: 4 mmol/L (ref 3.5–5.1)
Potassium: 4.4 mmol/L (ref 3.5–5.1)
Sodium: 143 mmol/L (ref 135–145)
Sodium: 147 mmol/L — ABNORMAL HIGH (ref 135–145)

## 2021-09-04 LAB — CBC
HCT: 44.8 % (ref 39.0–52.0)
Hemoglobin: 12.9 g/dL — ABNORMAL LOW (ref 13.0–17.0)
MCH: 29.6 pg (ref 26.0–34.0)
MCHC: 28.8 g/dL — ABNORMAL LOW (ref 30.0–36.0)
MCV: 102.8 fL — ABNORMAL HIGH (ref 80.0–100.0)
Platelets: 176 10*3/uL (ref 150–400)
RBC: 4.36 MIL/uL (ref 4.22–5.81)
RDW: 15.9 % — ABNORMAL HIGH (ref 11.5–15.5)
WBC: 12.8 10*3/uL — ABNORMAL HIGH (ref 4.0–10.5)
nRBC: 0 % (ref 0.0–0.2)

## 2021-09-04 LAB — URINALYSIS, ROUTINE W REFLEX MICROSCOPIC
Bacteria, UA: NONE SEEN
Bilirubin Urine: NEGATIVE
Glucose, UA: 50 mg/dL — AB
Ketones, ur: NEGATIVE mg/dL
Leukocytes,Ua: NEGATIVE
Nitrite: NEGATIVE
Protein, ur: 100 mg/dL — AB
Specific Gravity, Urine: 1.026 (ref 1.005–1.030)
pH: 5 (ref 5.0–8.0)

## 2021-09-04 LAB — COOXEMETRY PANEL
Carboxyhemoglobin: 2.8 % — ABNORMAL HIGH (ref 0.5–1.5)
Methemoglobin: <0.7 % (ref 0.0–1.5)
O2 Saturation: 78.6 %
Total hemoglobin: 12 g/dL (ref 12.0–16.0)

## 2021-09-04 LAB — GLUCOSE, CAPILLARY
Glucose-Capillary: 238 mg/dL — ABNORMAL HIGH (ref 70–99)
Glucose-Capillary: 245 mg/dL — ABNORMAL HIGH (ref 70–99)
Glucose-Capillary: 251 mg/dL — ABNORMAL HIGH (ref 70–99)
Glucose-Capillary: 299 mg/dL — ABNORMAL HIGH (ref 70–99)
Glucose-Capillary: 284 mg/dL — ABNORMAL HIGH (ref 70–99)
Glucose-Capillary: 279 mg/dL — ABNORMAL HIGH (ref 70–99)

## 2021-09-04 LAB — STREP PNEUMONIAE URINARY ANTIGEN: Strep Pneumo Urinary Antigen: NEGATIVE

## 2021-09-04 LAB — PROCALCITONIN: Procalcitonin: 8.98 ng/mL

## 2021-09-04 MED ORDER — INSULIN DETEMIR 100 UNIT/ML ~~LOC~~ SOLN
30.0000 [IU] | Freq: Two times a day (BID) | SUBCUTANEOUS | Status: DC
  Administered 2021-09-04 (×2): 30 [IU] via SUBCUTANEOUS
  Filled 2021-09-04 (×4): qty 0.3

## 2021-09-04 MED ORDER — POTASSIUM CHLORIDE 20 MEQ PO PACK
40.0000 meq | PACK | Freq: Once | ORAL | Status: AC
  Administered 2021-09-04: 40 meq
  Filled 2021-09-04: qty 2

## 2021-09-04 MED ORDER — FUROSEMIDE 10 MG/ML IJ SOLN
80.0000 mg | Freq: Three times a day (TID) | INTRAMUSCULAR | Status: AC
  Administered 2021-09-04 (×2): 80 mg via INTRAVENOUS
  Filled 2021-09-04 (×2): qty 8

## 2021-09-04 NOTE — TOC Initial Note (Signed)
Transition of Care [facility]) - Initial/Assessment Note    Patient Details  Name: Joe Carter MRN: [id number] Date of Birth: Jul 31, 1980  [facility]) CM/SW Contact:    Bethena Roys, RN Phone Number: 09/04/2021, 4:50 PM  Clinical Narrative:  Patient presented for acute respiratory failure-continues on vent. Patient has oxygen at home via [employer]. Case Manager will continue to follow for transition of care needs.                 Expected Discharge Plan: [place] Barriers to Discharge: Continued Medical Work up  Expected Discharge Plan and Services Expected Discharge Plan: [place]         Emotional Assessment     Alcohol / Substance Use: Not Applicable Psych Involvement: No (comment)  Admission diagnosis:  Hypoxia [R09.02] Acute on chronic respiratory failure with hypercapnia ([place]) [J96.22] Community acquired pneumonia, unspecified laterality [J18.9] Acute on chronic congestive heart failure, unspecified heart failure type ([place]) [I50.9] Patient Active Problem List   Diagnosis Date Noted   Pulmonary hypertension with acute cor pulmonale ([place]) 09/02/2021   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 greater ([place]) 08/31/2021   Acute heart failure with preserved ejection fraction (HFpEF) ([place]) 58/52/7782   Acute metabolic encephalopathy 42/35/3614   Stress hyperglycemia 08/31/2021   Prediabetes 08/31/2021   Community acquired pneumonia    Acute on chronic respiratory failure with hypercapnia ([place]) 09/16/2021   Acute respiratory failure with hypoxia ([place]) 03/04/2021   Acute on chronic diastolic (congestive) heart failure ([place]) 12/12/2020   Foot ulcer, right, with fat layer exposed ([place]) 11/29/2020   Diabetes mellitus ([place]) 11/29/2020   Obesity hypoventilation syndrome ([place]) 07/19/2020   Acute respiratory failure with hypoxia and hypercarbia ([place]) 07/06/2020   Morbid obesity with BMI of  60.0-69.9, adult ([place]) 04/13/2020   Neuropathy 08/01/2016   Back pain 08/01/2016   Essential hypertension 08/01/2016   PCP:  Mike Craze, DO Pharmacy:   [id number] [place] [address] [address] [place] [address]Phone: [phone]:: [phone] [facility] [address]Phone: [phone]: [phone] [place]- [address] [address] [facility] [place] [address]Phone: [phone]: [phone]  Readmission Risk Interventions     No data to display

## 2021-09-04 NOTE — Progress Notes (Signed)
RT note- Dr. Tamala Julian attempted APRV, no improvement and decrease in sp02. Patient remains on current settings.

## 2021-09-04 NOTE — Plan of Care (Signed)
  Problem: Activity: Goal: Risk for activity intolerance will decrease Outcome: Not Progressing   Problem: Nutrition: Goal: Adequate nutrition will be maintained Outcome: Progressing   Problem: Elimination: Goal: Will not experience complications related to bowel motility Outcome: Progressing   Problem: Pain Managment: Goal: General experience of comfort will improve Outcome: Progressing   Problem: Safety: Goal: Ability to remain free from injury will improve Outcome: Progressing   Problem: Skin Integrity: Goal: Risk for impaired skin integrity will decrease Outcome: Progressing   Problem: Activity: Goal: Ability to tolerate increased activity will improve Outcome: Not Progressing   Problem: Respiratory: Goal: Ability to maintain a clear airway and adequate ventilation will improve Outcome: Not Progressing

## 2021-09-04 NOTE — Progress Notes (Signed)
RT note-Increased Veletri back to 40m

## 2021-09-04 NOTE — Progress Notes (Signed)
Pharmacy Antibiotic Note  Joe Carter is a 41 y.o. male admitted on 09/14/2021 with suspected sepsis.  Pharmacy has been consulted for Cefepime and Vancomycin dosing.  Cr remains stable, blood cultures no growth so far, MRSA swab negative. Discussed with CCM - will continue cefepime for 10 days and stop vancomycin.  Plan: Cefepime 2 g IV q8h x10 days total Stop vancomycin Pharmacy will sign off and follow ABX peripherally   Temp (24hrs), Avg:100.4 F (38 C), Min:97 F (36.1 C), Max:101.1 F (38.4 C)  Recent Labs  Lab 08/31/21 0146 08/31/21 1330 09/01/21 0344 09/01/21 0505 09/02/21 0425 09/02/21 2243 09/03/21 0505 09/04/21 0408  WBC 13.9*  --   --  15.9* 14.9*  --  20.2* 12.8*  CREATININE 0.90  --  1.33*  --  1.31* 1.49* 1.60* 1.52*  LATICACIDVEN  --  1.9  --   --   --   --   --   --      Estimated Creatinine Clearance: 124.6 mL/min (A) (by C-G formula based on SCr of 1.52 mg/dL (H)).    No Known Allergies  Antimicrobials this admission: Azithro 6/7 > 6/8 CTX 6/7 > 6/8, 6/10 x1 Cefepime 6/11 > (6/20) Vancomycin 6/11 > 6/13  Dose adjustments this admission: none  Microbiology results: 6/09 TA: few WBC, few GPC in pairs/chains 6/10 TA: rare GNR, rare GPC pairs 6/11 MRSA: negative 6/11 BCx: NGTD  Thank you for allowing pharmacy to be a part of this patient's care.  Arrie Senate, PharmD, BCPS, [facility] Clinical Pharmacist [phone] Please check AMION for all [place] numbers 09/04/2021

## 2021-09-04 NOTE — Inpatient Diabetes Management (Signed)
Inpatient Diabetes Program Recommendations  AACE/ADA: New Consensus Statement on Inpatient Glycemic Control (2015)  Target Ranges:  Prepandial:   less than 140 mg/dL      Peak postprandial:   less than 180 mg/dL (1-2 hours)      Critically ill patients:  140 - 180 mg/dL   Lab Results  Component Value Date   GLUCAP 245 (H) 09/04/2021   HGBA1C 5.9 (A) 07/31/2021    Review of Glycemic Control  Latest Reference Range & Units 09/03/21 07:44 09/03/21 11:42 09/03/21 15:49 09/03/21 20:21 09/03/21 23:15 09/04/21 04:20 09/04/21 07:51  Glucose-Capillary 70 - 99 mg/dL 176 (H) 229 (H) 269 (H) 283 (H) 297 (H) 238 (H) 245 (H)  (H): Data is abnormally high  Diabetes history: DM2 Outpatient Diabetes medications: Farxiga 10 mg qd, Mounjaro 2.5 mg q week Current orders for Inpatient glycemic control: Levemir 30 units bid, Novolog 0-20 units q 4 hrs., Solumedrol 125 mg bid  Inpatient Diabetes Program Recommendations:   Please consider: -Add Novolog 3 units tube feed coverage q 4 hrs. -Decrease Novolog correction to 0-15 units q 4 hrs. Secure chat to Dr. Tamala Julian. As steroids decrease, will need to decrease Levemir.  Thank you, Nani Gasser. Luca Burston, RN, MSN, CDE  Diabetes Coordinator Inpatient Glycemic Control Team Team Pager [phone] (8am-5pm) 09/04/2021 9:30 AM

## 2021-09-04 NOTE — Progress Notes (Signed)
NAME:  Joe Carter, MRN:  [id number], DOB:  1980-07-22, LOS: 6 ADMISSION DATE:  09/20/2021, CONSULTATION DATE:  09/15/2021 REFERRING MD:  Caryl Ada PA-C/ ED, CHIEF COMPLAINT:  acute respiratory failure   History of Present Illness:  Mr. Bauserman is a 41 year old gentleman with a history of OHS, OSA, chronic respiratory failure on 3 L supplemental oxygen who presented to internal medicine clinic with dyspnea on exertion, orthopnea, worsening edema.  He had been restarted on his Lasix in May 2023 due to severe lower extremity edema.  He had not been taking this due to frequent urination that prohibit him from completing tasks at work.  In the internal medicine clinic he was saturating in the low 80s, not responsive to his home oxygen being resumed.  He was transferred to the ED.  Upon arrival he was increased supplemental oxygen, between nonrebreather for his saturations were in the 90s.  ABG with significant hypercapnia, so he was started on BiPAP.  Earlier he ripped off BiPAP and removed his IVs, attempting to leave.  When he pulled off his oxygen he desatted to the 80s again and started to feel poorly.  Currently he has BiPAP back on.  Due to chest x-ray findings concerning for multifocal pneumonia he was given ceftriaxone and azithromycin.  He was given a dose of Lasix in the ED.  PCCM was consulted for evaluation of hypoxic and hypercapnic respiratory failure.  In December 2022 he was admitted for similar presentation requiring intubation and mechanical ventilation.  Pertinent  Medical History  OSA, OHS Chronic respiratory failure HFpEF  Significant Hospital Events: Including procedures, antibiotic start and stop dates in addition to other pertinent events   6/7 admission to the ICU 6/8 intubated.  6/9 still hypoxic - started on epoprostenol 6/10 fevers O2 still marginal 6/11 onwards: continued high vent needs  Interim History / Subjective:  Off pressors. Continues to have high A-a  gradient  Objective   Blood pressure (!) 138/55, pulse (!) 110, temperature 99.5 F (37.5 C), resp. rate (!) 24, height _0  (1.956 m), weight (!) 214.1 kg, SpO2 (!) 80 %. CVP:  [8 mmHg-90 mmHg] 18 mmHg  Vent Mode: PRVC FiO2 (%):  [100 %] 100 % Set Rate:  [22 bmp-24 bmp] 24 bmp Vt Set:  [620 mL] 620 mL PEEP:  [15 cmH20-18 cmH20] 18 cmH20 Plateau Pressure:  [41 cmH20-45 cmH20] 45 cmH20   Intake/Output Summary (Last 24 hours) at 09/04/2021 0820 Last data filed at 09/04/2021 0800 Gross per 24 hour  Intake 4861.88 ml  Output 3010 ml  Net 1851.88 ml    Filed Weights   09/02/21 0400 09/03/21 0417 09/04/21 0500  Weight: (!) 212.7 kg (!) 211.4 kg (!) 214.1 kg   Heavily sedated Less rhonci today RASS -5 Thick secretions from ETT Maybe slightly more edema today?  Tough to tell  Pct improved SvO2 okay CVP around 18 but on 18 of PEEP Acidemic on ABG but cannot go up on tidal volumes due to air trapping and pressure limitations  P/F 53 from 56 yesterday  Assessment & Plan:  Overall presentation c/w ARDS from pneumonia- in patient with fragile baseline cardiopulmonary status due to weight and cor pulmonale.  He also seems to have chronic atelectasis either from old infections or weight.  Resp culture normal flora.  RVP neg x 2.  His weight precludes safe proning.   Hx cocaine/crack abuse Acute renal injury Shock state- improved - Rechallenge with lasix - Lung protective tidal volumes  limiting driving pressures to < 15cm H2O as able (tried PEEP push this AM, no effect on DP or sats, will leave at 15cmH2O) - Sedation titrated to vent compliance and patient comfort using PAD orderset; consider NMB protocol if e/o derecruitment - VAP prevention bundle - Daily SAT/SBT when meets institutional criteria - Continue steroids, increase levemir again - Continue veletri for now, attempted to wean but desaturated - Try to get Kreg bed, current bed not working; want to see if we can get into  reverse trendelenberg - DC vanc, cefepime x 7 days - Guarded prognosis relayed to family at length 09/03/21.  His O2 needs were just as bad back in December and he rallied through.  Will continue aggressive care for now.  Best Practice (right click and "Reselect all SmartList Selections" daily)   Diet/type: tubefeeds DVT prophylaxis: LMWH GI prophylaxis: PPI Lines: Central line Foley:  Yes, and it is still needed Code Status:  full code Last date of multidisciplinary goals of care discussion [done at length 6/12]  38 min cc time Erskine Emery MD PCCM

## 2021-09-04 NOTE — Progress Notes (Signed)
RT note- Patient transferred to another bed without any issues, ETT holder changed, continue to monitor.

## 2021-09-04 NOTE — Progress Notes (Signed)
PT Cancellation Note  Patient Details Name: Joe Carter MRN: [id number] DOB: [birth year]   Cancelled Treatment:    Reason Eval/Treat Not Completed: Other (comment) Acknowledged imminent, however, pt not leaving today. Spoke with RN and requesting kreg bed. Will evaluate as schedule allows for appropriateness of kreg beg.   [name], DPT  Acute Rehabilitation Services  Office: [phone]  [name] 09/04/2021, 11:50 AM

## 2021-09-05 ENCOUNTER — Inpatient Hospital Stay (HOSPITAL_COMMUNITY): Payer: 59

## 2021-09-05 DIAGNOSIS — J9622 Acute and chronic respiratory failure with hypercapnia: Secondary | ICD-10-CM | POA: Diagnosis not present

## 2021-09-05 LAB — POCT I-STAT 7, (LYTES, BLD GAS, ICA,H+H)
Acid-Base Excess: 5 mmol/L — ABNORMAL HIGH (ref 0.0–2.0)
Bicarbonate: 33.8 mmol/L — ABNORMAL HIGH (ref 20.0–28.0)
Calcium, Ion: 1.24 mmol/L (ref 1.15–1.40)
HCT: 44 % (ref 39.0–52.0)
Hemoglobin: 15 g/dL (ref 13.0–17.0)
O2 Saturation: 88 %
Patient temperature: 37.2
Potassium: 4.4 mmol/L (ref 3.5–5.1)
Sodium: 150 mmol/L — ABNORMAL HIGH (ref 135–145)
TCO2: 36 mmol/L — ABNORMAL HIGH (ref 22–32)
pCO2 arterial: 69.6 mmHg (ref 32–48)
pH, Arterial: 7.295 — ABNORMAL LOW (ref 7.35–7.45)
pO2, Arterial: 64 mmHg — ABNORMAL LOW (ref 83–108)

## 2021-09-05 LAB — CBC
HCT: 45.8 % (ref 39.0–52.0)
Hemoglobin: 13 g/dL (ref 13.0–17.0)
MCH: 29.3 pg (ref 26.0–34.0)
MCHC: 28.4 g/dL — ABNORMAL LOW (ref 30.0–36.0)
MCV: 103.2 fL — ABNORMAL HIGH (ref 80.0–100.0)
Platelets: 193 10*3/uL (ref 150–400)
RBC: 4.44 MIL/uL (ref 4.22–5.81)
RDW: 15.9 % — ABNORMAL HIGH (ref 11.5–15.5)
WBC: 12 10*3/uL — ABNORMAL HIGH (ref 4.0–10.5)
nRBC: 0 % (ref 0.0–0.2)

## 2021-09-05 LAB — GLUCOSE, CAPILLARY
Glucose-Capillary: 258 mg/dL — ABNORMAL HIGH (ref 70–99)
Glucose-Capillary: 252 mg/dL — ABNORMAL HIGH (ref 70–99)
Glucose-Capillary: 202 mg/dL — ABNORMAL HIGH (ref 70–99)
Glucose-Capillary: 301 mg/dL — ABNORMAL HIGH (ref 70–99)
Glucose-Capillary: 227 mg/dL — ABNORMAL HIGH (ref 70–99)
Glucose-Capillary: 223 mg/dL — ABNORMAL HIGH (ref 70–99)

## 2021-09-05 LAB — BASIC METABOLIC PANEL
Anion gap: 7 (ref 5–15)
BUN: 73 mg/dL — ABNORMAL HIGH (ref 6–20)
CO2: 31 mmol/L (ref 22–32)
Calcium: 8.7 mg/dL — ABNORMAL LOW (ref 8.9–10.3)
Chloride: 108 mmol/L (ref 98–111)
Creatinine, Ser: 1.53 mg/dL — ABNORMAL HIGH (ref 0.61–1.24)
GFR, Estimated: 58 mL/min — ABNORMAL LOW (ref >60)
Glucose, Bld: 284 mg/dL — ABNORMAL HIGH (ref 70–99)
Potassium: 4.4 mmol/L (ref 3.5–5.1)
Sodium: 146 mmol/L — ABNORMAL HIGH (ref 135–145)

## 2021-09-05 LAB — MAGNESIUM: Magnesium: 3.7 mg/dL — ABNORMAL HIGH (ref 1.7–2.4)

## 2021-09-05 LAB — LEGIONELLA PNEUMOPHILA SEROGP 1 UR AG: L. pneumophila Serogp 1 Ur Ag: NEGATIVE

## 2021-09-05 MED ORDER — FUROSEMIDE 10 MG/ML IJ SOLN
80.0000 mg | Freq: Three times a day (TID) | INTRAMUSCULAR | Status: AC
  Administered 2021-09-05: 80 mg via INTRAVENOUS
  Filled 2021-09-05: qty 8

## 2021-09-05 MED ORDER — METOLAZONE 5 MG PO TABS
5.0000 mg | ORAL_TABLET | Freq: Once | ORAL | Status: AC
  Administered 2021-09-05: 5 mg
  Filled 2021-09-05: qty 1

## 2021-09-05 MED ORDER — PROSOURCE TF PO LIQD
90.0000 mL | Freq: Three times a day (TID) | ORAL | Status: DC
  Administered 2021-09-05 – 2021-09-07 (×5): 90 mL
  Filled 2021-09-05 (×5): qty 90

## 2021-09-05 MED ORDER — PIVOT 1.5 CAL PO LIQD
1000.0000 mL | ORAL | Status: DC
Start: 2021-09-05 — End: 2021-09-07
  Administered 2021-09-05 – 2021-09-06 (×2): 1000 mL

## 2021-09-05 MED ORDER — INSULIN DETEMIR 100 UNIT/ML ~~LOC~~ SOLN
35.0000 [IU] | Freq: Two times a day (BID) | SUBCUTANEOUS | Status: DC
Start: 2021-09-05 — End: 2021-09-07
  Administered 2021-09-05 – 2021-09-07 (×5): 35 [IU] via SUBCUTANEOUS
  Filled 2021-09-05 (×7): qty 0.35

## 2021-09-05 NOTE — Progress Notes (Signed)
NAME:  Joe Carter, MRN:  [id number], DOB:  10/21/1980, LOS: 7 ADMISSION DATE:  09/11/2021, CONSULTATION DATE:  08/28/2021 REFERRING MD:  Caryl Ada PA-C/ ED, CHIEF COMPLAINT:  acute respiratory failure   History of Present Illness:  Mr. Appling is a 41 year old gentleman with a history of OHS, OSA, chronic respiratory failure on 3 L supplemental oxygen who presented to internal medicine clinic with dyspnea on exertion, orthopnea, worsening edema.  He had been restarted on his Lasix in May 2023 due to severe lower extremity edema.  He had not been taking this due to frequent urination that prohibit him from completing tasks at work.  In the internal medicine clinic he was saturating in the low 80s, not responsive to his home oxygen being resumed.  He was transferred to the ED.  Upon arrival he was increased supplemental oxygen, between nonrebreather for his saturations were in the 90s.  ABG with significant hypercapnia, so he was started on BiPAP.  Earlier he ripped off BiPAP and removed his IVs, attempting to leave.  When he pulled off his oxygen he desatted to the 80s again and started to feel poorly.  Currently he has BiPAP back on.  Due to chest x-ray findings concerning for multifocal pneumonia he was given ceftriaxone and azithromycin.  He was given a dose of Lasix in the ED.  PCCM was consulted for evaluation of hypoxic and hypercapnic respiratory failure.  In December 2022 he was admitted for similar presentation requiring intubation and mechanical ventilation.  Pertinent  Medical History  OSA, OHS Chronic respiratory failure HFpEF  Significant Hospital Events: Including procedures, antibiotic start and stop dates in addition to other pertinent events   6/7 admission to the ICU 6/8 intubated.  6/9 still hypoxic - started on epoprostenol 6/10 fevers O2 still marginal 6/11 onwards: continued high vent needs 6/14 sats improved to high 80s   Interim History / Subjective:  Off pressors Sats  88-90   Objective   Blood pressure 134/75, pulse 94, temperature 98.8 F (37.1 C), resp. rate (!) 24, height 6' 5" (1.956 m), weight (!) 217 kg, SpO2 91 %. CVP:  [11 mmHg-18 mmHg] 13 mmHg  Vent Mode: PRVC FiO2 (%):  [100 %] 100 % Set Rate:  [24 bmp] 24 bmp Vt Set:  [620 mL] 620 mL PEEP:  [18 cmH20] 18 cmH20 Plateau Pressure:  [40 cmH20-44 cmH20] 42 cmH20   Intake/Output Summary (Last 24 hours) at 09/05/2021 0926 Last data filed at 09/05/2021 0800 Gross per 24 hour  Intake 2763.6 ml  Output 2960 ml  Net -196.4 ml   Filed Weights   09/03/21 0417 09/04/21 0500 09/05/21 0500  Weight: (!) 211.4 kg (!) 214.1 kg (!) 217 kg    Assessment & Plan:    Acute on chronic respiratory failure with hypoxemia and hypercarbia, severe ARDS--  PNA, atelectasis, obesity, cor pulmonale   -fragile baseline cardiopulmonary status due to weight and cor pulmonale.  He also seems to have chronic atelectasis either from old infections or weight.  Resp culture normal flora.  RVP neg x 2.  His weight precludes safe proning.  P -cont steroids  -will give 2g mag -- wheezy on exam, not hypomagnesemic. Cont brovana, pulmicort, yupelri   -cont epo -vent wise -- difficult spot. Discussing weaning sedation and trying on a PS mode. Will wean sedation for RASS 0 to -1 before trial -cont diuresis -- lasix 45m q8 -cefepime for 7d course   AKI, improving Hypernatremia, mild P -trend renal  indices, UOP -lasix as above   Hyperglycemia  P -incr levemir to 35 units BID from 30 units Bid -cont rSSI   Hx cocaine/crack abuse -cessation counseling when appropriate    GOC - Guarded prognosis relayed to family at length 09/03/21 by Dr. Tamala Julian CCM.  His O2 needs were just as bad back in December and he rallied through.  Will continue aggressive care for now.  Best Practice (right click and "Reselect all SmartList Selections" daily)   Diet/type: tubefeeds DVT prophylaxis: LMWH GI prophylaxis: PPI Lines: Central  line, Arterial Line, and yes and it is still needed Foley:  Yes, and it is still needed Code Status:  full code Last date of multidisciplinary goals of care discussion [done at length 6/12]  CRITICAL CARE Performed by: Cristal Generous   Total critical care time: 42 minutes  Critical care time was exclusive of separately billable procedures and treating other patients. Critical care was necessary to treat or prevent imminent or life-threatening deterioration.  Critical care was time spent personally by me on the following activities: development of treatment plan with patient and/or surrogate as well as nursing, discussions with consultants, evaluation of patient's response to treatment, examination of patient, obtaining history from patient or surrogate, ordering and performing treatments and interventions, ordering and review of laboratory studies, ordering and review of radiographic studies, pulse oximetry and re-evaluation of patient's condition.  Eliseo Gum MSN, AGACNP-BC [place] for pager  09/05/2021, 9:26 AM

## 2021-09-05 NOTE — Plan of Care (Signed)
Tried to reach mother to provide updates, no answer  Eliseo Gum MSN, AGACNP-BC [place] 09/05/2021, 11:25 AM

## 2021-09-05 NOTE — Progress Notes (Signed)
Internal Medicine Clinic Attending  I saw and evaluated the patient.  I personally confirmed the key portions of the history and exam documented by Dr. Eulas Post and I reviewed pertinent patient test results.  The assessment, diagnosis, and plan were formulated together and I agree with the documentation in the resident's note.

## 2021-09-05 NOTE — Progress Notes (Signed)
Nutrition Brief Note  Contacted by Pharmacy, Vital 1.5 is currently out of stock.  TF orders adjusted  Tube feeding via OG tube: Piovt 1.5 at 55 ml/h (1320 ml per day) Prosource TF 90 ml TID  Provides 2220 kcal, 189 gm protein, 1001 ml free water daily  Joe Wonnacott P., RD, LDN, CNSC See AMiON for contact information

## 2021-09-05 NOTE — Progress Notes (Signed)
RT note-re attempted to wean to SIMV, still not breathing over the set rate of 10, placed back to [facility]. Will re attempt, continue to monitor.

## 2021-09-05 NOTE — Progress Notes (Signed)
PT Cancellation Note  Patient Details Name: Joe Carter MRN: [id number] DOB: 1980/03/28   Cancelled Treatment:    Reason Eval/Treat Not Completed: Patient not medically ready. PT orders noted to include assessing pt for Kreg bed, but pt currently already on Kreg bed. Pt with high vent settings of 100% FiO2, PEEP 18, PRVC setting and RN reporting pt is sedated and not withdrawing to painful stimuli. PT will hold at this time and plan to follow-up on Friday pending pt's medical status.   Moishe Spice, PT, DPT Acute Rehabilitation Services  Office: [place] 09/05/2021, 8:33 AM

## 2021-09-06 ENCOUNTER — Inpatient Hospital Stay (HOSPITAL_COMMUNITY): Payer: 59

## 2021-09-06 DIAGNOSIS — J9622 Acute and chronic respiratory failure with hypercapnia: Secondary | ICD-10-CM | POA: Diagnosis not present

## 2021-09-06 LAB — POCT I-STAT 7, (LYTES, BLD GAS, ICA,H+H)
Acid-Base Excess: 4 mmol/L — ABNORMAL HIGH (ref 0.0–2.0)
Bicarbonate: 33.9 mmol/L — ABNORMAL HIGH (ref 20.0–28.0)
Calcium, Ion: 1.31 mmol/L (ref 1.15–1.40)
HCT: 46 % (ref 39.0–52.0)
Hemoglobin: 15.6 g/dL (ref 13.0–17.0)
O2 Saturation: 91 %
Patient temperature: 36.7
Potassium: 4.6 mmol/L (ref 3.5–5.1)
Sodium: 152 mmol/L — ABNORMAL HIGH (ref 135–145)
TCO2: 36 mmol/L — ABNORMAL HIGH (ref 22–32)
pCO2 arterial: 70.3 mmHg (ref 32–48)
pH, Arterial: 7.289 — ABNORMAL LOW (ref 7.35–7.45)
pO2, Arterial: 69 mmHg — ABNORMAL LOW (ref 83–108)

## 2021-09-06 LAB — BASIC METABOLIC PANEL
Anion gap: 10 (ref 5–15)
BUN: 84 mg/dL — ABNORMAL HIGH (ref 6–20)
CO2: 31 mmol/L (ref 22–32)
Calcium: 9.3 mg/dL (ref 8.9–10.3)
Chloride: 110 mmol/L (ref 98–111)
Creatinine, Ser: 1.32 mg/dL — ABNORMAL HIGH (ref 0.61–1.24)
GFR, Estimated: >60 mL/min (ref >60)
Glucose, Bld: 252 mg/dL — ABNORMAL HIGH (ref 70–99)
Potassium: 4.6 mmol/L (ref 3.5–5.1)
Sodium: 151 mmol/L — ABNORMAL HIGH (ref 135–145)

## 2021-09-06 LAB — CBC
HCT: 48.4 % (ref 39.0–52.0)
Hemoglobin: 14 g/dL (ref 13.0–17.0)
MCH: 29.7 pg (ref 26.0–34.0)
MCHC: 28.9 g/dL — ABNORMAL LOW (ref 30.0–36.0)
MCV: 102.8 fL — ABNORMAL HIGH (ref 80.0–100.0)
Platelets: 223 10*3/uL (ref 150–400)
RBC: 4.71 MIL/uL (ref 4.22–5.81)
RDW: 15.9 % — ABNORMAL HIGH (ref 11.5–15.5)
WBC: 10.9 10*3/uL — ABNORMAL HIGH (ref 4.0–10.5)
nRBC: 0 % (ref 0.0–0.2)

## 2021-09-06 LAB — GLUCOSE, CAPILLARY
Glucose-Capillary: 255 mg/dL — ABNORMAL HIGH (ref 70–99)
Glucose-Capillary: 226 mg/dL — ABNORMAL HIGH (ref 70–99)
Glucose-Capillary: 166 mg/dL — ABNORMAL HIGH (ref 70–99)
Glucose-Capillary: 183 mg/dL — ABNORMAL HIGH (ref 70–99)
Glucose-Capillary: 228 mg/dL — ABNORMAL HIGH (ref 70–99)

## 2021-09-06 LAB — MAGNESIUM: Magnesium: 3.7 mg/dL — ABNORMAL HIGH (ref 1.7–2.4)

## 2021-09-06 MED ORDER — METHYLPREDNISOLONE SODIUM SUCC 125 MG IJ SOLR
125.0000 mg | INTRAMUSCULAR | Status: DC
Start: 2021-09-06 — End: 2021-09-07
  Administered 2021-09-06 – 2021-09-07 (×2): 125 mg via INTRAVENOUS
  Filled 2021-09-06 (×2): qty 2

## 2021-09-06 MED ORDER — METOLAZONE 5 MG PO TABS
5.0000 mg | ORAL_TABLET | Freq: Once | ORAL | Status: AC
  Administered 2021-09-06: 5 mg
  Filled 2021-09-06: qty 1

## 2021-09-06 MED ORDER — INSULIN ASPART 100 UNIT/ML IJ SOLN
4.0000 [IU] | INTRAMUSCULAR | Status: DC
  Administered 2021-09-06 – 2021-09-07 (×8): 4 [IU] via SUBCUTANEOUS

## 2021-09-06 MED ORDER — FUROSEMIDE 10 MG/ML IJ SOLN
80.0000 mg | Freq: Three times a day (TID) | INTRAMUSCULAR | Status: AC
  Administered 2021-09-06 (×2): 80 mg via INTRAVENOUS
  Filled 2021-09-06: qty 8

## 2021-09-06 MED ORDER — DEXMEDETOMIDINE HCL IN NACL 400 MCG/100ML IV SOLN
0.4000 ug/kg/h | INTRAVENOUS | Status: DC
  Administered 2021-09-06: 0.4 ug/kg/h via INTRAVENOUS
  Administered 2021-09-06 (×2): 0.6 ug/kg/h via INTRAVENOUS
  Administered 2021-09-07: 0.8 ug/kg/h via INTRAVENOUS
  Administered 2021-09-07 (×3): 0.6 ug/kg/h via INTRAVENOUS
  Administered 2021-09-07: 0.8 ug/kg/h via INTRAVENOUS
  Administered 2021-09-07: 0.6 ug/kg/h via INTRAVENOUS
  Administered 2021-09-07 – 2021-09-08 (×2): 0.7 ug/kg/h via INTRAVENOUS
  Administered 2021-09-08 (×2): 0.5 ug/kg/h via INTRAVENOUS
  Administered 2021-09-08: 0.7 ug/kg/h via INTRAVENOUS
  Filled 2021-09-06 (×4): qty 100
  Filled 2021-09-06: qty 200
  Filled 2021-09-06 (×11): qty 100

## 2021-09-06 MED ORDER — FUROSEMIDE 10 MG/ML IJ SOLN
80.0000 mg | Freq: Two times a day (BID) | INTRAMUSCULAR | Status: DC
  Filled 2021-09-06: qty 8

## 2021-09-06 NOTE — Progress Notes (Signed)
NAME:  Joe Carter, MRN:  [id number], DOB:  July 06, 1980, LOS: 8 ADMISSION DATE:  09/01/2021, CONSULTATION DATE:  08/30/2021 REFERRING MD:  Caryl Ada PA-C/ ED, CHIEF COMPLAINT:  acute respiratory failure   History of Present Illness:  Joe Carter is a 41 year old gentleman with a history of OHS, OSA, chronic respiratory failure on 3 L supplemental oxygen who presented to internal medicine clinic with dyspnea on exertion, orthopnea, worsening edema.  He had been restarted on his Lasix in May 2023 due to severe lower extremity edema.  He had not been taking this due to frequent urination that prohibit him from completing tasks at work.  In the internal medicine clinic he was saturating in the low 80s, not responsive to his home oxygen being resumed.  He was transferred to the ED.  Upon arrival he was increased supplemental oxygen, between nonrebreather for his saturations were in the 90s.  ABG with significant hypercapnia, so he was started on BiPAP.  Earlier he ripped off BiPAP and removed his IVs, attempting to leave.  When he pulled off his oxygen he desatted to the 80s again and started to feel poorly.  Currently he has BiPAP back on.  Due to chest x-ray findings concerning for multifocal pneumonia he was given ceftriaxone and azithromycin.  He was given a dose of Lasix in the ED.  PCCM was consulted for evaluation of hypoxic and hypercapnic respiratory failure.  In December 2022 he was admitted for similar presentation requiring intubation and mechanical ventilation.  Pertinent  Medical History  OSA, OHS Chronic respiratory failure HFpEF  Significant Hospital Events: Including procedures, antibiotic start and stop dates in addition to other pertinent events   6/7 admission to the ICU 6/8 intubated.  6/9 still hypoxic - started on epoprostenol 6/10 fevers O2 still marginal 6/11 onwards: continued high vent needs 6/14 sats improved to high 80s   Interim History / Subjective:  Off pressors Sats  88-90   Objective   Blood pressure (!) 159/74, pulse (!) 106, temperature (!) 97.3 F (36.3 C), resp. rate 18, height _0  (1.956 m), weight (!) 216.7 kg, SpO2 90 %. CVP:  [11 mmHg-20 mmHg] 16 mmHg  Vent Mode: SIMV;PRVC;PSV FiO2 (%):  [100 %] 100 % Set Rate:  [16 bmp-24 bmp] 16 bmp Vt Set:  [620 mL-650 mL] 620 mL PEEP:  [18 cmH20] 18 cmH20 Plateau Pressure:  [31 cmH20-42 cmH20] 31 cmH20   Intake/Output Summary (Last 24 hours) at 09/06/2021 1005 Last data filed at 09/06/2021 0600 Gross per 24 hour  Intake 1689.43 ml  Output 3650 ml  Net -1960.57 ml   Filed Weights   09/04/21 0500 09/05/21 0500 09/06/21 0500  Weight: (!) 214.1 kg (!) 217 kg (!) 216.7 kg    Assessment & Plan:      Acute on chronic respiratory failure w hypoxemia and hypercarbia, severe ARDS PNA, atelectasis, cor pulmonale, small bilateral pleural effusions, pulmonary edema  -fragile baseline cardiopulmonary status due to weight and cor pulmonale.  He also seems to have chronic atelectasis either from old infections or weight.  Resp culture normal flora.  RVP neg x 2.  His weight precludes safe proning.  -shunt physiology  P -cont steroids  -Cont brovana, pulmicort, yupelri   -difficult spot vent wise. Trying to lighten sedation and trial an SIMV mode to see if his resp effort will help reduce burden  -Cont epo (not weaning this until able to wean vent settings)  -cont diuresis -- lasix 46m q8 +  metolazone. Dosing daily based on renal fxn  -cefepime for 7d course   AKI improving P -trend renal indices, UOP -lasix as above   Hyperglycemia  P -levemir + rSSI  Hx cocaine/crack use -cessation counseling when appropriate. Hugely detrimental for already tenuous baseline lung status    GOC - Guarded prognosis relayed to family at length 09/03/21 by Dr. Tamala Julian CCM.  His O2 needs were just as bad back in December and he rallied through.  Will continue aggressive care for now.  Best Practice (right click and  "Reselect all SmartList Selections" daily)   Diet/type: tubefeeds DVT prophylaxis: LMWH GI prophylaxis: PPI Lines: Central line, Arterial Line, and yes and it is still needed Foley:  Yes, and it is still needed Code Status:  full code Last date of multidisciplinary goals of care discussion [done at length 6/12]  CRITICAL CARE Performed by: Cristal Generous   Total critical care time: 38 minutes  Critical care time was exclusive of separately billable procedures and treating other patients. Critical care was necessary to treat or prevent imminent or life-threatening deterioration.  Critical care was time spent personally by me on the following activities: development of treatment plan with patient and/or surrogate as well as nursing, discussions with consultants, evaluation of patient's response to treatment, examination of patient, obtaining history from patient or surrogate, ordering and performing treatments and interventions, ordering and review of laboratory studies, ordering and review of radiographic studies, pulse oximetry and re-evaluation of patient's condition.  Eliseo Gum MSN, AGACNP-BC [place] for pager  09/06/2021, 10:05 AM

## 2021-09-06 NOTE — Progress Notes (Signed)
Nutrition Follow-up  DOCUMENTATION CODES:   Not applicable  INTERVENTION:  Continue tube feeding via OG tube: Piovt 1.5 at 55 ml/h (1320 ml per day) Prosource TF 90 ml TID   Provides 2220 kcal, 189 gm protein, 1001 ml free water daily  Recommend increasing bowel regimen given small and infrequent BM's  NUTRITION DIAGNOSIS:   Inadequate oral intake related to inability to eat as evidenced by NPO status.  Ongoing  GOAL:   Provide needs based on ASPEN/SCCM guidelines  Ongoing  MONITOR:   Vent status, Labs, Weight trends, TF tolerance, I & O's  REASON FOR ASSESSMENT:   Ventilator, Consult Enteral/tube feeding initiation and management  ASSESSMENT:   41 year old male who presented to the ED on 6/07 with SOB and LE edema. PMH of CHF (noncompliant with diuretics), OHS, OSA, chronic respiratory failure on supplemental O2. Pt admitted with acute on chronic respiratory failure, acute on chronic CHF.  06/09-intubated   Failed vent weaning trial yesterday. Plans to push diuretics another day and lighten sedation and try for SIMV.  Spoke with RN. Pt tolerating tube feeding. Tube feedings infusing at foal rate. No observed distension. Noted last documented BM on 6/11. She states that he had a small BM today.  Patient is currently intubated on ventilator support MV: 14 L/min Temp (24hrs), Avg:98.5 F (36.9 C), Min:97.3 F (36.3 C), Max:99.5 F (37.5 C) BP: 161/61 MAP: 91  Reviewed wt. Current wt is +4kg since 06/09. Likely d/t volume status d/t CHF and presence of edema.   Edema: mild pitting generalized, non-pitting BLE  Drips: NaCl, precedex, abx, fentanyl  Medications: IV lasix, SSI 0-20 units Q4H, SSI 4 units Q4H, levemir 35 units BID, IV solu-medrol, IV protonix, miralax, senna  Labs: sodium 152, BUN 84, Cr 1.32, Mg 3.7, CBG's 202-301 x24 hours  UOP: 4061m x24 hours I/O's: -50989msince admission  Diet Order:   Diet Order             Diet NPO time  specified  Diet effective now                   EDUCATION NEEDS:   Not appropriate for education at this time  Skin:  Skin Assessment: Reviewed RN Assessment  Last BM:  6/11  Height:   Ht Readings from Last 1 Encounters:  09/01/21 6' 5" (1.956 m)    Weight:   Wt Readings from Last 1 Encounters:  09/06/21 (!) 216.7 kg    Ideal Body Weight:  94.5 kg  BMI:  Body mass index is 56.65 kg/m.  Estimated Nutritional Needs:   Kcal:  2200-2400  Protein:  185-200 grams  Fluid:  2.0 L  Joe DanaRDN, LDN Clinical Nutrition

## 2021-09-07 ENCOUNTER — Inpatient Hospital Stay (HOSPITAL_COMMUNITY): Payer: 59

## 2021-09-07 DIAGNOSIS — J80 Acute respiratory distress syndrome: Secondary | ICD-10-CM

## 2021-09-07 DIAGNOSIS — Z515 Encounter for palliative care: Secondary | ICD-10-CM | POA: Diagnosis not present

## 2021-09-07 DIAGNOSIS — E870 Hyperosmolality and hypernatremia: Secondary | ICD-10-CM | POA: Diagnosis not present

## 2021-09-07 DIAGNOSIS — N179 Acute kidney failure, unspecified: Secondary | ICD-10-CM | POA: Diagnosis not present

## 2021-09-07 DIAGNOSIS — J9622 Acute and chronic respiratory failure with hypercapnia: Secondary | ICD-10-CM | POA: Diagnosis not present

## 2021-09-07 DIAGNOSIS — Z7189 Other specified counseling: Secondary | ICD-10-CM

## 2021-09-07 LAB — CBC
HCT: 51.3 % (ref 39.0–52.0)
Hemoglobin: 14.2 g/dL (ref 13.0–17.0)
MCH: 28.7 pg (ref 26.0–34.0)
MCHC: 27.7 g/dL — ABNORMAL LOW (ref 30.0–36.0)
MCV: 103.8 fL — ABNORMAL HIGH (ref 80.0–100.0)
Platelets: 237 10*3/uL (ref 150–400)
RBC: 4.94 MIL/uL (ref 4.22–5.81)
RDW: 15.6 % — ABNORMAL HIGH (ref 11.5–15.5)
WBC: 14.3 10*3/uL — ABNORMAL HIGH (ref 4.0–10.5)
nRBC: 0 % (ref 0.0–0.2)

## 2021-09-07 LAB — URINE DRUGS OF ABUSE SCREEN W ALC, ROUTINE (REF LAB)
Amphetamines, Urine: NEGATIVE ng/mL
Barbiturate, Ur: NEGATIVE ng/mL
Cannabinoid Quant, Ur: NEGATIVE ng/mL
Ethanol U, Quan: NEGATIVE %
Methadone Screen, Urine: NEGATIVE ng/mL
Opiate Quant, Ur: NEGATIVE ng/mL
Phencyclidine, Ur: NEGATIVE ng/mL
Propoxyphene, Urine: NEGATIVE ng/mL

## 2021-09-07 LAB — POCT I-STAT 7, (LYTES, BLD GAS, ICA,H+H)
Acid-Base Excess: 8 mmol/L — ABNORMAL HIGH (ref 0.0–2.0)
Bicarbonate: 38.5 mmol/L — ABNORMAL HIGH (ref 20.0–28.0)
Calcium, Ion: 1.28 mmol/L (ref 1.15–1.40)
HCT: 48 % (ref 39.0–52.0)
Hemoglobin: 16.3 g/dL (ref 13.0–17.0)
O2 Saturation: 86 %
Patient temperature: 37
Potassium: 4.3 mmol/L (ref 3.5–5.1)
Sodium: 157 mmol/L — ABNORMAL HIGH (ref 135–145)
TCO2: 41 mmol/L — ABNORMAL HIGH (ref 22–32)
pCO2 arterial: 79.8 mmHg (ref 32–48)
pH, Arterial: 7.291 — ABNORMAL LOW (ref 7.35–7.45)
pO2, Arterial: 60 mmHg — ABNORMAL LOW (ref 83–108)

## 2021-09-07 LAB — MAGNESIUM: Magnesium: 3.4 mg/dL — ABNORMAL HIGH (ref 1.7–2.4)

## 2021-09-07 LAB — BASIC METABOLIC PANEL
Anion gap: 10 (ref 5–15)
BUN: 93 mg/dL — ABNORMAL HIGH (ref 6–20)
CO2: 34 mmol/L — ABNORMAL HIGH (ref 22–32)
Calcium: 9.3 mg/dL (ref 8.9–10.3)
Chloride: 111 mmol/L (ref 98–111)
Creatinine, Ser: 1.27 mg/dL — ABNORMAL HIGH (ref 0.61–1.24)
GFR, Estimated: >60 mL/min (ref >60)
Glucose, Bld: 171 mg/dL — ABNORMAL HIGH (ref 70–99)
Potassium: 4.4 mmol/L (ref 3.5–5.1)
Sodium: 155 mmol/L — ABNORMAL HIGH (ref 135–145)

## 2021-09-07 LAB — GLUCOSE, CAPILLARY
Glucose-Capillary: 125 mg/dL — ABNORMAL HIGH (ref 70–99)
Glucose-Capillary: 145 mg/dL — ABNORMAL HIGH (ref 70–99)
Glucose-Capillary: 157 mg/dL — ABNORMAL HIGH (ref 70–99)
Glucose-Capillary: 174 mg/dL — ABNORMAL HIGH (ref 70–99)
Glucose-Capillary: 202 mg/dL — ABNORMAL HIGH (ref 70–99)

## 2021-09-07 LAB — CULTURE, BLOOD (ROUTINE X 2)
Culture: NO GROWTH
Culture: NO GROWTH
Special Requests: ADEQUATE
Special Requests: ADEQUATE

## 2021-09-07 LAB — DRUG PROFILE 799031: BENZODIAZEPINES: NEGATIVE

## 2021-09-07 LAB — COCAINE CONF, UR
Benzoylecgonine GC/MS Conf: 288 ng/mL
Cocaine Metab Quant, Ur: POSITIVE — AB

## 2021-09-07 MED ORDER — MORPHINE SULFATE (PF) 2 MG/ML IV SOLN: 2.0000 mg | INTRAVENOUS | Status: DC | PRN

## 2021-09-07 MED ORDER — MORPHINE 100MG IN NS 100ML (1MG/ML) PREMIX INFUSION
0.0000 mg/h | INTRAVENOUS | Status: DC
  Administered 2021-09-08: 5 mg/h via INTRAVENOUS
  Filled 2021-09-07 (×2): qty 100

## 2021-09-07 MED ORDER — MORPHINE BOLUS VIA INFUSION: 5.0000 mg | INTRAVENOUS | Status: DC | PRN

## 2021-09-07 MED ORDER — GLYCOPYRROLATE 1 MG PO TABS: 1.0000 mg | ORAL_TABLET | ORAL | Status: DC | PRN

## 2021-09-07 MED ORDER — DIPHENHYDRAMINE HCL 50 MG/ML IJ SOLN: 25.0000 mg | INTRAMUSCULAR | Status: DC | PRN

## 2021-09-07 MED ORDER — GLYCOPYRROLATE 0.2 MG/ML IJ SOLN
0.2000 mg | INTRAMUSCULAR | Status: DC | PRN
  Administered 2021-09-08: 0.2 mg via INTRAVENOUS
  Filled 2021-09-07: qty 1

## 2021-09-07 MED ORDER — ACETAMINOPHEN 325 MG PO TABS: 650.0000 mg | ORAL_TABLET | Freq: Four times a day (QID) | ORAL | Status: DC | PRN

## 2021-09-07 MED ORDER — ACETAMINOPHEN 650 MG RE SUPP: 650.0000 mg | Freq: Four times a day (QID) | RECTAL | Status: DC | PRN

## 2021-09-07 MED ORDER — ACETAMINOPHEN 160 MG/5ML PO SOLN
650.0000 mg | ORAL | Status: DC | PRN
  Administered 2021-09-07 – 2021-09-08 (×4): 650 mg
  Filled 2021-09-07 (×4): qty 20.3

## 2021-09-07 MED ORDER — SODIUM CHLORIDE 0.9 % IV SOLN
1.0000 g | Freq: Three times a day (TID) | INTRAVENOUS | Status: DC
  Administered 2021-09-07 – 2021-09-08 (×3): 1 g via INTRAVENOUS
  Filled 2021-09-07 (×6): qty 20

## 2021-09-07 MED ORDER — POLYVINYL ALCOHOL 1.4 % OP SOLN: 1.0000 [drp] | Freq: Four times a day (QID) | OPHTHALMIC | Status: DC | PRN

## 2021-09-07 MED ORDER — MIDAZOLAM HCL 2 MG/2ML IJ SOLN: 1.0000 mg | INTRAMUSCULAR | Status: DC | PRN

## 2021-09-07 MED ORDER — GLYCOPYRROLATE 0.2 MG/ML IJ SOLN: 0.2000 mg | INTRAMUSCULAR | Status: DC | PRN

## 2021-09-07 MED ORDER — ALBUTEROL SULFATE (2.5 MG/3ML) 0.083% IN NEBU: 2.5000 mg | INHALATION_SOLUTION | RESPIRATORY_TRACT | Status: DC | PRN

## 2021-09-07 MED ORDER — MIDAZOLAM BOLUS VIA INFUSION (WITHDRAWAL LIFE SUSTAINING TX)
2.0000 mg | INTRAVENOUS | Status: DC | PRN
  Administered 2021-09-08: 2 mg via INTRAVENOUS

## 2021-09-07 MED ORDER — MIDAZOLAM-SODIUM CHLORIDE 100-0.9 MG/100ML-% IV SOLN
0.0000 mg/h | INTRAVENOUS | Status: DC
  Administered 2021-09-07: 1 mg/h via INTRAVENOUS
  Filled 2021-09-07: qty 100

## 2021-09-07 NOTE — Progress Notes (Signed)
09/07/21 1341  Clinical Encounter Type  Visited With Patient and family together  Visit Type Initial (Support for family)  Referral From Nurse  Consult/Referral To Chaplain  Spiritual Encounters  Spiritual Needs Emotional;Grief support   Chaplain responded to Nurse Myriam Jacobson via page requesting support for family who will place loved one comfort care. Chaplain encountered the family in the [place] lobby who indicated that they are waiting on the patient's children. Chaplain provided emotional support and demonstrated concern.  (Family is well supported by clergy members within family).   Melody Haver, Resident Chaplain [phone]

## 2021-09-07 NOTE — IPAL (Signed)
  Interdisciplinary Goals of Care Family Meeting   Date carrieNurse Practitioner, Bedside Registered Nurse, and Family Member or next of kind out: 09/07/2021  Location of the meeting: Bedside  Member's involved: Nurse Practitioner, Bedside Registered Nurse, and Family Member or next of kin  Lyerly or acting medical decision maker: Mother, Sonia Baller    Discussion: We discussed goals of care for [employer] .  Discussed clinical decline for Mr. Staniszewski. A compassionate decision to focus our medical efforts on symptom management at the end of life & comfort care was reached.  Comfort focus to start now. Extubation pending family readiness (calling 1 other fam member to see if they would like chance to visit)  Code status: Full DNR  Disposition: In-patient comfort care  Time spent for the meeting: [place], NP 09/07/2021, 1:25 PM

## 2021-09-07 NOTE — Progress Notes (Signed)
PT Cancellation Note  Patient Details Name: Joe Carter MRN: [id number] DOB: 1980-05-31   Cancelled Treatment:    Reason Eval/Treat Not Completed: Patient not medically ready; remains on full vent support, not tolerating sedation weaning. Will sign off; please reconsult when medically appropriate for PT Evaluation.  Mabeline Caras, PT, DPT Acute Rehabilitation Services  Pager [phone] Office [place] 09/07/2021, 8:28 AM

## 2021-09-07 NOTE — Consult Note (Signed)
Palliative Medicine Inpatient Consult Note  Consulting Provider: Cristal Generous, NP  Reason for consult:   [facility] Palliative Medicine Consult  Reason for Consult? Likely non-survivable illness. Limited success in reaching family & really need to set up a fam mtg for Joe Carter    09/07/2021  HPI:  Per intake H&P --> [name] is a 41 year old gentleman with a history of OHS, OSA, chronic respiratory failure on 3 L supplemental oxygen who presented to internal medicine clinic with dyspnea on exertion, orthopnea, worsening edema.  Has been tx in ICU for ARDS though noted to be very slowly making improvements. Palliative care has been asked to get involved to arrange a formal family meeting for further goals of care discussions.   Clinical Assessment/Goals of Care:  *Please note that this is a verbal dictation therefore any spelling or grammatical errors are due to the "[place]" system interpretation.  I have reviewed medical records including EPIC notes, labs and imaging, received report from bedside RN, assessed the patient.    Critical Care NP, Shirlee Limerick, RN, Myriam Jacobson, myself and patients mother, grandfather, brother met at bedside to further discuss diagnosis prognosis, GOC, EOL wishes, disposition and options.   I introduced Palliative Medicine as specialized medical care for people living with serious illness. It focuses on providing relief from the symptoms and stress of a serious illness. The goal is to improve quality of life for both the patient and the family.  Shirlee Limerick was able to speak to patient's family regarding the reality that patient remains to have fevers, is on full ventilatory support, and is overall not making a turn for the better.  She shares that unfortunately although all efforts have been made to improve patient's condition he is not turning the corner.  She described that even continuing all of this aggressive medical care will be unlikely to  change the outcome.  Discussed the idea of keeping Joe Carter comfortable. We talked about transition to comfort measures in house and what that would entail inclusive of medications to control pain, dyspnea, agitation, nausea, itching, and hiccups.  We discussed stopping all uneccessary measures such as ventilatory support, cardiac monitoring, blood draws, needle sticks, and frequent vital signs. Utilized reflective listening throughout our time together.   Shirlee Limerick patient/family to consider DNR/DNI status understanding evidenced based poor outcomes in similar hospitalized patient, as the cause of arrest is likely associated with advanced chronic/terminal illness rather than an easily reversible acute cardio-pulmonary event.  She shared that Joe Carter would be unlikely to survive this and we will likely cause him more harm than benefit in the event of such an episode.  She advocated strongly for DO NOT RESUSCITATE CODE STATUS which family was in agreement with.  Family was obviously overwhelmed by emotions considering the difficulty of these conversations.  Patient's mother very clearly shares she does not want to see him like this anymore and does not desire to contribute to his suffering.  Patient's family have decided to transition him to comfort measures.  Shirlee Limerick reviewed the medications which will be utilized to aid in support him inclusive of opioids and benzodiazepines.  Discussed the importance of inviting any and all family members/friends who are important to Joe Carter prior to compassionate extubation so that they may have the opportunity say goodbye.  Plan for compassionate extubation tomorrow.  Decision Maker: Joe Carter, Joe Carter (Mother): [phone] ([place])  [place] for compassionate extubation tomorrow  No additional labs, procedures, diagnostics or escalation  of care  Patient will maintain on fentanyl and Versed  Chaplain to provide spiritual aid  Ongoing  palliative support  Code Status/Advance Care Planning: DNAR  Palliative Prophylaxis:  Aspiration, Bowel Regimen, Delirium Protocol, Frequent Pain Assessment, Oral Care, Palliative Wound Care, and Turn Reposition  Additional Recommendations (Limitations, Scope, Preferences): Continue current care-plan to transition to comfort tomorrow  Psycho-social/Spiritual:  Desire for further Chaplaincy support: Joe Carter is religious Additional Recommendations: Recommendation towards DO NOT RESUSCITATE CODE STATUS and comfort oriented care   Prognosis: Overall very poor patient has a high likelihood of further decompensation and passing on present measures. Once extubated anticipate hours.   Discharge Planning: Discharge will be celestial.  Vitals:   09/07/21 1009 09/07/21 1038  BP:    Pulse:    Resp:    Temp:    SpO2: 91% 90%    Intake/Output Summary (Last 24 hours) at 09/07/2021 1042 Last data filed at 09/07/2021 0500 Gross per 24 hour  Intake 2124.38 ml  Output 5100 ml  Net -2975.62 ml   Last Weight  Most recent update: 09/07/2021  6:03 AM    Weight  214.6 kg (473 lb 1.7 oz)              Gen:  AA M in moderate distress HEENT: ETT, dry mucous membranes CV: Irregular rate and rhythm PULM: Mechanical vent ABD: soft/nontender EXT: Gen edema Neuro: Sedated  PPS: 10%   This conversation/these recommendations were discussed with patient primary care team, Dr. Shearon Stalls and Eliseo Gum   Billing based on MDM: High  Problems Addressed: One acute or chronic illness or injury that poses a threat to life or bodily function  Amount and/or Complexity of Data: Category 3:Discussion of management or test interpretation with external physician/other qualified health care professional/appropriate source (not separately reported)  Risks: Decision not to resuscitate or to de-escalate care because of poor prognosis ______________________________________________________ [place] Team Team Cell Phone: [phone] Please utilize secure chat with additional questions, if there is no response within 30 minutes please call the above phone number  Palliative Medicine Team providers are available by phone from 7am to 7pm daily and can be reached through the team cell phone.  Should this patient require assistance outside of these hours, please call the patient's attending physician.

## 2021-09-07 NOTE — Progress Notes (Signed)
NAME:  Joe Carter, MRN:  [id number], DOB:  10-16-1980, LOS: 9 ADMISSION DATE:  09/14/2021, CONSULTATION DATE:  09/18/2021 REFERRING MD:  Caryl Ada PA-C/ ED, CHIEF COMPLAINT:  acute respiratory failure   History of Present Illness:  Joe Carter is a 41 year old gentleman with a history of OHS, OSA, chronic respiratory failure on 3 L supplemental oxygen who presented to internal medicine clinic with dyspnea on exertion, orthopnea, worsening edema.  He had been restarted on his Lasix in May 2023 due to severe lower extremity edema.  He had not been taking this due to frequent urination that prohibit him from completing tasks at work.  In the internal medicine clinic he was saturating in the low 80s, not responsive to his home oxygen being resumed.  He was transferred to the ED.  Upon arrival he was increased supplemental oxygen, between nonrebreather for his saturations were in the 90s.  ABG with significant hypercapnia, so he was started on BiPAP.  Earlier he ripped off BiPAP and removed his IVs, attempting to leave.  When he pulled off his oxygen he desatted to the 80s again and started to feel poorly.  Currently he has BiPAP back on.  Due to chest x-ray findings concerning for multifocal pneumonia he was given ceftriaxone and azithromycin.  He was given a dose of Lasix in the ED.  PCCM was consulted for evaluation of hypoxic and hypercapnic respiratory failure.  In December 2022 he was admitted for similar presentation requiring intubation and mechanical ventilation.  Pertinent  Medical History  OSA, OHS Chronic respiratory failure HFpEF  Significant Hospital Events: Including procedures, antibiotic start and stop dates in addition to other pertinent events   6/7 admission to the ICU 6/8 intubated.  6/9 still hypoxic - started on epoprostenol 6/10 fevers O2 still marginal 6/11 onwards: continued high vent needs 6/14 sats improved to high 80s  6/15 tried decr sedation and SIMV/PRVC 6/16 CXR  worse ARDS. Uncomfortable breathing w sedation wean. Incr sedation and asking palli to help coordinate family mtg. Spiking fevers,   Interim History / Subjective:  Desats with any de-recruitment, dyssynchrony.  Still on epo Fio2 100% and PEEP 18 on SIMV PRVC   Spiking fevers, still on cefepime   Objective   Blood pressure 116/60, pulse 99, temperature 99.1 F (37.3 C), resp. rate (!) 26, height _0  (1.956 m), weight (!) 214.6 kg, SpO2 (!) 88 %. CVP:  [8 mmHg-29 mmHg] 11 mmHg  Vent Mode: SIMV;PRVC;PSV FiO2 (%):  [100 %] 100 % Set Rate:  [16 bmp] 16 bmp Vt Set:  [620 mL] 620 mL PEEP:  [18 cmH20] 18 cmH20 Pressure Support:  [12 cmH20] 12 cmH20 Plateau Pressure:  [0 ATF57-32 cmH20] 0 cmH20   Intake/Output Summary (Last 24 hours) at 09/07/2021 0819 Last data filed at 09/07/2021 0500 Gross per 24 hour  Intake 2274.37 ml  Output 5100 ml  Net -2825.63 ml   Filed Weights   09/05/21 0500 09/06/21 0500 09/07/21 0500  Weight: (!) 217 kg (!) 216.7 kg (!) 214.6 kg    Assessment & Plan:    Acute on chronic respiratory failure w hypoxemia and hypercarbia Severe ARDS, worse  PNA, atelectasis, pulmonary edema -fragile baseline cardiopulmonary status due to weight and cor pulmonale.  He also seems to have chronic atelectasis either from old infections or weight.  Resp culture normal flora.  RVP neg x 2.  His weight precludes safe proning.  -shunt physiology  P -remains on high vent settings despite several  trials of different modes/sedation.  -Cont epo (not weaning this until able to wean vent settings)  -not sure at this point if NMB would be helpful consideration or not  -With fever spikes and worse CXR, broadening abx from cefepime to mero  -hold diuresis 6/16   AKI  Hypernatremia  P -trend renal indices uop   Hyperglycemia  P -levemir + rSSI  Hx cocaine/crack use -cessation counseling when appropriate. Hugely detrimental for already tenuous baseline lung status    GOC -  Guarded prognosis relayed to family at length 09/03/21 by Dr. Tamala Julian CCM.  His O2 needs were just as bad back in December and he rallied through.   -Several attempts 6/14 and 6/15 to update/discuss w pts mother have not been successful. ARDS is worsening 6/16. Placing palliative consult for assistance in coordinating family mtg, despite prior successful hospitalization I am concerned with the worsening trend in the patient's current status and worry this may not be survivable   Best Practice (right click and "Reselect all SmartList Selections" daily)   Diet/type: tubefeeds DVT prophylaxis: LMWH GI prophylaxis: PPI Lines: Central line, Arterial Line, and yes and it is still needed Foley:  Yes, and it is still needed Code Status:  full code Last date of multidisciplinary goals of care discussion [done at length 6/12]  CRITICAL CARE Performed by: Cristal Generous   Total critical care time: 40 minutes  Critical care time was exclusive of separately billable procedures and treating other patients. Critical care was necessary to treat or prevent imminent or life-threatening deterioration.  Critical care was time spent personally by me on the following activities: development of treatment plan with patient and/or surrogate as well as nursing, discussions with consultants, evaluation of patient's response to treatment, examination of patient, obtaining history from patient or surrogate, ordering and performing treatments and interventions, ordering and review of laboratory studies, ordering and review of radiographic studies, pulse oximetry and re-evaluation of patient's condition.  Joe Gum MSN, AGACNP-BC [place] for pager 09/07/2021, 11:11 AM

## 2021-09-08 ENCOUNTER — Inpatient Hospital Stay (HOSPITAL_COMMUNITY): Payer: 59

## 2021-09-08 DIAGNOSIS — Z7189 Other specified counseling: Secondary | ICD-10-CM | POA: Diagnosis not present

## 2021-09-08 DIAGNOSIS — Z515 Encounter for palliative care: Secondary | ICD-10-CM | POA: Diagnosis not present

## 2021-09-08 DIAGNOSIS — I509 Heart failure, unspecified: Secondary | ICD-10-CM | POA: Diagnosis not present

## 2021-09-08 DIAGNOSIS — G9341 Metabolic encephalopathy: Secondary | ICD-10-CM

## 2021-09-08 DIAGNOSIS — U071 COVID-19: Secondary | ICD-10-CM | POA: Diagnosis not present

## 2021-09-08 DIAGNOSIS — R0902 Hypoxemia: Secondary | ICD-10-CM | POA: Diagnosis not present

## 2021-09-08 DIAGNOSIS — E662 Morbid (severe) obesity with alveolar hypoventilation: Secondary | ICD-10-CM

## 2021-09-08 LAB — BASIC METABOLIC PANEL
Anion gap: 8 (ref 5–15)
BUN: 109 mg/dL — ABNORMAL HIGH (ref 6–20)
CO2: 33 mmol/L — ABNORMAL HIGH (ref 22–32)
Calcium: 8.9 mg/dL (ref 8.9–10.3)
Chloride: 119 mmol/L — ABNORMAL HIGH (ref 98–111)
Creatinine, Ser: 1.56 mg/dL — ABNORMAL HIGH (ref 0.61–1.24)
GFR, Estimated: 57 mL/min — ABNORMAL LOW (ref >60)
Glucose, Bld: 145 mg/dL — ABNORMAL HIGH (ref 70–99)
Potassium: 4.3 mmol/L (ref 3.5–5.1)
Sodium: 160 mmol/L — ABNORMAL HIGH (ref 135–145)

## 2021-09-08 LAB — HEPATIC FUNCTION PANEL
ALT: 19 U/L (ref 0–44)
AST: 17 U/L (ref 15–41)
Albumin: 2.6 g/dL — ABNORMAL LOW (ref 3.5–5.0)
Alkaline Phosphatase: 50 U/L (ref 38–126)
Bilirubin, Direct: 0.7 mg/dL — ABNORMAL HIGH (ref 0.0–0.2)
Indirect Bilirubin: 0.9 mg/dL (ref 0.3–0.9)
Total Bilirubin: 1.6 mg/dL — ABNORMAL HIGH (ref 0.3–1.2)
Total Protein: 7.8 g/dL (ref 6.5–8.1)

## 2021-09-08 LAB — GLUCOSE, CAPILLARY: Glucose-Capillary: 128 mg/dL — ABNORMAL HIGH (ref 70–99)

## 2021-09-08 LAB — MAGNESIUM: Magnesium: 3.5 mg/dL — ABNORMAL HIGH (ref 1.7–2.4)

## 2021-09-08 MED ORDER — ORAL CARE MOUTH RINSE: 15.0000 mL | OROMUCOSAL | Status: DC | PRN

## 2021-09-08 MED ORDER — FENTANYL CITRATE PF 50 MCG/ML IJ SOSY: 50.0000 ug | PREFILLED_SYRINGE | INTRAMUSCULAR | Status: DC | PRN

## 2021-09-17 ENCOUNTER — Encounter: Payer: Self-pay | Admitting: Internal Medicine

## 2021-09-22 NOTE — Progress Notes (Signed)
Patient extubated to room air per withdrawal order and family's request.  Family and RN at bedside.

## 2021-09-22 NOTE — Progress Notes (Addendum)
Palliative Medicine Inpatient Follow Up Note  HPI: Joe Carter is a 41 year old gentleman with a history of OHS, OSA, chronic respiratory failure on 3 L supplemental oxygen who presented to internal medicine clinic with dyspnea on exertion, orthopnea, worsening edema.  Has been tx in ICU for ARDS though noted to be very slowly making improvements. Palliative care has been asked to get involved to arrange a formal family meeting for further goals of care discussions.   Today's Discussion October 03, 2021  *Please note that this is a verbal dictation therefore any spelling or grammatical errors are due to the "[place]" system interpretation.  Chart reviewed inclusive of vital signs, progress notes, laboratory results, and diagnostic images.   I met with patients RN, Joe Carter this morning. She shares that patients daughter was able to visit last night. We reviewed the plan for likely compassionate extubation today if family is prepared.   Comfort care orders have been reviewed inclusive of controlled substance drips.   I have called patients mother, Joe Carter though reached VM. I will continue to reach out throughout the day to answer an additional questions/concerns. ____________________________ Addendum:  I called patients mother, Joe Carter after speaking to patient Rn Joe Carter that [place] had a change in that he was satting in the 80's on full support. I called and requested that family come in to bedside.  ____________________________ Addendum 2:  Family came to bedside in the later afternoon. [place] wanted to speak to them regarding organ donation.   Family thereafter continued the desire to proceed with the plan for compassionate extubation. This will be pursued this evening.  Objective Assessment: Vital Signs Vitals:   10/03/21 0733 10-03-21 0800  BP: (!) 94/49 (!) 93/55  Pulse: 87 89  Resp: (!) 24 (!) 22  Temp: (!) 102.2 F (39 C) (!) 102.2 F (39 C)  SpO2: (!) 85% (!) 84%     Intake/Output Summary (Last 24 hours) at Oct 03, 2021 0816 Last data filed at 10-03-2021 0400 Gross per 24 hour  Intake 2266.45 ml  Output 3603 ml  Net -1336.55 ml   Last Weight  Most recent update: 2021-10-03  4:56 AM    Weight  217.2 kg (478 lb 13.4 oz)              Gen:  AA M in moderate distress HEENT: ETT, dry mucous membranes CV: Irregular rate and rhythm PULM: Mechanical vent ABD: soft/nontender EXT: Gen edema Neuro: Sedated  SUMMARY OF RECOMMENDATIONS   DNAR   Plan for compassionate extubation once family is ready   No additional labs, procedures, diagnostics or escalation of care   Patient will maintain on fentanyl and versed at this time though once transitioned to comfort will transition to morphine   Chaplain to provide spiritual aid   Ongoing palliative support  Billing based on MDM: High --> Review of controlled substances. Modifications as needed.  ______________________________________________________________________________________ [place] Team Team Cell Phone: [phone] Please utilize secure chat with additional questions, if there is no response within 30 minutes please call the above phone number  Palliative Medicine Team providers are available by phone from 7am to 7pm daily and can be reached through the team cell phone.  Should this patient require assistance outside of these hours, please call the patient's attending physician.

## 2021-09-22 NOTE — Progress Notes (Signed)
Expiratory hepa filters changed.

## 2021-09-22 NOTE — Death Summary Note (Signed)
DEATH SUMMARY   Patient Details  Name: Joe Carter MRN: [id number] DOB: Apr 26, 1980  Admission/Discharge Information   Admit Date:  09/28/2021  Date of Death: Date of Death: 10-08-21  Time of Death: Time of Death: 94  Length of Stay: 06/03/22  Referring Physician: Mike Craze, DO   Reason(s) for Hospitalization  Respiratory Failure  Diagnoses  Preliminary cause of death: Acute on chronic Hypoxemic and hypercapnic respiratory failure secondary to severe ARDS Community Acquired Pneumonia  Secondary Diagnoses (including complications and co-morbidities):  Principal Problem:   Acute on chronic respiratory failure with hypercapnia ([place]) Active Problems:   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 greater ([place])   Acute heart failure with preserved ejection fraction (HFpEF) ([place])   Acute metabolic encephalopathy   Stress hyperglycemia   Community acquired pneumonia   Pulmonary hypertension with acute cor pulmonale ([place])   Brief Hospital Course (including significant findings, care, treatment, and services provided and events leading to death)  Joe Carter is a 41 y.o. year old male with a history of OHS, OSA, chronic respiratory failure on 3 L supplemental oxygen who presented to internal medicine clinic with dyspnea on exertion, orthopnea, worsening edema.  He had been restarted on his Lasix in May 2023 due to severe lower extremity edema.  He had not been taking this due to frequent urination that prohibit him from completing tasks at work.  In the internal medicine clinic he was saturating in the low 80s, not responsive to his home oxygen being resumed.  He was transferred to the ED.  Upon arrival he was increased supplemental oxygen, between nonrebreather for his saturations were in the 90s.  ABG with significant hypercapnia, so he was started on BiPAP.  Earlier he ripped off BiPAP and removed his IVs, attempting to leave.  When he pulled off his  oxygen he desatted to the 80s again and started to feel poorly.  Currently he has BiPAP back on.  Due to chest x-ray findings concerning for multifocal pneumonia he was given ceftriaxone and azithromycin.  He was given a dose of Lasix in the ED.  PCCM was consulted for evaluation of hypoxic and hypercapnic respiratory failure.  In December 2022 he was admitted for similar presentation requiring intubation and mechanical ventilation. Over the course of the next week he developed progressive ARDS despite all aggressive therapies including inhaled pulmonary vasodilators, deep sedation, broad spectrum antibiotics. He failed to improve despite all aggressive measures and his family elected to make him comfort care. He was compassionately extubated on 10/08/2021 and died with family at bedside.     Pertinent Labs and Studies  Significant Diagnostic Studies DG CHEST PORT 1 VIEW  Result Date: 2021-10-08 CLINICAL DATA:  41 year old male with ARDS. EXAM: PORTABLE CHEST 1 VIEW COMPARISON:  Portable chest 09/07/2021 and earlier. FINDINGS: Portable AP semi upright view at 0609 hours. Stable lines and tubes. Moderate to severe widespread bilateral pulmonary opacity, although less confluent and more sub solid now compared to yesterday. Improved visualization of mediastinal contours which seem to remain normal. No pneumothorax or pleural effusion. No acute osseous abnormality identified. IMPRESSION: 1.  Stable lines and tubes. 2. ARDS with mildly improved ventilation since yesterday. Electronically Signed   By: Genevie Ann M.D.   On: 10/08/2021 08:47   DG CHEST PORT 1 VIEW  Result Date: 09/07/2021 CLINICAL DATA:  Air ds in a 41 year old male. EXAM: PORTABLE CHEST 1 VIEW COMPARISON:  September 06, 2021. FINDINGS: Cardiomediastinal  contours and hilar structures largely obscured by interstitial airspace disease which is diffuse and worse than on the prior study. Cardiomediastinal contours are grossly stable accounting for degree of  limited assessment. Endotracheal tube 3.6 cm from the carina. RIGHT-sided central venous access catheter via IJ approach terminating in the area of the mid SVC. EKG leads project over the chest. Gastric tube courses through the radiograph, tip off the field of view. As stated before near confluent a airspace disease now present particularly with worsening in the upper chest bilaterally. On limited assessment no acute skeletal finding. IMPRESSION: 1. Marked worsening of interstitial and airspace disease particularly in the upper chest bilaterally. Findings could certainly represent worsening ARDS and/or pneumonia. 2. Stable support lines and tubes. Electronically Signed   By: Zetta Bills M.D.   On: 09/07/2021 08:25   DG Chest Port 1 View  Result Date: 09/06/2021 CLINICAL DATA:  Ventilator dependent respiratory failure. EXAM: PORTABLE CHEST 1 VIEW COMPARISON:  Portable chest yesterday at 5:35 a.m. FINDINGS: 5:39 a.m., 09/06/2021. ETT tip is 4.7 cm from the carina, NGT enters the stomach with the intragastric portion not imaged. Right IJ central line tip remains in the mid SVC. There is stable cardiomegaly. There is perihilar vascular congestion with worsening perihilar edema with increasing perihilar alveolar opacities extending outward into through to the periphery in the mid and lower lung fields. There are small but also increased pleural effusions. Underlying pneumonic component not excluded. The upper lung fields show no new abnormality. Linear scarring or atelectasis in the right upper lobe appears similar. IMPRESSION: There is worsening airspace disease favored to represent alveolar pulmonary edema. Underlying pneumonia is not excluded. There are increased small pleural effusions. Support devices as above. Electronically Signed   By: Telford Nab M.D.   On: 09/06/2021 07:37   DG Chest Port 1 View  Result Date: 09/05/2021 CLINICAL DATA:  ARDS EXAM: PORTABLE CHEST 1 VIEW COMPARISON:  Multiple prior  chest x-rays. The most recent is 09/03/2021 FINDINGS: The endotracheal tube, NG tube and right IJ catheters are in good position and unchanged. The cardiac silhouette, mediastinal and hilar contours are within normal limits and stable. Persistent diffuse interstitial and airspace process in the lungs but slight improved aeration compared to the most recent chest x-ray. No pleural effusions or pneumothorax. IMPRESSION: 1. Stable support apparatus. 2. Slight improved aeration. Electronically Signed   By: Marijo Sanes M.D.   On: 09/05/2021 07:54   DG CHEST PORT 1 VIEW  Result Date: 09/03/2021 CLINICAL DATA:  Central line placement. EXAM: PORTABLE CHEST 1 VIEW COMPARISON:  Chest x-ray 09/03/2021. FINDINGS: Left IJ catheter has been removed. There is a new right sided central venous catheter in place with distal tip overlying the SVC. Enteric tube is partially visualized. Endotracheal tube tip is 3.2 cm above the carina. Bilateral mid and lower lung airspace disease has not significantly changed. No pleural effusion or pneumothorax identified. Cardiomediastinal silhouette is stable. Osseous structures are unchanged. IMPRESSION: 1. New right-sided central venous catheter tip projects over the SVC. 2. Left IJ catheter has been removed. 3. Stable bilateral airspace disease. Electronically Signed   By: Ronney Asters M.D.   On: 09/03/2021 17:59   ECHOCARDIOGRAM LIMITED  Result Date: 09/03/2021    ECHOCARDIOGRAM LIMITED REPORT   Patient Name:   Joe Carter Date of Exam: 09/03/2021 Medical Rec #:  [id number]      Height:       77.0 in Accession #:    [id number]  Weight:       466.0 lb Date of Birth:  [birth year]      BSA:          3.204 m Patient Age:    40 years       BP:           105/56 mmHg Patient Gender: M              HR:           123 bpm. Exam Location:  Inpatient Procedure: Limited Echo, Color Doppler and Cardiac Doppler Indications:    I50.9* Heart failure (unspecified)  History:        Patient has prior  history of Echocardiogram examinations, most                 recent 08/31/2021. Pulmonary HTN; Risk Factors:Hypertension and                 Diabetes.  Sonographer:    Raquel Sarna Senior RDCS Referring Phys: [id number] JESSICA MARSHALL  Sonographer Comments: Very difficult study due to patient body habitus and lung disease. Scanned supine on artificial respirator. IMPRESSIONS  1. Very challenging windows. Cannot fully assess wall motion. Left ventricular ejection fraction, by estimation, is 70 to 75%. The left ventricle has hyperdynamic function.  2. Right ventricular systolic function is normal. The right ventricular size is not well visualized. Tricuspid regurgitation signal is inadequate for assessing PA pressure.  3. The inferior vena cava is normal in size with greater than 50% respiratory variability, suggesting right atrial pressure of 3 mmHg. Conclusion(s)/Recommendation(s): Recommend repeat limited echo once patient's hemodynamics/clinical status has improved. FINDINGS  Left Ventricle: Very challenging windows. Cannot fully assess wall motion. Left ventricular ejection fraction, by estimation, is 70 to 75%. The left ventricle has hyperdynamic function. Right Ventricle: The right ventricular size is not well visualized. Right ventricular systolic function is normal. Tricuspid regurgitation signal is inadequate for assessing PA pressure. Pericardium: Trivial pericardial effusion is present. Tricuspid Valve: Tricuspid valve regurgitation is not demonstrated. Aortic Valve: Aortic valve mean gradient measures 17.0 mmHg. Aortic valve peak gradient measures 32.5 mmHg. Aortic valve area, by VTI measures 2.49 cm. Venous: The inferior vena cava is normal in size with greater than 50% respiratory variability, suggesting right atrial pressure of 3 mmHg. LEFT VENTRICLE PLAX 2D LVOT diam:     2.20 cm LV SV:         86 LV SV Index:   27 LVOT Area:     3.80 cm  RIGHT VENTRICLE RV S prime:     20.90 cm/s TAPSE (M-mode): 2.3 cm  AORTIC VALVE                     PULMONIC VALVE AV Area (Vmax):    2.17 cm      RVOT Peak grad: 4 mmHg AV Area (Vmean):   2.42 cm AV Area (VTI):     2.49 cm AV Vmax:           285.00 cm/s AV Vmean:          193.000 cm/s AV VTI:            0.347 m AV Peak Grad:      32.5 mmHg AV Mean Grad:      17.0 mmHg LVOT Vmax:         163.00 cm/s LVOT Vmean:        123.000 cm/s LVOT VTI:  0.227 m LVOT/AV VTI ratio: 0.65  SHUNTS Systemic VTI:  0.23 m Systemic Diam: 2.20 cm Pulmonic VTI:  0.155 m Phineas Inches Electronically signed by Phineas Inches Signature Date/Time: 09/03/2021/8:46:38 AM    Final    DG Chest Port 1 View  Result Date: 09/03/2021 CLINICAL DATA:  Respiratory failure EXAM: PORTABLE CHEST 1 VIEW COMPARISON:  Chest radiograph from one day prior. FINDINGS: Endotracheal tube tip is 3.4 cm above the carina. Left internal jugular central venous catheter terminates in the region of the AP window, unchanged. Enteric tube enters stomach with the tip not seen on this image. Stable cardiomediastinal silhouette with normal heart size. No pneumothorax. Stable small right pleural effusion. Extensive patchy opacity throughout the bilateral lungs, most prominent in the lower lungs bilaterally, worsened on the right. IMPRESSION: 1. Well-positioned endotracheal and enteric tubes.  No pneumothorax. 2. Left internal jugular central venous catheter terminates in stable position in the region of the AP window in the left mediastinum, presumably within an accessory left sided mediastinal vein as seen on prior chest CT angiogram study. 3. Extensive patchy opacity throughout the bilateral lungs, most prominent in the lower lungs bilaterally, worsened on the right, compatible with multifocal pneumonia or ARDS. Electronically Signed   By: Ilona Sorrel M.D.   On: 09/03/2021 07:27   DG Chest Port 1 View  Result Date: 09/02/2021 CLINICAL DATA:  Shortness of breath. EXAM: PORTABLE CHEST 1 VIEW COMPARISON:  Chest radiograph dated  09/01/2021. FINDINGS: Stable support apparatus. Bilateral pulmonary opacities, progressed on the right. Small pleural effusions may be present. No pneumothorax. Stable cardiomegaly. No acute osseous pathology. IMPRESSION: Bilateral pulmonary opacities, progressed on the right compared to the prior radiograph. Electronically Signed   By: Anner Crete M.D.   On: 09/02/2021 22:56   DG CHEST PORT 1 VIEW  Result Date: 09/01/2021 CLINICAL DATA:  Pneumonia EXAM: PORTABLE CHEST - 1 VIEW COMPARISON:  08/31/2021 FINDINGS: Endotracheal tube, left IJ central venous catheter, and nasogastric tube remain in place. The basilar airspace opacities, appearing slightly worse on the left, and improved on the right, since prior study. Stable central pulmonary vascular congestion. Heart size and mediastinal contours are within normal limits. No definite effusion. Visualized bones unremarkable. IMPRESSION: 1. Improving right, and slightly worsening left lower lung airspace opacities. 2. Support hardware stable. Electronically Signed   By: Lucrezia Europe M.D.   On: 09/01/2021 10:04   ECHOCARDIOGRAM COMPLETE  Result Date: 08/31/2021    ECHOCARDIOGRAM REPORT   Patient Name:   Joe Carter Date of Exam: 08/31/2021 Medical Rec #:  [id number]      Height:       77.0 in Accession #:    [id number]     Weight:       469.0 lb Date of Birth:  [birth year]      BSA:          3.213 m Patient Age:    67 years       BP:           101/54 mmHg Patient Gender: M              HR:           72 bpm. Exam Location:  Inpatient Procedure: 2D Echo, Cardiac Doppler, Color Doppler and Intracardiac            Opacification Agent Indications:    I48.91* Unspeicified atrial fibrillation  History:        Patient has prior history of Echocardiogram  examinations. CHF,                 Signs/Symptoms:Hypotension; Risk Factors:Hypertension and                 Diabetes.  Sonographer:    Roseanna Rainbow RDCS Referring Phys: [id number] Francesca Jewett  Sonographer Comments:  Technically difficult study due to poor echo windows, suboptimal parasternal window, suboptimal apical window, suboptimal subcostal window, patient is morbidly obese, echo performed with patient supine and on artificial respirator and Technically challenging study due to limited acoustic windows. Image acquisition challenging due to patient body habitus. IMPRESSIONS  1. Left ventricular ejection fraction, by estimation, is 55 to 60%. The left ventricle has normal function. The left ventricle has no regional wall motion abnormalities. There is mild left ventricular hypertrophy. Left ventricular diastolic parameters are consistent with Grade II diastolic dysfunction (pseudonormalization).  2. D-shaped interventricular septum is suggestive of a degree of RV pressure/volume overload. Right ventricular systolic function is mildly reduced. The right ventricular size is mildly enlarged. Tricuspid regurgitation signal is inadequate for assessing PA pressure.  3. The mitral valve is normal in structure. No evidence of mitral valve regurgitation. No evidence of mitral stenosis.  4. The aortic valve was not well visualized. Aortic valve regurgitation is not visualized. No aortic stenosis is present.  5. The inferior vena cava is dilated in size with <50% respiratory variability, suggesting right atrial pressure of 15 mmHg. FINDINGS  Left Ventricle: Left ventricular ejection fraction, by estimation, is 55 to 60%. The left ventricle has normal function. The left ventricle has no regional wall motion abnormalities. Definity contrast agent was given IV to delineate the left ventricular  endocardial borders. The left ventricular internal cavity size was normal in size. There is mild left ventricular hypertrophy. Left ventricular diastolic parameters are consistent with Grade II diastolic dysfunction (pseudonormalization). Right Ventricle: D-shaped interventricular septum is suggestive of a degree of RV pressure/volume overload. The  right ventricular size is mildly enlarged. No increase in right ventricular wall thickness. Right ventricular systolic function is mildly reduced. Tricuspid regurgitation signal is inadequate for assessing PA pressure. Left Atrium: Left atrial size was normal in size. Right Atrium: Right atrial size was normal in size. Pericardium: There is no evidence of pericardial effusion. Mitral Valve: The mitral valve is normal in structure. No evidence of mitral valve regurgitation. No evidence of mitral valve stenosis. Tricuspid Valve: The tricuspid valve is normal in structure. Tricuspid valve regurgitation is not demonstrated. Aortic Valve: The aortic valve was not well visualized. Aortic valve regurgitation is not visualized. No aortic stenosis is present. Pulmonic Valve: The pulmonic valve was not well visualized. Pulmonic valve regurgitation is not visualized. Aorta: The aortic root is normal in size and structure. Venous: The inferior vena cava is dilated in size with less than 50% respiratory variability, suggesting right atrial pressure of 15 mmHg. IAS/Shunts: No atrial level shunt detected by color flow Doppler.  LEFT VENTRICLE PLAX 2D LVIDd:         4.60 cm      Diastology LVIDs:         3.20 cm      LV e' medial:    7.72 cm/s LV PW:         1.50 cm      LV E/e' medial:  11.8 LV IVS:        1.20 cm      LV e' lateral:   13.80 cm/s LVOT diam:  2.50 cm      LV E/e' lateral: 6.6 LV SV:         125 LV SV Index:   39 LVOT Area:     4.91 cm  LV Volumes (MOD) LV vol d, MOD A2C: 155.0 ml LV vol d, MOD A4C: 118.0 ml LV vol s, MOD A2C: 70.0 ml LV vol s, MOD A4C: 51.5 ml LV SV MOD A2C:     85.0 ml LV SV MOD A4C:     118.0 ml LV SV MOD BP:      74.1 ml RIGHT VENTRICLE             IVC RV S prime:     14.50 cm/s  IVC diam: 3.10 cm TAPSE (M-mode): 2.0 cm LEFT ATRIUM             Index        RIGHT ATRIUM           Index LA diam:        3.80 cm 1.18 cm/m   RA Area:     20.10 cm LA Vol (A2C):   69.6 ml 21.66 ml/m  RA Volume:    56.50 ml  17.58 ml/m LA Vol (A4C):   52.5 ml 16.34 ml/m LA Biplane Vol: 63.4 ml 19.73 ml/m  AORTIC VALVE LVOT Vmax:   139.00 cm/s LVOT Vmean:  87.600 cm/s LVOT VTI:    0.255 m  AORTA Ao Root diam: 3.20 cm Ao Asc diam:  3.00 cm MITRAL VALVE MV Area (PHT): 3.15 cm    SHUNTS MV Decel Time: 241 msec    Systemic VTI:  0.26 m MV E velocity: 90.80 cm/s  Systemic Diam: 2.50 cm MV A velocity: 56.10 cm/s MV E/A ratio:  1.62 Dalton McleanMD Electronically signed by Franki Monte Signature Date/Time: 08/31/2021/5:41:39 PM    Final    VAS [place] LOWER EXTREMITY VENOUS (DVT)  Result Date: 08/31/2021  Lower Venous DVT Study Patient Name:  Joe Carter  Date of Exam:   08/30/2021 Medical Rec #: [id number]       Accession #:    [id number] Date of Birth: 1981/03/04       Patient Gender: M Patient Age:   54 years Exam Location:  [facility] Procedure:      VAS [place] LOWER EXTREMITY VENOUS (DVT) Referring Phys: Eddie Dibbles HOFFMAN --------------------------------------------------------------------------------  Indications: Swelling.  Comparison Study: 03/05/21, negative for DVT bilaterally. Performing Technologist: Bobetta Lime BS, RVT Supporting Technologist: Sharion Dove RVS  Examination Guidelines: A complete evaluation includes B-mode imaging, spectral Doppler, color Doppler, and power Doppler as needed of all accessible portions of each vessel. Bilateral testing is considered an integral part of a complete examination. Limited examinations for reoccurring indications may be performed as noted. The reflux portion of the exam is performed with the patient in reverse Trendelenburg.  +---------+---------------+---------+-----------+----------+--------------+ RIGHT    CompressibilityPhasicitySpontaneityPropertiesThrombus Aging +---------+---------------+---------+-----------+----------+--------------+ CFV      Full           Yes      Yes                                  +---------+---------------+---------+-----------+----------+--------------+ SFJ      Full                                                        +---------+---------------+---------+-----------+----------+--------------+  FV Prox  Full                                                        +---------+---------------+---------+-----------+----------+--------------+ FV Mid   Full                                                        +---------+---------------+---------+-----------+----------+--------------+ FV DistalFull                                                        +---------+---------------+---------+-----------+----------+--------------+ POP      Full           Yes      Yes                                 +---------+---------------+---------+-----------+----------+--------------+ PTV      Full                                                        +---------+---------------+---------+-----------+----------+--------------+ PERO     Full                                                        +---------+---------------+---------+-----------+----------+--------------+   +---------+---------------+---------+-----------+----------+--------------+ LEFT     CompressibilityPhasicitySpontaneityPropertiesThrombus Aging +---------+---------------+---------+-----------+----------+--------------+ CFV      Full           Yes      Yes                                 +---------+---------------+---------+-----------+----------+--------------+ SFJ      Full                                                        +---------+---------------+---------+-----------+----------+--------------+ FV Prox  Full                                                        +---------+---------------+---------+-----------+----------+--------------+ FV Mid   Full                                                         +---------+---------------+---------+-----------+----------+--------------+  FV DistalFull                                                        +---------+---------------+---------+-----------+----------+--------------+ PFV      Full                                                        +---------+---------------+---------+-----------+----------+--------------+ POP      Full           Yes      Yes                                 +---------+---------------+---------+-----------+----------+--------------+ PTV      Full                                                        +---------+---------------+---------+-----------+----------+--------------+ PERO     Full                                                        +---------+---------------+---------+-----------+----------+--------------+     Summary: RIGHT: - There is no evidence of deep vein thrombosis in the lower extremity.  - No cystic structure found in the popliteal fossa.  LEFT: - There is no evidence of deep vein thrombosis in the lower extremity.  - No cystic structure found in the popliteal fossa.  *See table(s) above for measurements and observations. Electronically signed by Servando Snare MD on 08/31/2021 at 2:56:03 PM.    Final    DG Abd Portable 1V  Result Date: 08/31/2021 CLINICAL DATA:  Encounter for orogastric tube placement EXAM: PORTABLE ABDOMEN - 1 VIEW COMPARISON:  03/04/2021 FINDINGS: Limited study due to body habitus. The enteric tube loops in the stomach with tip near the gastric outlet. No gross bowel obstructive pattern. IMPRESSION: Enteric tube with tip near the gastric outlet. Electronically Signed   By: Jorje Guild M.D.   On: 08/31/2021 11:37   DG Chest Port 1 View  Result Date: 08/31/2021 CLINICAL DATA:  [id number] left IJ central venous catheter placement, to check for the position of the catheter EXAM: PORTABLE CHEST 1 VIEW COMPARISON:  Study done on the same day earlier at 6:30 a.m. FINDINGS:  There has been interval left transjugular central venous catheter placement with its tip appears to be at the lower SVC region. The catheter at the level of the head of the clavicle is not well delineated. No pneumothorax. Endotracheal and NG tube are stable, though the distal end of the NG tube is not well delineated. Again seen in the right pleural effusion and pulmonary edema. The opacification of tseen at the right upper lobe has partially resolved. Cardiomegaly. The visualized skeletal structures are unremarkable. IMPRESSION: There has been interval insertion of left transjugular central venous  catheter with its tip at the lower SVC region. No pneumothorax. Endotracheal and NG tube are stable. Cardiomegaly, right pleural effusion and pulmonary edema with interval partial resolution of the opacification in the right upper lobe. Electronically Signed   By: Frazier Richards M.D.   On: 08/31/2021 08:33   DG CHEST PORT 1 VIEW  Result Date: 08/31/2021 CLINICAL DATA:  Shortness of breath.  Status post intubation EXAM: PORTABLE CHEST 1 VIEW COMPARISON:  09/03/2021 FINDINGS: Interval placement of ET tube with tip above the carina. There is an enteric tube in place, the distal portion of the tube cannot be followed below the level of the GE junction, limited by portable technique. Interval decrease in lung volumes. A new right pleural effusion is suspected. There is progressive opacification within the right upper lobe. The previous left mid lung opacity is limited by overlying pacer lead. IMPRESSION: 1. Interval placement of ET tube with tip above the carina. 2. Worsening aeration to the right upper lobe with probable new right pleural effusion. 3. Limited assessment of previous left mid lung opacity by overlying pacer lead. 4. Limited by portable technique and patient body habitus, I cannot confirm location of the distal portion of the enteric tube. Electronically Signed   By: Kerby Moors M.D.   On: 08/31/2021 07:18    DG Chest Portable 1 View  Result Date: 08/27/2021 CLINICAL DATA:  Shortness of breath and leg swelling for the past week. EXAM: PORTABLE CHEST 1 VIEW COMPARISON:  Chest x-ray dated March 05, 2021. FINDINGS: Unchanged mild cardiomegaly. Pulmonary vascular congestion. Small ill-defined opacities in the right lung. Peripheral consolidation in the left mid to lower lung. No pleural effusion or pneumothorax. No acute osseous abnormality. IMPRESSION: 1. Findings suspicious for multifocal pneumonia. Electronically Signed   By: Titus Dubin M.D.   On: 09/11/2021 18:05    Microbiology Recent Results (from the past 240 hour(s))  Respiratory (~20 pathogens) panel by PCR     Status: None   Collection Time: 08/30/21  7:42 AM   Specimen: Nasopharyngeal Swab; Respiratory  Result Value Ref Range Status   Adenovirus NOT DETECTED NOT DETECTED Final   Coronavirus 229E NOT DETECTED NOT DETECTED Final    Comment: (NOTE) The Coronavirus on the Respiratory Panel, DOES NOT test for the novel  Coronavirus (2019 nCoV)    Coronavirus HKU1 NOT DETECTED NOT DETECTED Final   Coronavirus NL63 NOT DETECTED NOT DETECTED Final   Coronavirus OC43 NOT DETECTED NOT DETECTED Final   Metapneumovirus NOT DETECTED NOT DETECTED Final   Rhinovirus / Enterovirus NOT DETECTED NOT DETECTED Final   Influenza A NOT DETECTED NOT DETECTED Final   Influenza B NOT DETECTED NOT DETECTED Final   Parainfluenza Virus 1 NOT DETECTED NOT DETECTED Final   Parainfluenza Virus 2 NOT DETECTED NOT DETECTED Final   Parainfluenza Virus 3 NOT DETECTED NOT DETECTED Final   Parainfluenza Virus 4 NOT DETECTED NOT DETECTED Final   Respiratory Syncytial Virus NOT DETECTED NOT DETECTED Final   Bordetella pertussis NOT DETECTED NOT DETECTED Final   Bordetella Parapertussis NOT DETECTED NOT DETECTED Final   Chlamydophila pneumoniae NOT DETECTED NOT DETECTED Final   Mycoplasma pneumoniae NOT DETECTED NOT DETECTED Final    Comment: Performed at [facility], [place]. [address], [place], [place] [address]  Culture, Respiratory w Gram Stain     Status: None   Collection Time: 08/31/21  5:48 PM   Specimen: Tracheal Aspirate; Respiratory  Result Value Ref Range Status   Specimen  Description TRACHEAL ASPIRATE  Final   Special Requests NONE  Final   Gram Stain   Final    FEW WBC PRESENT,BOTH PMN AND MONONUCLEAR FEW GRAM POSITIVE COCCI IN PAIRS AND CHAINS    Culture   Final    FEW Consistent with normal respiratory flora. Performed at [facility], [place] [address], [place], [place] [address]    Report Status 09/03/2021 FINAL  Final  Culture, Respiratory w Gram Stain     Status: None   Collection Time: 09/01/21  7:53 AM   Specimen: Tracheal Aspirate; Respiratory  Result Value Ref Range Status   Specimen Description TRACHEAL ASPIRATE  Final   Special Requests NONE  Final   Gram Stain   Final    RARE WBC PRESENT, PREDOMINANTLY MONONUCLEAR RARE GRAM NEGATIVE RODS RARE GRAM POSITIVE COCCI IN PAIRS    Culture   Final    RARE Consistent with normal respiratory flora. Performed at [facility], [place] [address], [place], [place] [address]    Report Status 09/03/2021 FINAL  Final  MRSA Next Gen by PCR, Nasal     Status: None   Collection Time: 09/02/21  8:26 AM   Specimen: Nasal Mucosa; Nasal Swab  Result Value Ref Range Status   MRSA by PCR Next Gen NOT DETECTED NOT DETECTED Final    Comment: (NOTE) The GeneXpert MRSA Assay (FDA approved for NASAL specimens only), is one component of a comprehensive MRSA colonization surveillance program. It is not intended to diagnose MRSA infection nor to guide or monitor treatment for MRSA infections. Test performance is not FDA approved in patients less than 28 years old. Performed at [facility], [place] [address] [place], [place] [address]   Culture, blood (Routine X 2) w Reflex to ID Panel     Status: None   Collection Time: 09/02/21 10:50 AM   Specimen: BLOOD  Result  Value Ref Range Status   Specimen Description BLOOD THUMB  Final   Special Requests   Final    BOTTLES DRAWN AEROBIC AND ANAEROBIC Blood Culture adequate volume   Culture   Final    NO GROWTH 5 DAYS Performed at [facility], [place] [address] [place], [place] [address]    Report Status 09/07/2021 FINAL  Final  Culture, blood (Routine X 2) w Reflex to ID Panel     Status: None   Collection Time: 09/02/21 11:05 AM   Specimen: BLOOD RIGHT HAND  Result Value Ref Range Status   Specimen Description BLOOD RIGHT HAND  Final   Special Requests   Final    BOTTLES DRAWN AEROBIC AND ANAEROBIC Blood Culture adequate volume   Culture   Final    NO GROWTH 5 DAYS Performed at [facility], [place] [address], [place], [place] [address]    Report Status 09/07/2021 FINAL  Final  Respiratory (~20 pathogens) panel by PCR     Status: None   Collection Time: 09/03/21 11:15 AM   Specimen: Nasopharyngeal Swab; Respiratory  Result Value Ref Range Status   Adenovirus NOT DETECTED NOT DETECTED Final   Coronavirus 229E NOT DETECTED NOT DETECTED Final    Comment: (NOTE) The Coronavirus on the Respiratory Panel, DOES NOT test for the novel  Coronavirus (2019 nCoV)    Coronavirus HKU1 NOT DETECTED NOT DETECTED Final   Coronavirus NL63 NOT DETECTED NOT DETECTED Final   Coronavirus OC43 NOT DETECTED NOT DETECTED Final   Metapneumovirus NOT DETECTED NOT DETECTED  Final   Rhinovirus / Enterovirus NOT DETECTED NOT DETECTED Final   Influenza A NOT DETECTED NOT DETECTED Final   Influenza B NOT DETECTED NOT DETECTED Final   Parainfluenza Virus 1 NOT DETECTED NOT DETECTED Final   Parainfluenza Virus 2 NOT DETECTED NOT DETECTED Final   Parainfluenza Virus 3 NOT DETECTED NOT DETECTED Final   Parainfluenza Virus 4 NOT DETECTED NOT DETECTED Final   Respiratory Syncytial Virus NOT DETECTED NOT DETECTED Final   Bordetella pertussis NOT DETECTED NOT DETECTED Final   Bordetella Parapertussis NOT DETECTED NOT  DETECTED Final   Chlamydophila pneumoniae NOT DETECTED NOT DETECTED Final   Mycoplasma pneumoniae NOT DETECTED NOT DETECTED Final    Comment: Performed at [facility], [place] [address] [place], [place] [address]    Lab Basic Metabolic Panel: Recent Labs  Lab 09/02/21 1050 09/02/21 2243 09/02/21 2246 09/04/21 1535 09/05/21 0429 09/05/21 0438 09/06/21 0407 09/06/21 0413 09/07/21 0325 09/07/21 0329 Sep 17, 2021 0359 17-Sep-2021 1257  NA  --  146*   < > 147* 146*   < > 151* 152* 155* 157*  --  160*  K  --  3.7   < > 4.4 4.4   < > 4.6 4.6 4.4 4.3  --  4.3  CL  --  100   < > 107 108  --  110  --  111  --   --  119*  CO2  --  34*   < > 33* 31  --  31  --  34*  --   --  33*  GLUCOSE  --  204*   < > 304* 284*  --  252*  --  171*  --   --  145*  BUN  --  43*   < > 57* 73*  --  84*  --  93*  --   --  109*  CREATININE  --  1.49*   < > 1.60* 1.53*  --  1.32*  --  1.27*  --   --  1.56*  CALCIUM  --  7.9*   < > 8.5* 8.7*  --  9.3  --  9.3  --   --  8.9  MG  --  2.4  --   --  3.7*  --  3.7*  --  3.4*  --  3.5*  --   PHOS 4.0  --   --   --   --   --   --   --   --   --   --   --    < > = values in this interval not displayed.   Liver Function Tests: Recent Labs  Lab 09/03/21 0505 2021/09/17 1257  AST 34 17  ALT 13 19  ALKPHOS 50 50  BILITOT 1.7* 1.6*  PROT 7.6 7.8  ALBUMIN 2.3* 2.6*   No results for input(s): "LIPASE", "AMYLASE" in the last 168 hours. No results for input(s): "AMMONIA" in the last 168 hours. CBC: Recent Labs  Lab 09/03/21 0505 09/03/21 0516 09/04/21 0408 09/04/21 0417 09/05/21 0429 09/05/21 0438 09/06/21 0407 09/06/21 0413 09/07/21 0329 09/07/21 1056  WBC 20.2*  --  12.8*  --  12.0*  --  10.9*  --   --  14.3*  HGB 14.6   < > 12.9*   < > 13.0 15.0 14.0 15.6 16.3 14.2  HCT 49.8   < > 44.8   < > 45.8 44.0 48.4 46.0 48.0 51.3  MCV 100.8*  --  102.8*  --  103.2*  --  102.8*  --   --  103.8*  PLT 181  --  176  --  193  --  223  --   --  237   < > = values in  this interval not displayed.   Cardiac Enzymes: No results for input(s): "CKTOTAL", "CKMB", "CKMBINDEX", "TROPONINI" in the last 168 hours. Sepsis Labs: Recent Labs  Lab 09/03/21 0505 09/04/21 0408 09/05/21 0429 09/06/21 0407 09/07/21 1056  PROCALCITON 12.66 8.98  --   --   --   WBC 20.2* 12.8* 12.0* 10.9* 14.3*

## 2021-09-22 NOTE — Progress Notes (Signed)
NAME:  Joe Carter, MRN:  [id number], DOB:  November 15, 1980, LOS: 47 ADMISSION DATE:  09/03/2021, CONSULTATION DATE:  09/02/2021 REFERRING MD:  Caryl Ada PA-C/ ED, CHIEF COMPLAINT:  acute respiratory failure   History of Present Illness:  Joe Carter is a 41 year old gentleman with a history of OHS, OSA, chronic respiratory failure on 3 L supplemental oxygen who presented to internal medicine clinic with dyspnea on exertion, orthopnea, worsening edema.  He had been restarted on his Lasix in May 2023 due to severe lower extremity edema.  He had not been taking this due to frequent urination that prohibit him from completing tasks at work.  In the internal medicine clinic he was saturating in the low 80s, not responsive to his home oxygen being resumed.  He was transferred to the ED.  Upon arrival he was increased supplemental oxygen, between nonrebreather for his saturations were in the 90s.  ABG with significant hypercapnia, so he was started on BiPAP.  Earlier he ripped off BiPAP and removed his IVs, attempting to leave.  When he pulled off his oxygen he desatted to the 80s again and started to feel poorly.  Currently he has BiPAP back on.  Due to chest x-ray findings concerning for multifocal pneumonia he was given ceftriaxone and azithromycin.  He was given a dose of Lasix in the ED.  PCCM was consulted for evaluation of hypoxic and hypercapnic respiratory failure.  In December 2022 he was admitted for similar presentation requiring intubation and mechanical ventilation.  Pertinent  Medical History  OSA, OHS Chronic respiratory failure HFpEF  Significant Hospital Events: Including procedures, antibiotic start and stop dates in addition to other pertinent events   6/7 admission to the ICU 6/8 intubated.  6/9 still hypoxic - started on epoprostenol 6/10 fevers O2 still marginal 6/11 onwards: continued high vent needs 6/14 sats improved to high 80s  6/15 tried decr sedation and SIMV/PRVC 6/16 CXR  worse ARDS. Uncomfortable breathing w sedation wean. Incr sedation and asking palli to help coordinate family mtg. Spiking fevers,   Interim History / Subjective:  Overnight still on epo. Ongoing fevers. Needed mophine addition for some comfort. Still on versed, fentanyl and precedex. Still severely hypoxemic.   Objective   Blood pressure (!) 93/55, pulse 94, temperature (!) 102.2 F (39 C), resp. rate (!) 22, height 6' 5" (1.956 m), weight (!) 217.2 kg, SpO2 95 %. CVP:  [8 mmHg-14 mmHg] 11 mmHg  Vent Mode: SIMV;PRVC FiO2 (%):  [100 %] 100 % Set Rate:  [16 bmp] 16 bmp Vt Set:  [620 mL] 620 mL PEEP:  [18 cmH20] 18 cmH20 Pressure Support:  [12 cmH20] 12 cmH20 Plateau Pressure:  [29 cmH20-38 cmH20] 38 cmH20   Intake/Output Summary (Last 24 hours) at 02-Oct-2021 0918 Last data filed at 2021/10/02 0400 Gross per 24 hour  Intake 2266.45 ml  Output 3603 ml  Net -1336.55 ml   Filed Weights   09/06/21 0500 09/07/21 0500 October 02, 2021 0400  Weight: (!) 216.7 kg (!) 214.6 kg (!) 217.2 kg    Assessment & Plan:   Acute on chronic respiratory failure w hypoxemia and hypercarbia Severe ARDS, worse  PNA, atelectasis, pulmonary edema -fragile baseline cardiopulmonary status due to weight and cor pulmonale.  He also seems to have chronic atelectasis either from old infections or weight.  Resp culture normal flora.  RVP neg x 2.  His weight precludes safe proning.  -shunt physiology  P -remains on high vent settings despite several trials of different  modes/sedation.  -Cont epo (not weaning this until able to wean vent settings)  - late stage ARDS so NMB no longer helpful.  - fevering despite broad broad spectrum thearpy and broadening to meropenem - suspect drug fever -hold diuresis 6/16 given hypernatremia.   AKI  Hypernatremia  P -trend renal indices uop   Hyperglycemia  P -levemir + rSSI  Hx cocaine/crack use -cessation counseling when appropriate. Hugely detrimental for already tenuous  baseline lung status   GOC Discussed with family with the help of palliative care on 6/16. This is unfortunately not a surivivable illness despite all aggressive measures. Plan is for transition to full comfort care and compassionate extubation once family can come and say goodbye and are ready.  Morphine initiated for comfort and we are limiting some non-comfort therapies already. He is DNR. We are stopping labs.   Best Practice (right click and "Reselect all SmartList Selections" daily)   Diet/type: tubefeeds DVT prophylaxis: LMWH GI prophylaxis: PPI Lines: Central line, Arterial Line, and yes and it is still needed Foley:  Yes, and it is still needed Code Status:  full code Last date of multidisciplinary goals of care discussion [see above]  CRITICAL CARE The patient is critically ill due to respiratory failure.  Critical care was necessary to treat or prevent imminent or life-threatening deterioration.  Critical care was time spent personally by me on the following activities: development of treatment plan with patient and/or surrogate as well as nursing, discussions with consultants, evaluation of patient's response to treatment, examination of patient, obtaining history from patient or surrogate, ordering and performing treatments and interventions, ordering and review of laboratory studies, ordering and review of radiographic studies, pulse oximetry, re-evaluation of patient's condition and participation in multidisciplinary rounds.   Critical Care Time devoted to patient care services described in this note is 45 minutes. This time reflects time of care of this [place] . This critical care time does not reflect separately billable procedures or procedure time, teaching time or supervisory time of PA/NP/Med student/Med Resident etc but could involve care discussion time.       Spero [facility]ds [facility] 2021-10-06 9:18 AM  Pager: see  AMION  If no response to pager , please call critical care on call (see AMION) until 7pm After 7:00 pm call Elink

## 2021-09-22 DEATH — deceased

## 2021-10-02 ENCOUNTER — Other Ambulatory Visit (HOSPITAL_COMMUNITY): Payer: Self-pay

## 2021-12-03 ENCOUNTER — Telehealth: Payer: Self-pay | Admitting: *Deleted

## 2021-12-03 DIAGNOSIS — J9601 Acute respiratory failure with hypoxia: Secondary | ICD-10-CM

## 2021-12-03 NOTE — Telephone Encounter (Signed)
Order written, can you fax to company?  Gilles Chiquito, MD

## 2021-12-03 NOTE — Telephone Encounter (Signed)
Mother in clinic this AM requesting an order to d/c home oxygen so that equipment can be removed from home.

## 2021-12-04 NOTE — Telephone Encounter (Signed)
CM sent to [place] at [place] on 12/03/21 to remove home oxygen.

## 2024-06-05 IMAGING — DX DG CHEST 1V PORT
1 series · 1 of 1 positions shown · non-contrast
Comparison: Chest radiograph from one day prior.

CLINICAL DATA: Respiratory failure

EXAM:
PORTABLE CHEST 1 VIEW

[chest]
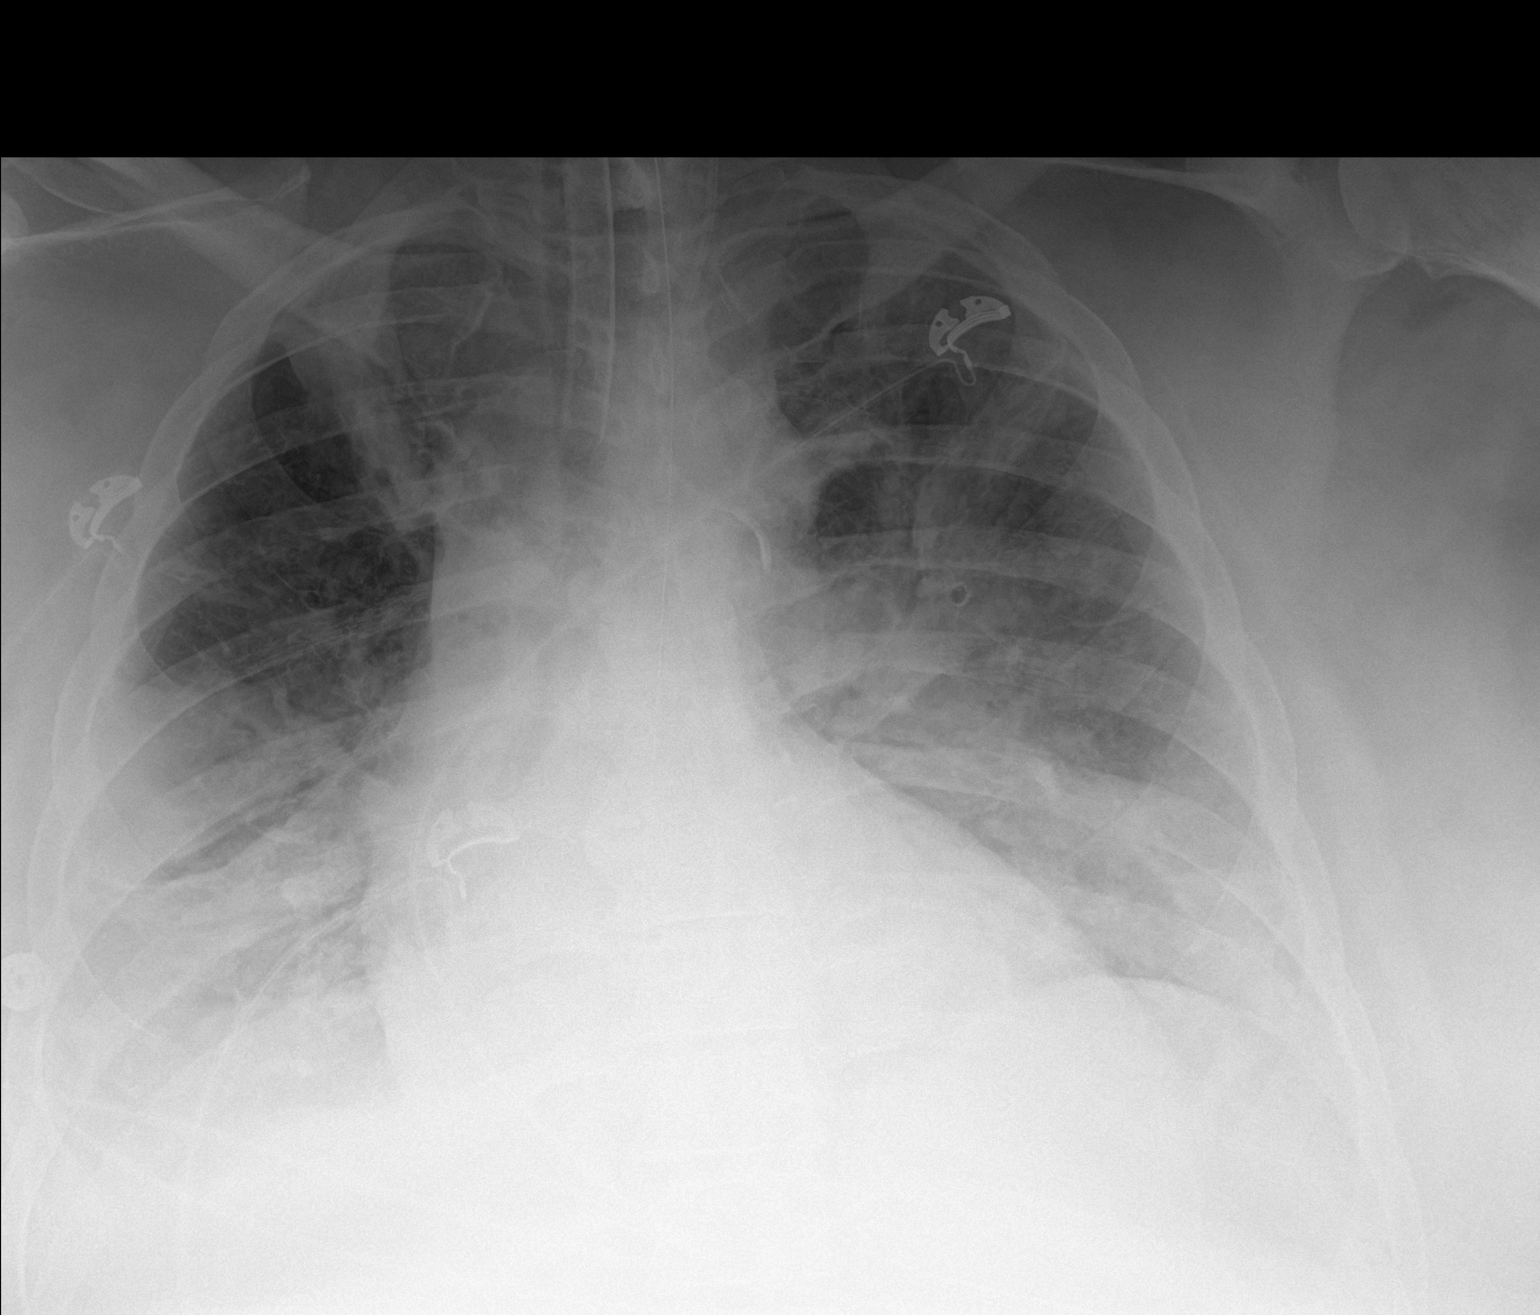

[1 of 1 positions shown; findings below may reference images not displayed]

FINDINGS: Endotracheal tube tip is 3.4 cm above the carina. Left internal
jugular central venous catheter terminates in the region of the AP
window, unchanged. Enteric tube enters stomach with the tip not seen
on this image. Stable cardiomediastinal silhouette with normal heart
size. No pneumothorax. Stable small right pleural effusion.
Extensive patchy opacity throughout the bilateral lungs, most
prominent in the lower lungs bilaterally, worsened on the right.
IMPRESSION: 1. Well-positioned endotracheal and enteric tubes.  No pneumothorax.
2. Left internal jugular central venous catheter terminates in
stable position in the region of the AP window in the left
mediastinum, presumably within an accessory left sided mediastinal
vein as seen on prior chest CT angiogram study.
3. Extensive patchy opacity throughout the bilateral lungs, most
prominent in the lower lungs bilaterally, worsened on the right,
compatible with multifocal pneumonia or ARDS.

## 2024-06-10 IMAGING — DX DG CHEST 1V PORT
1 series · 1 of 1 positions shown · non-contrast
Comparison: Portable chest 09/07/2021 and earlier.

CLINICAL DATA: 40-year-old male with ARDS.

EXAM:
PORTABLE CHEST 1 VIEW

[chest ap]
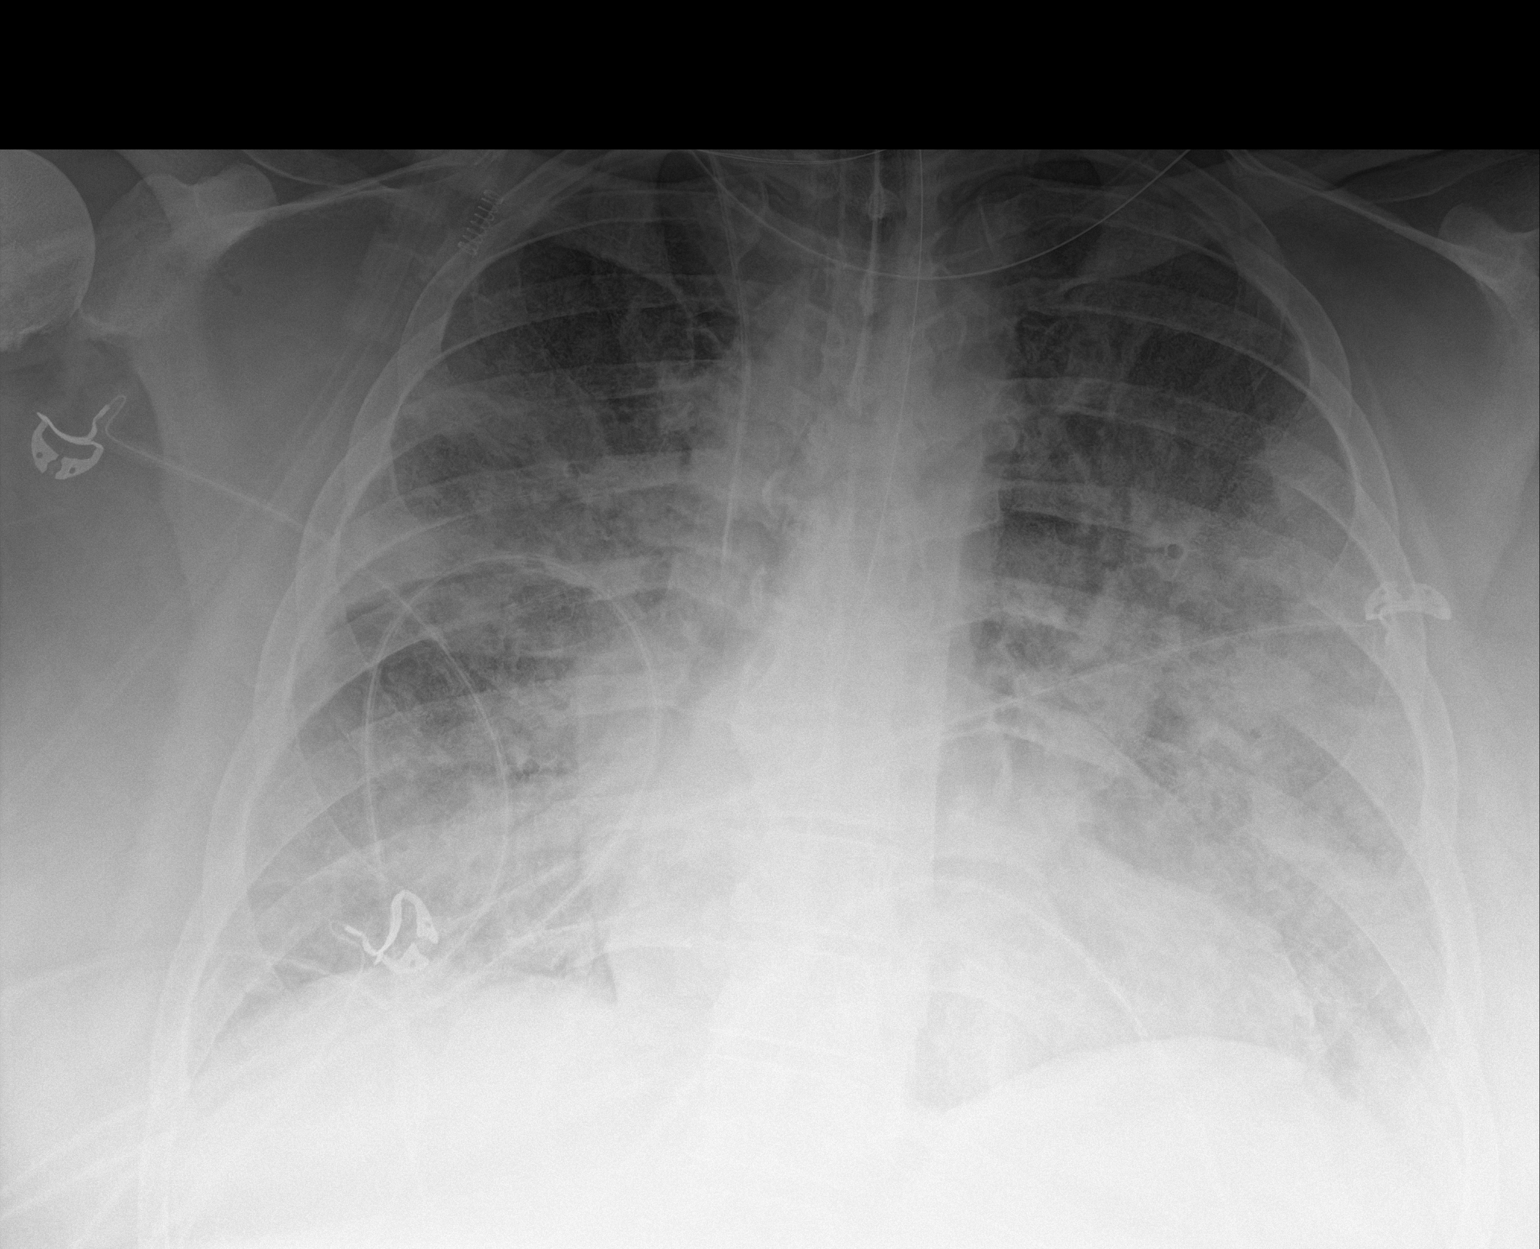

[1 of 1 positions shown; findings below may reference images not displayed]

FINDINGS: Portable AP semi upright view at 9192 hours. Stable lines and tubes.
Moderate to severe widespread bilateral pulmonary opacity, although
less confluent and more sub solid now compared to yesterday.
Improved visualization of mediastinal contours which seem to remain
normal. No pneumothorax or pleural effusion. No acute osseous
abnormality identified.
IMPRESSION: 1.  Stable lines and tubes.
2. ARDS with mildly improved ventilation since yesterday.
